# Patient Record
Sex: Female | Born: 1965
Health system: Southern US, Community
[De-identification: ages and names within clinical notes are randomized; demographics above are authoritative.]

## PROBLEM LIST (undated history)

## (undated) ENCOUNTER — Ambulatory Visit: Source: Home / Self Care

## (undated) DIAGNOSIS — O24419 Gestational diabetes mellitus in pregnancy, unspecified control: Secondary | ICD-10-CM

## (undated) DIAGNOSIS — Z8759 Personal history of other complications of pregnancy, childbirth and the puerperium: Secondary | ICD-10-CM

## (undated) DIAGNOSIS — E119 Type 2 diabetes mellitus without complications: Secondary | ICD-10-CM

## (undated) DIAGNOSIS — E785 Hyperlipidemia, unspecified: Secondary | ICD-10-CM

## (undated) DIAGNOSIS — I10 Essential (primary) hypertension: Secondary | ICD-10-CM

## (undated) HISTORY — DX: Personal history of other complications of pregnancy, childbirth and the puerperium: Z87.59

## (undated) HISTORY — DX: Essential (primary) hypertension: I10

## (undated) HISTORY — DX: Hyperlipidemia, unspecified: E78.5

## (undated) HISTORY — DX: Type 2 diabetes mellitus without complications: E11.9

## (undated) HISTORY — DX: Gestational diabetes mellitus in pregnancy, unspecified control: O24.419

---

## 1993-12-31 DIAGNOSIS — Z8759 Personal history of other complications of pregnancy, childbirth and the puerperium: Secondary | ICD-10-CM

## 1993-12-31 HISTORY — DX: Personal history of other complications of pregnancy, childbirth and the puerperium: Z87.59

## 1998-12-31 HISTORY — PX: CERVICAL BIOPSY: SHX590

## 1999-08-10 ENCOUNTER — Other Ambulatory Visit: Admission: RE | Admit: 1999-08-10 | Discharge: 1999-08-10 | Payer: Self-pay | Admitting: Obstetrics & Gynecology

## 1999-10-26 ENCOUNTER — Other Ambulatory Visit: Admission: RE | Admit: 1999-10-26 | Discharge: 1999-10-26 | Payer: Self-pay | Admitting: Obstetrics & Gynecology

## 1999-10-26 ENCOUNTER — Encounter (INDEPENDENT_AMBULATORY_CARE_PROVIDER_SITE_OTHER): Payer: Self-pay

## 2000-01-01 DIAGNOSIS — O24419 Gestational diabetes mellitus in pregnancy, unspecified control: Secondary | ICD-10-CM

## 2000-01-01 HISTORY — DX: Gestational diabetes mellitus in pregnancy, unspecified control: O24.419

## 2000-01-01 HISTORY — PX: TUBAL LIGATION: SHX77

## 2000-04-16 ENCOUNTER — Encounter: Admission: RE | Admit: 2000-04-16 | Discharge: 2000-07-15 | Payer: Self-pay | Admitting: Obstetrics & Gynecology

## 2000-04-17 ENCOUNTER — Encounter: Payer: Self-pay | Admitting: Obstetrics & Gynecology

## 2000-04-17 ENCOUNTER — Inpatient Hospital Stay (HOSPITAL_COMMUNITY): Admission: AD | Admit: 2000-04-17 | Discharge: 2000-04-17 | Payer: Self-pay | Admitting: Anesthesiology

## 2000-05-13 ENCOUNTER — Encounter: Payer: Self-pay | Admitting: Obstetrics & Gynecology

## 2000-05-13 ENCOUNTER — Observation Stay (HOSPITAL_COMMUNITY): Admission: AD | Admit: 2000-05-13 | Discharge: 2000-05-14 | Payer: Self-pay | Admitting: Obstetrics & Gynecology

## 2000-05-17 ENCOUNTER — Inpatient Hospital Stay (HOSPITAL_COMMUNITY): Admission: AD | Admit: 2000-05-17 | Discharge: 2000-05-17 | Payer: Self-pay | Admitting: Obstetrics & Gynecology

## 2000-05-20 ENCOUNTER — Inpatient Hospital Stay (HOSPITAL_COMMUNITY): Admission: AD | Admit: 2000-05-20 | Discharge: 2000-05-20 | Payer: Self-pay | Admitting: Obstetrics & Gynecology

## 2000-05-23 ENCOUNTER — Inpatient Hospital Stay (HOSPITAL_COMMUNITY): Admission: AD | Admit: 2000-05-23 | Discharge: 2000-05-23 | Payer: Self-pay | Admitting: Obstetrics & Gynecology

## 2000-05-23 ENCOUNTER — Encounter: Payer: Self-pay | Admitting: Obstetrics & Gynecology

## 2000-05-27 ENCOUNTER — Encounter (HOSPITAL_COMMUNITY): Admission: RE | Admit: 2000-05-27 | Discharge: 2000-06-15 | Payer: Self-pay | Admitting: Obstetrics & Gynecology

## 2000-05-31 ENCOUNTER — Inpatient Hospital Stay (HOSPITAL_COMMUNITY): Admission: AD | Admit: 2000-05-31 | Discharge: 2000-05-31 | Payer: Self-pay | Admitting: Obstetrics & Gynecology

## 2000-06-14 ENCOUNTER — Inpatient Hospital Stay (HOSPITAL_COMMUNITY): Admission: AD | Admit: 2000-06-14 | Discharge: 2000-06-16 | Payer: Self-pay | Admitting: Obstetrics and Gynecology

## 2000-06-14 ENCOUNTER — Encounter (INDEPENDENT_AMBULATORY_CARE_PROVIDER_SITE_OTHER): Payer: Self-pay

## 2000-08-26 ENCOUNTER — Other Ambulatory Visit: Admission: RE | Admit: 2000-08-26 | Discharge: 2000-08-26 | Payer: Self-pay | Admitting: Obstetrics & Gynecology

## 2003-04-06 ENCOUNTER — Other Ambulatory Visit: Admission: RE | Admit: 2003-04-06 | Discharge: 2003-04-06 | Payer: Self-pay | Admitting: *Deleted

## 2006-03-25 ENCOUNTER — Ambulatory Visit: Payer: Self-pay | Admitting: Family Medicine

## 2006-04-03 ENCOUNTER — Ambulatory Visit: Payer: Self-pay | Admitting: Family Medicine

## 2006-04-03 ENCOUNTER — Other Ambulatory Visit: Admission: RE | Admit: 2006-04-03 | Discharge: 2006-04-03 | Payer: Self-pay | Admitting: Family Medicine

## 2006-04-03 ENCOUNTER — Encounter: Payer: Self-pay | Admitting: Family Medicine

## 2006-04-09 ENCOUNTER — Encounter: Admission: RE | Admit: 2006-04-09 | Discharge: 2006-04-09 | Payer: Self-pay | Admitting: Family Medicine

## 2006-05-06 ENCOUNTER — Encounter: Admission: RE | Admit: 2006-05-06 | Discharge: 2006-05-06 | Payer: Self-pay | Admitting: Family Medicine

## 2007-02-07 ENCOUNTER — Ambulatory Visit: Payer: Self-pay | Admitting: Family Medicine

## 2007-02-07 LAB — CONVERTED CEMR LAB
BUN: 8 mg/dL (ref 6–23)
Basophils Absolute: 0 10*3/uL (ref 0.0–0.1)
Basophils Relative: 0.7 % (ref 0.0–1.0)
Calcium: 9.5 mg/dL (ref 8.4–10.5)
Chloride: 105 meq/L (ref 96–112)
Creatinine, Ser: 0.8 mg/dL (ref 0.4–1.2)
GFR calc Af Amer: 102 mL/min
GFR calc non Af Amer: 84 mL/min
Glucose, Bld: 82 mg/dL (ref 70–99)
HCT: 38.9 % (ref 36.0–46.0)
Hemoglobin: 13 g/dL (ref 12.0–15.0)
MCV: 75.5 fL — ABNORMAL LOW (ref 78.0–100.0)
Monocytes Absolute: 0.5 10*3/uL (ref 0.2–0.7)
Neutrophils Relative %: 62.2 % (ref 43.0–77.0)
Potassium: 3.9 meq/L (ref 3.5–5.1)
RBC: 5.15 M/uL — ABNORMAL HIGH (ref 3.87–5.11)
RDW: 14.4 % (ref 11.5–14.6)
Sodium: 142 meq/L (ref 135–145)

## 2007-04-24 ENCOUNTER — Other Ambulatory Visit: Admission: RE | Admit: 2007-04-24 | Discharge: 2007-04-24 | Payer: Self-pay | Admitting: Family Medicine

## 2007-04-24 ENCOUNTER — Encounter: Payer: Self-pay | Admitting: Family Medicine

## 2007-04-24 ENCOUNTER — Ambulatory Visit: Payer: Self-pay | Admitting: Family Medicine

## 2007-04-29 ENCOUNTER — Ambulatory Visit: Payer: Self-pay | Admitting: Family Medicine

## 2007-04-29 LAB — CONVERTED CEMR LAB
ALT: 15 units/L (ref 0–40)
Alkaline Phosphatase: 69 units/L (ref 39–117)
BUN: 8 mg/dL (ref 6–23)
CO2: 28 meq/L (ref 19–32)
Calcium: 8.9 mg/dL (ref 8.4–10.5)
Creatinine, Ser: 0.9 mg/dL (ref 0.4–1.2)
GFR calc non Af Amer: 73 mL/min
Hemoglobin: 11.9 g/dL — ABNORMAL LOW (ref 12.0–15.0)
Lymphocytes Relative: 32.9 % (ref 12.0–46.0)
MCHC: 32.9 g/dL (ref 30.0–36.0)
Monocytes Absolute: 0.3 10*3/uL (ref 0.2–0.7)
Monocytes Relative: 7.8 % (ref 3.0–11.0)
Platelets: 244 10*3/uL (ref 150–400)
Sodium: 139 meq/L (ref 135–145)
Total Protein: 6.7 g/dL (ref 6.0–8.3)

## 2007-05-01 LAB — CONVERTED CEMR LAB: Pap Smear: NORMAL

## 2007-05-13 ENCOUNTER — Encounter: Admission: RE | Admit: 2007-05-13 | Discharge: 2007-05-13 | Payer: Self-pay | Admitting: Family Medicine

## 2008-05-13 ENCOUNTER — Encounter: Admission: RE | Admit: 2008-05-13 | Discharge: 2008-05-13 | Payer: Self-pay | Admitting: Family Medicine

## 2008-05-17 ENCOUNTER — Encounter (INDEPENDENT_AMBULATORY_CARE_PROVIDER_SITE_OTHER): Payer: Self-pay | Admitting: *Deleted

## 2008-07-20 ENCOUNTER — Ambulatory Visit: Payer: Self-pay | Admitting: *Deleted

## 2008-07-20 DIAGNOSIS — S300XXA Contusion of lower back and pelvis, initial encounter: Secondary | ICD-10-CM | POA: Insufficient documentation

## 2008-08-27 ENCOUNTER — Encounter: Payer: Self-pay | Admitting: Family Medicine

## 2008-08-27 ENCOUNTER — Ambulatory Visit: Payer: Self-pay | Admitting: Family Medicine

## 2008-08-27 ENCOUNTER — Other Ambulatory Visit: Admission: RE | Admit: 2008-08-27 | Discharge: 2008-08-27 | Payer: Self-pay | Admitting: Family Medicine

## 2008-08-27 DIAGNOSIS — J309 Allergic rhinitis, unspecified: Secondary | ICD-10-CM | POA: Insufficient documentation

## 2008-08-30 ENCOUNTER — Ambulatory Visit: Payer: Self-pay | Admitting: Family Medicine

## 2008-08-30 DIAGNOSIS — J019 Acute sinusitis, unspecified: Secondary | ICD-10-CM | POA: Insufficient documentation

## 2008-08-31 ENCOUNTER — Encounter (INDEPENDENT_AMBULATORY_CARE_PROVIDER_SITE_OTHER): Payer: Self-pay | Admitting: *Deleted

## 2008-09-09 ENCOUNTER — Encounter (INDEPENDENT_AMBULATORY_CARE_PROVIDER_SITE_OTHER): Payer: Self-pay | Admitting: *Deleted

## 2008-09-14 ENCOUNTER — Telehealth (INDEPENDENT_AMBULATORY_CARE_PROVIDER_SITE_OTHER): Payer: Self-pay | Admitting: *Deleted

## 2009-01-25 ENCOUNTER — Ambulatory Visit: Payer: Self-pay | Admitting: Family Medicine

## 2009-01-25 DIAGNOSIS — R059 Cough, unspecified: Secondary | ICD-10-CM | POA: Insufficient documentation

## 2009-01-25 DIAGNOSIS — R05 Cough: Secondary | ICD-10-CM

## 2009-01-26 ENCOUNTER — Ambulatory Visit: Payer: Self-pay | Admitting: Family Medicine

## 2009-01-27 ENCOUNTER — Telehealth (INDEPENDENT_AMBULATORY_CARE_PROVIDER_SITE_OTHER): Payer: Self-pay | Admitting: *Deleted

## 2009-02-25 ENCOUNTER — Ambulatory Visit: Payer: Self-pay | Admitting: Family Medicine

## 2009-02-25 ENCOUNTER — Encounter (INDEPENDENT_AMBULATORY_CARE_PROVIDER_SITE_OTHER): Payer: Self-pay | Admitting: *Deleted

## 2009-03-16 ENCOUNTER — Ambulatory Visit: Payer: Self-pay | Admitting: Pulmonary Disease

## 2009-04-29 ENCOUNTER — Ambulatory Visit: Payer: Self-pay | Admitting: Family Medicine

## 2009-04-29 ENCOUNTER — Encounter: Payer: Self-pay | Admitting: Family Medicine

## 2009-04-29 ENCOUNTER — Other Ambulatory Visit: Admission: RE | Admit: 2009-04-29 | Discharge: 2009-04-29 | Payer: Self-pay | Admitting: Family Medicine

## 2009-05-02 ENCOUNTER — Encounter (INDEPENDENT_AMBULATORY_CARE_PROVIDER_SITE_OTHER): Payer: Self-pay | Admitting: *Deleted

## 2009-05-05 ENCOUNTER — Encounter (INDEPENDENT_AMBULATORY_CARE_PROVIDER_SITE_OTHER): Payer: Self-pay | Admitting: *Deleted

## 2009-06-16 ENCOUNTER — Telehealth (INDEPENDENT_AMBULATORY_CARE_PROVIDER_SITE_OTHER): Payer: Self-pay | Admitting: *Deleted

## 2009-06-17 ENCOUNTER — Encounter: Admission: RE | Admit: 2009-06-17 | Discharge: 2009-06-17 | Payer: Self-pay | Admitting: Family Medicine

## 2009-11-04 ENCOUNTER — Ambulatory Visit: Payer: Self-pay | Admitting: Family Medicine

## 2010-05-16 ENCOUNTER — Ambulatory Visit: Payer: Self-pay | Admitting: Family Medicine

## 2010-05-16 DIAGNOSIS — F4310 Post-traumatic stress disorder, unspecified: Secondary | ICD-10-CM | POA: Insufficient documentation

## 2010-06-01 ENCOUNTER — Encounter (INDEPENDENT_AMBULATORY_CARE_PROVIDER_SITE_OTHER): Payer: Self-pay | Admitting: *Deleted

## 2010-06-01 ENCOUNTER — Ambulatory Visit: Payer: Self-pay | Admitting: Family Medicine

## 2010-06-19 ENCOUNTER — Ambulatory Visit: Payer: Self-pay | Admitting: Family Medicine

## 2010-06-19 ENCOUNTER — Encounter: Admission: RE | Admit: 2010-06-19 | Discharge: 2010-06-19 | Payer: Self-pay | Admitting: Family Medicine

## 2010-06-19 ENCOUNTER — Other Ambulatory Visit: Admission: RE | Admit: 2010-06-19 | Discharge: 2010-06-19 | Payer: Self-pay | Admitting: Family Medicine

## 2010-06-19 DIAGNOSIS — R7309 Other abnormal glucose: Secondary | ICD-10-CM | POA: Insufficient documentation

## 2010-06-19 DIAGNOSIS — E1165 Type 2 diabetes mellitus with hyperglycemia: Secondary | ICD-10-CM | POA: Insufficient documentation

## 2010-06-19 LAB — CONVERTED CEMR LAB: Hgb A1c MFr Bld: 7.4 % — ABNORMAL HIGH (ref 4.6–6.5)

## 2010-06-20 ENCOUNTER — Ambulatory Visit: Payer: Self-pay | Admitting: Family Medicine

## 2010-06-22 ENCOUNTER — Encounter (INDEPENDENT_AMBULATORY_CARE_PROVIDER_SITE_OTHER): Payer: Self-pay | Admitting: *Deleted

## 2010-06-22 ENCOUNTER — Telehealth (INDEPENDENT_AMBULATORY_CARE_PROVIDER_SITE_OTHER): Payer: Self-pay | Admitting: *Deleted

## 2010-06-22 LAB — CONVERTED CEMR LAB: Pap Smear: NEGATIVE

## 2010-06-28 ENCOUNTER — Telehealth (INDEPENDENT_AMBULATORY_CARE_PROVIDER_SITE_OTHER): Payer: Self-pay | Admitting: *Deleted

## 2010-07-23 ENCOUNTER — Emergency Department (HOSPITAL_BASED_OUTPATIENT_CLINIC_OR_DEPARTMENT_OTHER): Admission: EM | Admit: 2010-07-23 | Discharge: 2010-07-23 | Payer: Self-pay | Admitting: Emergency Medicine

## 2010-07-24 ENCOUNTER — Ambulatory Visit: Payer: Self-pay | Admitting: Family Medicine

## 2010-09-21 ENCOUNTER — Ambulatory Visit: Payer: Self-pay | Admitting: Family Medicine

## 2010-09-21 DIAGNOSIS — I10 Essential (primary) hypertension: Secondary | ICD-10-CM | POA: Insufficient documentation

## 2010-09-22 LAB — CONVERTED CEMR LAB
AST: 21 units/L (ref 0–37)
Alkaline Phosphatase: 85 units/L (ref 39–117)
BUN: 10 mg/dL (ref 6–23)
Bilirubin, Direct: 0.1 mg/dL (ref 0.0–0.3)
CO2: 24 meq/L (ref 19–32)
Calcium: 9.5 mg/dL (ref 8.4–10.5)
Cholesterol: 262 mg/dL — ABNORMAL HIGH (ref 0–200)
Creatinine, Ser: 1 mg/dL (ref 0.4–1.2)
Direct LDL: 207.4 mg/dL
HDL: 51.9 mg/dL (ref >39.00)
Microalb, Ur: 2.2 mg/dL — ABNORMAL HIGH (ref 0.0–1.9)
Potassium: 4.4 meq/L (ref 3.5–5.1)
Sodium: 142 meq/L (ref 135–145)
Total Bilirubin: 0.1 mg/dL — ABNORMAL LOW (ref 0.3–1.2)
Total CHOL/HDL Ratio: 5
Total Protein: 7.3 g/dL (ref 6.0–8.3)
Triglycerides: 91 mg/dL (ref 0.0–149.0)
VLDL: 18.2 mg/dL (ref 0.0–40.0)

## 2010-09-25 ENCOUNTER — Telehealth (INDEPENDENT_AMBULATORY_CARE_PROVIDER_SITE_OTHER): Payer: Self-pay | Admitting: *Deleted

## 2010-09-29 ENCOUNTER — Telehealth: Payer: Self-pay | Admitting: Family Medicine

## 2010-10-16 ENCOUNTER — Telehealth: Payer: Self-pay | Admitting: Family Medicine

## 2010-11-08 ENCOUNTER — Telehealth (INDEPENDENT_AMBULATORY_CARE_PROVIDER_SITE_OTHER): Payer: Self-pay | Admitting: *Deleted

## 2010-11-21 ENCOUNTER — Encounter: Payer: Self-pay | Admitting: Family Medicine

## 2010-12-19 ENCOUNTER — Ambulatory Visit: Payer: Self-pay | Admitting: Family Medicine

## 2010-12-19 DIAGNOSIS — J069 Acute upper respiratory infection, unspecified: Secondary | ICD-10-CM | POA: Insufficient documentation

## 2010-12-19 LAB — HM DIABETES FOOT EXAM

## 2010-12-26 ENCOUNTER — Telehealth (INDEPENDENT_AMBULATORY_CARE_PROVIDER_SITE_OTHER): Payer: Self-pay | Admitting: *Deleted

## 2010-12-26 LAB — CONVERTED CEMR LAB
Albumin: 3.9 g/dL (ref 3.5–5.2)
Cholesterol: 177 mg/dL (ref 0–200)
Creatinine, Ser: 0.9 mg/dL (ref 0.4–1.2)
Creatinine,U: 145.6 mg/dL
GFR calc non Af Amer: 87.14 mL/min (ref >60.00)
Glucose, Bld: 143 mg/dL — ABNORMAL HIGH (ref 70–99)
HDL: 50.6 mg/dL (ref >39.00)
Hgb A1c MFr Bld: 7.5 % — ABNORMAL HIGH (ref 4.6–6.5)
Microalb Creat Ratio: 0.3 mg/g (ref 0.0–30.0)
Potassium: 4.3 meq/L (ref 3.5–5.1)
Total Bilirubin: 0.5 mg/dL (ref 0.3–1.2)

## 2010-12-27 ENCOUNTER — Ambulatory Visit
Admission: RE | Admit: 2010-12-27 | Discharge: 2010-12-27 | Payer: Self-pay | Source: Home / Self Care | Attending: Internal Medicine | Admitting: Internal Medicine

## 2010-12-27 ENCOUNTER — Ambulatory Visit (HOSPITAL_BASED_OUTPATIENT_CLINIC_OR_DEPARTMENT_OTHER)
Admission: RE | Admit: 2010-12-27 | Discharge: 2010-12-27 | Payer: Self-pay | Source: Home / Self Care | Attending: Internal Medicine | Admitting: Internal Medicine

## 2011-01-09 ENCOUNTER — Ambulatory Visit
Admission: RE | Admit: 2011-01-09 | Discharge: 2011-01-09 | Payer: Self-pay | Source: Home / Self Care | Attending: Family Medicine | Admitting: Family Medicine

## 2011-01-09 DIAGNOSIS — L02519 Cutaneous abscess of unspecified hand: Secondary | ICD-10-CM | POA: Insufficient documentation

## 2011-01-09 DIAGNOSIS — L03019 Cellulitis of unspecified finger: Secondary | ICD-10-CM

## 2011-01-21 ENCOUNTER — Encounter: Payer: Self-pay | Admitting: Family Medicine

## 2011-01-22 ENCOUNTER — Encounter: Payer: Self-pay | Admitting: Family Medicine

## 2011-01-28 LAB — CONVERTED CEMR LAB
ALT: 20 units/L (ref 0–35)
ALT: 22 units/L (ref 0–35)
AST: 22 units/L (ref 0–37)
Alkaline Phosphatase: 86 units/L (ref 39–117)
Alkaline Phosphatase: 87 units/L (ref 39–117)
BUN: 10 mg/dL (ref 6–23)
BUN: 11 mg/dL (ref 6–23)
Basophils Relative: 0.5 % (ref 0.0–3.0)
Basophils Relative: 0.9 % (ref 0.0–3.0)
Bilirubin, Direct: 0 mg/dL (ref 0.0–0.3)
CO2: 28 meq/L (ref 19–32)
Calcium: 9.4 mg/dL (ref 8.4–10.5)
Chloride: 105 meq/L (ref 96–112)
Cholesterol: 235 mg/dL — ABNORMAL HIGH (ref 0–200)
Cholesterol: 246 mg/dL (ref 0–200)
Creatinine, Ser: 0.8 mg/dL (ref 0.4–1.2)
Direct LDL: 165.8 mg/dL
Direct LDL: 173.3 mg/dL
Direct LDL: 192 mg/dL
Eosinophils Absolute: 0.2 10*3/uL (ref 0.0–0.7)
Eosinophils Relative: 2.4 % (ref 0.0–5.0)
GFR calc Af Amer: 88 mL/min
GFR calc non Af Amer: 73 mL/min
Glucose, Bld: 109 mg/dL — ABNORMAL HIGH (ref 70–99)
Glucose, Urine, Semiquant: NEGATIVE
HCT: 38.4 % (ref 36.0–46.0)
HDL: 42.9 mg/dL (ref >39.0)
HDL: 55.8 mg/dL (ref >39.00)
Hemoglobin: 12.7 g/dL (ref 12.0–15.0)
Hemoglobin: 12.8 g/dL (ref 12.0–15.0)
Hemoglobin: 13.3 g/dL (ref 12.0–15.0)
Lymphocytes Relative: 31.4 % (ref 12.0–46.0)
MCHC: 33.4 g/dL (ref 30.0–36.0)
MCV: 77 fL — ABNORMAL LOW (ref 78.0–100.0)
MCV: 77.6 fL — ABNORMAL LOW (ref 78.0–100.0)
Monocytes Absolute: 0.5 10*3/uL (ref 0.1–1.0)
Monocytes Relative: 14.6 % — ABNORMAL HIGH (ref 3.0–12.0)
Monocytes Relative: 8.9 % (ref 3.0–12.0)
Neutro Abs: 2 10*3/uL (ref 1.4–7.7)
Neutro Abs: 3.6 10*3/uL (ref 1.4–7.7)
Nitrite: NEGATIVE
Platelets: 209 10*3/uL (ref 150.0–400.0)
Platelets: 242 10*3/uL (ref 150.0–400.0)
Potassium: 4.1 meq/L (ref 3.5–5.1)
Potassium: 4.1 meq/L (ref 3.5–5.1)
Potassium: 4.2 meq/L (ref 3.5–5.1)
Protein, U semiquant: NEGATIVE
RBC: 4.97 M/uL (ref 3.87–5.11)
RBC: 4.99 M/uL (ref 3.87–5.11)
Sodium: 138 meq/L (ref 135–145)
Specific Gravity, Urine: 1.015
TSH: 2.02 microintl units/mL (ref 0.35–5.50)
TSH: 2.36 microintl units/mL (ref 0.35–5.50)
Total Bilirubin: 0.7 mg/dL (ref 0.3–1.2)
Total CHOL/HDL Ratio: 4
Total Protein: 7.4 g/dL (ref 6.0–8.3)
Total Protein: 7.5 g/dL (ref 6.0–8.3)
Triglycerides: 109 mg/dL (ref 0.0–149.0)
Triglycerides: 110 mg/dL (ref 0.0–149.0)
VLDL: 21.8 mg/dL (ref 0.0–40.0)
VLDL: 22 mg/dL (ref 0–40)
WBC: 6.4 10*3/uL (ref 4.5–10.5)
pH: 6.5

## 2011-01-30 NOTE — Letter (Signed)
Summary: [facility]: Immunoassay Fecal Occult Blood (iFOB) Order Form  [facility] [place]  [address] [place], [place] [address]   Phone: [phone]  Fax: [phone]      [facility]: Immunoassay Fecal Occult Blood (iFOB) Order Form   June 01, 2010 MRN: [id number]   Paige Fleming January 11, 1966   Physicican Name:______yvonne lowne,do___________________  Diagnosis Code:_______v76.51___________________      Army Fossa CMA

## 2011-01-30 NOTE — Letter (Signed)
Summary: [facility]  [facility]   Imported By: Lanelle Bal 12/01/2010 09:08:51  _____________________________________________________________________  External Attachment:    Type:   Image     Comment:   External Document  Appended Document: [facility]     Allergies: No Known Drug Allergies  Diabetes Management Exam:    Eye Exam:       Eye Exam done elsewhere          Date: 11/21/2010          Results: normal          Done by: [facility]   Complete Medication List: 1)  Zyrtec Allergy 10 Mg Tabs (Cetirizine hcl) .... As needed. 2)  Optivar 0.05 % Soln (Azelastine hcl) .Marland Kitchen.. 1 gtt two times a day as needed 3)  Astepro 0.15 % Soln (Azelastine hcl) 4)  Onetouch Delica Lancets Misc (Lancets) .... Accu check once daily 5)  One Touch Ultra Mini Strips  .... Accu check once daily 6)  Singulair 10 Mg Tabs (Montelukast sodium) .... Take one tablet daily as needed. 7)  Lisinopril 10 Mg Tabs (Lisinopril) .Marland Kitchen.. 1 by mouth once daily 8)  Glucophage Xr 500 Mg Xr24h-tab (Metformin hcl) .Marland Kitchen.. 1 by mouth at bedtime 9)  Crestor 10 Mg Tabs (Rosuvastatin calcium) .Marland Kitchen.. 1 by mouth daily at bedtime 10)  Flonase 50 Mcg/act Susp (Fluticasone propionate) .... 2 sprays in each nostril once daily

## 2011-01-30 NOTE — Assessment & Plan Note (Signed)
Summary: rto 3 months- lab/cbs   Vital Signs:  Patient profile:   45 year old female Height:      64 inches Weight:      172.6 pounds Temp:     98.2 degrees F oral Pulse rate:   84 / minute Pulse rhythm:   regular BP sitting:   130 / 88  (right arm)  Vitals Entered By: Almeta Monas CMA Duncan Dull) (September 21, 2010 8:27 AM) CC: 3 mo f/u with fasting labs   History of Present Illness:  Hypertension follow-up      This is a 45 year old woman who presents for Hypertension follow-up.  The patient denies lightheadedness, urinary frequency, headaches, edema, impotence, rash, and fatigue.  The patient denies the following associated symptoms: chest pain, chest pressure, exercise intolerance, dyspnea, palpitations, syncope, leg edema, and pedal edema.  Compliance with medications (by patient report) has been near 100%.  The patient reports that dietary compliance has been good.  The patient reports exercising 3-4X per week.  Adjunctive measures currently used by the patient include salt restriction.    Type 1 diabetes mellitus follow-up      The patient is also here for Type 2 diabetes mellitus follow-up.  The patient denies polyuria, polydipsia, blurred vision, self managed hypoglycemia, hypoglycemia requiring help, weight loss, weight gain, and numbness of extremities.  The patient denies the following symptoms: neuropathic pain, chest pain, vomiting, orthostatic symptoms, poor wound healing, intermittent claudication, vision loss, and foot ulcer.  Since the last visit the patient reports good dietary compliance, noncompliance with medications, exercising regularly, and monitoring blood glucose.  The patient has been measuring capillary blood glucose before breakfast and after dinner.  Since the last visit, the patient reports having had eye care by an ophthalmologist.    Current Medications (verified): 1)  Zyrtec Allergy 10 Mg Tabs (Cetirizine Hcl) .... As Needed. 2)  Optivar 0.05 % Soln  (Azelastine Hcl) .Marland Kitchen.. 1 Gtt Two Times A Day As Needed 3)  Astepro 0.15 % Soln (Azelastine Hcl) 4)  Onetouch Delica Lancets  Misc (Lancets) .... Accu Check Once Daily 5)  One Touch Ultra Mini Strips .... Accu Check Once Daily 6)  Singulair 10 Mg Tabs (Montelukast Sodium) .... Take One Tablet Daily As Needed. 7)  Lisinopril 10 Mg Tabs (Lisinopril) .Marland Kitchen.. 1 By Mouth Once Daily  Allergies (verified): No Known Drug Allergies  Past History:  Past medical, surgical, family and social histories (including risk factors) reviewed for relevance to current acute and chronic problems.  Past Medical History: Reviewed history from 04/29/2009 and no changes required. 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 Tubal ligation 2001 cervical biopsy 2000  Family History: Reviewed history from 06/01/2010 and no changes required. Family History Hypertension - mother, maternal grandmother, maternal aunts Crohns diseas - twin sister Arthritis - mother, maternal grandmother Family History Breast cancer - maternal cousin Diabetes - Father, paternal aunts allergies: children asthma: children Family History Breast cancer 1st degree relative 66yo--mom  Social History: Reviewed history from 03/16/2009 and no changes required. 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 Lungs:  Normal respiratory effort, chest expands symmetrically. Lungs are clear to auscultation, no crackles or wheezes. Heart:  normal rate and no murmur.   Extremities:  No clubbing, cyanosis, edema, or deformity noted with normal full range of motion of all joints.   Psych:  Cognition and judgment appear intact. Alert and cooperative with normal attention span and concentration. No apparent delusions, illusions, hallucinations  Diabetes Management Exam:    Foot Exam (with socks and/or shoes not present):       Sensory-Pinprick/Light touch:          Left medial foot (L-4): normal          Left dorsal foot (L-5): normal          Left lateral foot (S-1): normal          Right medial foot (L-4): normal          Right dorsal foot (L-5): normal          Right lateral foot (S-1): normal       Sensory-Monofilament:          Left foot: normal          Right foot: normal       Inspection:          Left foot: normal          Right foot: normal       Nails:          Left foot: normal          Right foot: normal    Eye Exam:       Eye Exam done elsewhere          Results: normal          Done by: optho   Impression & Recommendations:  Problem # 1:  DIABETES MELLITUS, TYPE II (ICD-250.00)  The following medications were removed from the medication list:    Glucophage Xr 500 Mg Xr24h-tab (Metformin hcl) .Marland Kitchen... 1 by mouth at bedtime Her updated medication list for this problem includes:    Lisinopril 10 Mg Tabs (Lisinopril) .Marland Kitchen... 1 by mouth once daily  Orders: Venipuncture (38756) TLB-Lipid Panel (80061-LIPID) TLB-BMP (Basic Metabolic Panel-BMET) (80048-METABOL) TLB-Hepatic/Liver Function Pnl (80076-HEPATIC) TLB-A1C / Hgb A1C (Glycohemoglobin) (83036-A1C) TLB-Microalbumin/Creat Ratio, Urine (82043-MALB)  Labs Reviewed: Creat: 0.8 (06/01/2010)    Reviewed HgBA1c results: 7.4 (06/19/2010)  Problem # 2:  ESSENTIAL HYPERTENSION (ICD-401.9) Assessment: New  Her  updated medication list for this problem includes:    Lisinopril 10 Mg Tabs (Lisinopril) .Marland Kitchen... 1 by mouth once daily  Orders: Venipuncture (43329) TLB-Lipid Panel (80061-LIPID) TLB-BMP (Basic Metabolic Panel-BMET) (80048-METABOL) TLB-Hepatic/Liver Function Pnl (80076-HEPATIC) TLB-A1C / Hgb A1C (Glycohemoglobin) (83036-A1C) TLB-Microalbumin/Creat Ratio, Urine (82043-MALB)  BP today: 130/88 Prior BP: 120/80 (06/20/2010)  Labs Reviewed: K+: 4.2 (06/01/2010) Creat: : 0.8 (06/01/2010)   Chol: 235 (06/01/2010)   HDL: 55.80 (06/01/2010)   LDL: DEL (08/27/2008)   TG: 110.0 (06/01/2010)  Complete Medication List: 1)  Zyrtec Allergy 10 Mg Tabs (Cetirizine hcl) .... As needed. 2)  Optivar 0.05 % Soln (Azelastine hcl) .Marland Kitchen.. 1 gtt two times a day as needed 3)  Astepro 0.15 % Soln (Azelastine hcl) 4)  Onetouch Delica Lancets Misc (Lancets) .... Accu check once daily 5)  One Touch Ultra Mini Strips  .... Accu check once daily 6)  Singulair 10 Mg Tabs (Montelukast sodium) .... Take one tablet  daily as needed. 7)  Lisinopril 10 Mg Tabs (Lisinopril) .Marland Kitchen.. 1 by mouth once daily Prescriptions: LISINOPRIL 10 MG TABS (LISINOPRIL) 1 by mouth once daily  #30 x 2   Entered and Authorized by:   Loreen Freud DO   Signed by:   Loreen Freud DO on 09/21/2010   Method used:   [facility]  [address] [id number]       [address]       [place], [place]  [address]       Ph: [phone] or [phone]       Fax: [phone]   RxID:   [id number]

## 2011-01-30 NOTE — Progress Notes (Signed)
Summary: refill  Phone Note Refill Request   Refills Requested: Medication #1:  ONETOUCH DELICA LANCETS  MISC accu check once daily [facility] - patient [facility] be [facility] lancet [facility]   Initial call taken by: Okey Regal [place],  June 28, 2010 11:30 AM    Prescriptions: Letta Pate DELICA LANCETS  MISC (LANCETS) accu check once daily  #1 box x 11   Entered by:   Shonna Chock   Authorized by:   Loreen Freud DO   Signed by:   Shonna Chock on 06/28/2010   Method used:   [facility] ...       [facility]  [address] [id number]       [address]       [place], [place]  [address]       Ph: [phone] or [phone]       Fax: [phone]   RxID:   [id number]

## 2011-01-30 NOTE — Assessment & Plan Note (Signed)
Summary: discuss dm/drb   Vital Signs:  Patient profile:   45 year old female Weight:      175 pounds Pulse rate:   86 / minute Pulse rhythm:   regular BP sitting:   120 / 80  (left arm) Cuff size:   regular  Vitals Entered By: Army Fossa CMA (June 20, 2010 3:29 PM) CC: discuss dm   History of Present Illness: Pt here to review labs and get glucometer.   no complaints.  Current Medications (verified): 1)  Zyrtec Allergy 10 Mg Tabs (Cetirizine Hcl) .... As Needed. 2)  Alprazolam 0.25 Mg Tabs (Alprazolam) .Marland Kitchen.. 1 By Mouth Three Times A Day As Needed 3)  Optivar 0.05 % Soln (Azelastine Hcl) .Marland Kitchen.. 1 Gtt Two Times A Day As Needed 4)  Astepro 0.15 % Soln (Azelastine Hcl) 5)  Onetouch Delica Lancets  Misc (Lancets) .... Accu Check Once Daily 6)  One Touch Ultra Mini Strips .... Accu Check Once Daily 7)  Glucophage Xr 500 Mg Xr24h-Tab (Metformin Hcl) .Marland Kitchen.. 1 By Mouth At Bedtime  Allergies (verified): No Known Drug Allergies  Past History:  Past medical, surgical, family and social histories (including risk factors) reviewed for relevance to current acute and chronic problems.  Past Medical History: Reviewed history from 04/29/2009 and no changes required. 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 Tubal ligation 2001 cervical biopsy 2000  Family History: Reviewed history from 06/01/2010 and no changes required. Family History Hypertension - mother, maternal grandmother, maternal aunts Crohns diseas - twin sister Arthritis - mother, maternal grandmother Family History Breast cancer - maternal cousin Diabetes - Father, paternal  aunts allergies: children asthma: children Family History Breast cancer 1st degree relative 66yo--mom  Social History: Reviewed history from 03/16/2009 and no changes required. 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     Impression & Recommendations:  Problem # 1:  DIABETES MELLITUS, TYPE II (ICD-250.00)  Her updated medication list for this problem includes:    Glucophage Xr 500 Mg Xr24h-tab (Metformin hcl) .Marland Kitchen... 1 by mouth at bedtime  Orders: Nutrition Referral (Nutrition)  Labs Reviewed: Creat: 0.8 (06/01/2010)    Reviewed HgBA1c results: 7.4 (06/19/2010)  Complete Medication List: 1)  Zyrtec Allergy 10 Mg Tabs (Cetirizine hcl) .... As needed. 2)  Alprazolam 0.25 Mg Tabs (Alprazolam) .Marland Kitchen.. 1 by mouth three times a day as needed 3)  Optivar 0.05 % Soln (Azelastine hcl) .Marland Kitchen.. 1 gtt two times a day as needed 4)  Astepro 0.15 % Soln (Azelastine hcl) 5)  Onetouch Delica Lancets Misc (Lancets) .... Accu check once daily 6)  One Touch Ultra Mini Strips  .... Accu check once daily 7)  Glucophage Xr 500 Mg Xr24h-tab (Metformin hcl) .Marland Kitchen.. 1 by mouth at bedtime  Patient Instructions: 1)  rto labs 3 months  250.00,   272.4   lipids , hep, hgba1c, bmp  microalbumin Prescriptions: GLUCOPHAGE XR 500 MG XR24H-TAB (METFORMIN HCL) 1 by mouth at bedtime  #30 x 2   Entered and Authorized by:  Loreen Freud DO   Signed by:   Loreen Freud DO on 06/20/2010   Method used:   [facility]        [id number]       [address]       [place], [place]  [address]       Ph: [phone] or [phone]       Fax: [phone]   RxID:    [id number] ONE TOUCH ULTRA MINI STRIPS accu check once daily  #1 box x 11   Entered and Authorized by:   Loreen Freud DO   Signed by:   Loreen Freud DO on 06/20/2010   Method used:   [facility]ed to ...       [id number]       [address]       [place], [place]  [address]       Ph: [phone] or [phone]       Fax: [phone]   RxID:   [id number] Dola Argyle LANCETS  MISC (LANCETS) accu check once daily  #1 box x 11   Entered and Authorized by:   Loreen Freud DO   Signed by:   Loreen Freud DO on 06/20/2010   Method used:   [facility]        [facility]  [id number]       [address]       [place], [place]  [address]       Ph: [phone] or [phone]       Fax: [phone]   RxID:   [id number]

## 2011-01-30 NOTE — Progress Notes (Signed)
Summary: New Meds       New/Updated Medications: GLUCOPHAGE XR 500 MG XR24H-TAB (METFORMIN HCL) 1 by mouth at bedtime CRESTOR 10 MG TABS (ROSUVASTATIN CALCIUM) 1 by mouth daily at bedtime Prescriptions: CRESTOR 10 MG TABS (ROSUVASTATIN CALCIUM) 1 by mouth daily at bedtime  #30 x 2   Entered by:   Almeta Monas CMA (AAMA)   Authorized by:   Loreen Freud DO   Signed by:   Almeta Monas CMA (AAMA) on 09/25/2010   Method used:   [facility]ed to ...       [id number]       [address]       [place], [place]  [address]       Ph: [phone] or [phone]       Fax: [phone]   RxID:   [id number] GLUCOPHAGE XR 500 MG XR24H-TAB (METFORMIN HCL) 1 by mouth at bedtime  #30 x 2   Entered by:   Almeta Monas CMA (AAMA)   Authorized by:   Loreen Freud DO   Signed by:   Almeta Monas CMA (AAMA) on 09/25/2010   Method used:   [facility]ed to ...       [id number]       [address]       [place], [place]  [address]       Ph: [phone] or [phone]       Fax: [phone]   RxID:   [id number]

## 2011-01-30 NOTE — Progress Notes (Signed)
Summary: Rx ?'s  Phone Note Call from Patient Call back at Home Phone [phone]   Caller: Patient Summary of Call: Patient called in to find out how many rx's and for [facility]   Call back #: [phone] Initial call taken by: Harold Barban,  June 22, 2010 10:36 AM  Follow-up for Phone Call        lmtcb for more information. Follow-up by: Harold Barban,  June 22, 2010 10:36 AM  Additional Follow-up for Phone Call Additional follow up Details #1::        Spoke with patient and [facility]. She left the pharmacy and got home after they closed and realized she didnt have the test stripes or the metformin. So she called them back and they said thet never recieved the other rx's. Will inform nurse to re-send the other 2 rx's. Patient was ok with that.  Additional Follow-up by: Harold Barban,  June 22, 2010 2:16 PM    Prescriptions: GLUCOPHAGE XR 500 MG XR24H-TAB (METFORMIN HCL) 1 by mouth at bedtime  #30 x 2   Entered by:   Army Fossa CMA   Authorized by:   Loreen Freud DO   Signed by:   Army Fossa CMA on 06/22/2010   Method used:   [facility]        [facility] [id number]       [address]       [place], [place]  [address]       Ph: [phone] or [phone]       Fax: [phone]   RxID:   [id number] ONE TOUCH ULTRA MINI STRIPS accu check once daily  #1 box x 11   Entered by:   Army Fossa CMA   Authorized by:   Loreen Freud DO   Signed by:   Army Fossa CMA on 06/22/2010   Method used:   [facility]ed to ...       [id number]       [address]       [place], [place]  [address]       Ph: [phone] or [phone]       Fax: [phone]   RxID:   [id number]
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
## 2011-01-30 NOTE — Progress Notes (Signed)
Summary: FYI: Meds causing Diarrhea 9/30  Phone Note Call from Patient   Caller: Patient Call For: Loreen Freud DO Details for Reason: FYI: Meds causing Diarhhea Summary of Call: Spk with pt and she stated she started Crestor, Glucophage and Lisinopril on Wednesday, pt takes 2 at bedtime and in the morning when she wakes up in the morning she has diarrhea and dry mouth, per pt did not have the diarrhea all day only when she gets up, except for today (she has been to the bathroom 3 times already). States she will wait to see if it gets better over the weekend and if no she will call on Monday and let us know. Advised pt I will get this information to Dr.Lowne and see if she recommends anything. Advised in the meantime to try to stay hydrated, adv pt to try ginger ale, gatorade and bland light meals to see if any improvement. Please advise if there is anything different you would like for the pt to do. Initial call taken by: Almeta Monas CMA Duncan Dull),  September 29, 2010 12:12 PM  Follow-up for Phone Call        probably metformin causing diarrhea---it may improve Follow-up by: Loreen Freud DO,  September 29, 2010 1:55 PM  Additional Follow-up for Phone Call Additional follow up Details #1::        Left message to call back Katelynd Blauvelt CMA Duncan Dull)  September 29, 2010 4:16 PM     Additional Follow-up for Phone Call Additional follow up Details #2::    Called to check on patient and she made me aware that the diarrhea did cease but she continues to have dry mouth. Patient states that she indeed does feel much better.  Follow-up by: Lucious Groves CMA,  October 03, 2010 3:59 PM

## 2011-01-30 NOTE — Assessment & Plan Note (Signed)
Summary: FOR PAP ONLY--PH   Vital Signs:  Patient profile:   45 year old female Weight:      175.38 pounds Pulse rate:   84 / minute Pulse rhythm:   regular BP sitting:   118 / 80  (left arm) Cuff size:   regular  Vitals Entered By: Army Fossa CMA (June 19, 2010 10:03 AM) CC: Pt here for pap only   History of Present Illness: Pt here for pap only.  No complaints.    Current Medications (verified): 1)  Zyrtec Allergy 10 Mg Tabs (Cetirizine Hcl) .... As Needed. 2)  Alprazolam 0.25 Mg Tabs (Alprazolam) .Marland Kitchen.. 1 By Mouth Three Times A Day As Needed 3)  Optivar 0.05 % Soln (Azelastine Hcl) .Marland Kitchen.. 1 Gtt Two Times A Day As Needed 4)  Astepro 0.15 % Soln (Azelastine Hcl)  Allergies (verified): No Known Drug Allergies  Past History:  Past medical, surgical, family and social histories (including risk factors) reviewed for relevance to current acute and chronic problems.  Past Medical History: Reviewed history from 04/29/2009 and no changes required. 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 Tubal ligation 2001 cervical biopsy 2000  Family History: Reviewed history from 06/01/2010 and no changes required. Family History Hypertension - mother, maternal grandmother, maternal aunts Crohns diseas - twin sister Arthritis - mother, maternal grandmother Family History Breast cancer - maternal cousin Diabetes - Father, paternal aunts allergies: children asthma: children Family History Breast cancer 1st degree relative 66yo--mom  Social History: Reviewed history from 03/16/2009 and no changes required. 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     Impression & Recommendations:  Problem # 1:  ROUTINE GYNECOLOGICAL EXAMINATION (ICD-V72.31) pap done today   Complete Medication List: 1)  Zyrtec Allergy 10 Mg Tabs (Cetirizine hcl) .... As needed. 2)  Alprazolam 0.25 Mg Tabs (Alprazolam) .Marland Kitchen.. 1 by mouth three times a day as needed 3)  Optivar 0.05 % Soln (Azelastine hcl) .Marland Kitchen.. 1 gtt two times a day as needed 4)  Astepro 0.15 % Soln (Azelastine hcl)  Other Orders: Venipuncture (16109) TLB-A1C / Hgb A1C (Glycohemoglobin) (83036-A1C)

## 2011-01-30 NOTE — Progress Notes (Signed)
Summary: Congestion Issues  Phone Note Call from Patient   Caller: Patient Details for Reason: Congested Summary of Call: Call from pt stating that she is  having nasal congestion, using astepro, saline drops and it is not working. Pt wasn't sure about taking Mucinex because she is worried about interactions with other meds. Pt stated that she got worst over the weekend wants to know if we can call something in or can she get a RX for the mucinex-d so she can use her FSA? c/b # [id number]. Initial call taken by: Almeta Monas CMA Duncan Dull),  October 16, 2010 9:28 AM  Follow-up for Phone Call        she can not use mucinex D because of HTN----  she can use astepro--- with Flonase ( we can call in) 2 sprays each nostril once daily ----use claritin , allegra or zyrtec-----  ov if no better in 1 week or worsens Follow-up by: Loreen Freud DO,  October 16, 2010 10:39 AM  Additional Follow-up for Phone Call Additional follow up Details #1::        left a mssg giving pt the above direction per pt request due to being in a meeting and advised to call with any questions... Amayrani Bennick CMA Duncan Dull)  October 16, 2010 11:55 AM     New/Updated Medications: FLONASE 50 MCG/ACT SUSP (FLUTICASONE PROPIONATE) 2 sprays in each nostril once daily Prescriptions: FLONASE 50 MCG/ACT SUSP (FLUTICASONE PROPIONATE) 2 sprays in each nostril once daily  #1 x 1   Entered by:   Almeta Monas CMA (AAMA)   Authorized by:   Loreen Freud DO   Signed by:   Almeta Monas CMA (AAMA) on 10/16/2010   Method used:   [facility] ...       [id number]       [address]       [place], [place]  [address]       Ph: [phone] or [phone]       Fax: [phone]   RxID:   [id number]

## 2011-01-30 NOTE — Progress Notes (Signed)
Summary: Discomfort at injection site  Phone Note Call from Patient   Caller: Patient Call For: Loreen Freud DO Details for Reason: Sore at Inj shot Summary of Call: Spoke with patient and she stated she was having some soreness at her flu injection site, Adv that was normal, advised she may experience some discomfort for the next few days, try ibuprofen and if no relief by Monday call for an Eval.... Pt voiced understanding. Initial call taken by: Almeta Monas CMA Duncan Dull),  November 08, 2010 1:59 PM

## 2011-01-30 NOTE — Letter (Signed)
Summary: Results Follow up Letter  [facility]  [address] [place], [place] [address]   Phone: [phone]  Fax: [phone]    06/22/2010 MRN: [id number]  Paige Fleming [address] [place], [place]  [address]  Dear Paige Fleming,  The following are the results of your recent test(s):  Test         Result    Pap Smear:        Normal __X___  Not Normal _____ Comments: ______________________________________________________ Cholesterol: LDL(Bad cholesterol):         Your goal is less than:         HDL (Good cholesterol):       Your goal is more than: Comments:  ______________________________________________________ Mammogram:        Normal _____  Not Normal _____ Comments:  ___________________________________________________________________ Hemoccult:        Normal _____  Not normal _______ Comments:    _____________________________________________________________________ Other Tests:    We routinely do not discuss normal results over the telephone.  If you desire a copy of the results, or you have any questions about this information we can discuss them at your next office visit.   Sincerely,    Army Fossa CMA  June 22, 2010 8:28 AM
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
## 2011-02-01 NOTE — Assessment & Plan Note (Signed)
Summary: COUGH AND CONGESTION/KP   Vital Signs:  Patient profile:   45 year old female Weight:      171.38 pounds O2 Sat:      98 % on Room air Temp:     99.9 degrees F oral Pulse rate:   112 / minute Pulse rhythm:   regular BP sitting:   128 / 86  (left arm) Cuff size:   large  Vitals Entered By: Army Fossa CMA (December 27, 2010 3:59 PM)  O2 Flow:  Room air CC: Pt states when coughing she feels pain in back.  Comments x 1 day [facility] [address]    History of Present Illness: developed nose congestion, postnasal dripping and cough last week. The cough is mild Patient concerned today because she is experiencing chest pain associated with cough for the last 24 hours. The pain is mostly in the upper right chest anterior and posterior, some discomfort at the right side of the neck as well.  Review of systems Denies fevers Coughing up clear sputum with no blood No difficulty breathing No wheezing No lower extremity edema  Current Medications (verified): 1)  Zyrtec Allergy 10 Mg Tabs (Cetirizine Hcl) .... As Needed. 2)  Optivar 0.05 % Soln (Azelastine Hcl) .Marland Kitchen.. 1 Gtt Two Times A Day As Needed 3)  Astepro 0.15 % Soln (Azelastine Hcl) 4)  Onetouch Delica Lancets  Misc (Lancets) .... Accu Check Once Daily 5)  One Touch Ultra Mini Strips .... Accu Check Once Daily 6)  Singulair 10 Mg Tabs (Montelukast Sodium) .... Take One Tablet Daily As Needed. 7)  Lisinopril 20 Mg Tabs (Lisinopril) .Marland Kitchen.. 1 By Mouth Once Daily 8)  Glucophage Xr 500 Mg Xr24h-Tab (Metformin Hcl) .Marland Kitchen.. 1 By Mouth At Bedtime 9)  Crestor 10 Mg Tabs (Rosuvastatin Calcium) .Marland Kitchen.. 1 By Mouth Daily At Bedtime 10)  Flonase 50 Mcg/act Susp (Fluticasone Propionate) .... 2 Sprays in Each Nostril Once Daily 11)  Flonase 50 Mcg/act Susp (Fluticasone Propionate) .... 2 Sprays Each Nostril Once Daily  Allergies (verified): No Known Drug Allergies  Past History:  Past Medical History: Reviewed history from 12/19/2010 and  no changes required. 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 Tubal ligation 2001 cervical biopsy 2000  Physical Exam  General:  alert and well-developed.  no apparent distress Ears:  R ear normal and L ear normal.   Nose:  slightly congested Neck:  full range of motion, no crepitus Chest Wall:  chest wall is symmetric and nontender to palpation anywhere in the right side Lungs:  normal respiratory effort, no intercostal retractions, no accessory muscle use, and normal breath sounds.  good breath sounds in both fields Heart:  normal rate and no murmur.   initially, heart rate was around  112, pulse rechecked by me and is around 90 Extremities:  no edema   Impression & Recommendations:  Problem # 1:  COUGH (ICD-786.2) Assessment New  cough associated with URI symptoms, now with pain in the chest. physical exam without evidence of pneumonia or pneumothorax. to be sure will do a chest x-ray see instructions     Orders: T-2 View CXR (71020TC)  Complete Medication List: 1)  Zyrtec Allergy 10 Mg Tabs (Cetirizine hcl) .Marland KitchenMarland KitchenMarland Kitchen  As needed. 2)  Optivar 0.05 % Soln (Azelastine hcl) .Marland Kitchen.. 1 gtt two times a day as needed 3)  Astepro 0.15 % Soln (Azelastine hcl) 4)  Onetouch Delica Lancets Misc (Lancets) .... Accu check once daily 5)  One Touch Ultra Mini Strips  .... Accu check once daily 6)  Singulair 10 Mg Tabs (Montelukast sodium) .... Take one tablet daily as needed. 7)  Lisinopril 20 Mg Tabs (Lisinopril) .Marland Kitchen.. 1 by mouth once daily 8)  Glucophage Xr 500 Mg Xr24h-tab (Metformin hcl) .... 2 by  mouth at bedtime 9)  Crestor 20 Mg Tabs (Rosuvastatin calcium) .Marland Kitchen.. 1 tablet by mouth at bedtime 10)  Flonase 50 Mcg/act Susp (Fluticasone propionate) .... 2 sprays in each nostril once daily 11)  Flonase 50 Mcg/act Susp (Fluticasone propionate) .... 2 sprays each nostril once daily 12)  Zithromax Z-pak 250 Mg Tabs (Azithromycin) .... As directed  Patient Instructions: 1)  rest, fluids, Tylenol 2)  Mucinex DM twice a day as needed for cough 3)  chest x-ray 4)  If you're not better in a few days, start a zpack 5)  call any time if the symptoms get worse or severe Prescriptions: ZITHROMAX Z-PAK 250 MG TABS (AZITHROMYCIN) as directed  #1 x 0   Entered and Authorized by:   Nolon Rod. Paz MD   Signed by:   Nolon Rod. Paz MD on 12/27/2010   Method used:   Print then Give to Patient   RxID:   [id number]    Orders Added: 1)  T-2 View CXR [71020TC] 2)  Est. Patient Level III [14782]

## 2011-02-01 NOTE — Assessment & Plan Note (Signed)
Summary: swollen index finger//kp   Vital Signs:  Patient profile:   45 year old female Weight:      173.2 pounds Pulse rate:   96 / minute Pulse rhythm:   regular BP sitting:   144 / 76  (right arm) Cuff size:   large  Vitals Entered By: Almeta Monas CMA Duncan Dull) (January 09, 2011 11:39 AM) CC: c/o swelling and pain to the left index finger x2days   History of Present Illness: Pt here c/o swelling L index finger since Sunday am.  No injury.  No insect bite, scratch etc.      Current Medications (verified): 1)  Zyrtec Allergy 10 Mg Tabs (Cetirizine Hcl) .... As Needed. 2)  Optivar 0.05 % Soln (Azelastine Hcl) .Marland Kitchen.. 1 Gtt Two Times A Day As Needed 3)  Astepro 0.15 % Soln (Azelastine Hcl) 4)  Onetouch Delica Lancets  Misc (Lancets) .... Accu Check Once Daily 5)  One Touch Ultra Mini Strips .... Accu Check Once Daily 6)  Singulair 10 Mg Tabs (Montelukast Sodium) .... Take One Tablet Daily As Needed. 7)  Lisinopril 20 Mg Tabs (Lisinopril) .Marland Kitchen.. 1 By Mouth Once Daily 8)  Glucophage Xr 500 Mg Xr24h-Tab (Metformin Hcl) .... 2 By Mouth At Bedtime 9)  Crestor 20 Mg Tabs (Rosuvastatin Calcium) .Marland Kitchen.. 1 Tablet By Mouth At Bedtime 10)  Flonase 50 Mcg/act Susp (Fluticasone Propionate) .... 2 Sprays in Each Nostril Once Daily 11)  Flonase 50 Mcg/act Susp (Fluticasone Propionate) .... 2 Sprays Each Nostril Once Daily 12)  Prednisone 10 Mg Tabs (Prednisone) .... 3 By Mouth Once Daily For 3 Days Then 2 By Mouth Once Daily For 3 Days Then 1 By Mouth Once Daily For 3 Days 13)  Keflex 500 Mg Caps (Cephalexin) .Marland Kitchen.. 1 By Mouth Two Times A Day  Allergies (verified): No Known Drug Allergies  Past History:  Past medical, surgical, family and social histories (including risk factors) reviewed for relevance to current acute and chronic problems.  Past Medical History: Reviewed history from 12/19/2010 and no changes required. 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 Tubal ligation 2001 cervical biopsy 2000  Family History: Reviewed history from 06/01/2010 and no changes required. Family History Hypertension - mother, maternal grandmother, maternal aunts Crohns diseas - twin sister Arthritis - mother, maternal grandmother Family History Breast cancer - maternal cousin Diabetes - Father, paternal aunts allergies: children asthma: children Family History Breast cancer 1st degree relative 66yo--mom  Social History: Reviewed history from 03/16/2009 and no changes required. Occupation: Optician, dispensing- for [employer] Married two children Never Smoked Alcohol use-no Drug use-no Regular exercise-yes  Review of Systems      See HPI  Physical Exam  General:  Well-developed,well-nourished,in no acute distress; alert,appropriate and cooperative throughout examination Skin:  L index finger swollen, hot to touch  tender to touch over PIP and DIP  Psych:  Oriented X3 and normally interactive.     Impression & Recommendations:  Problem # 1:  CELLULITIS, FINGER (ICD-681.00)  The following medications were removed from the medication list:    Zithromax Z-pak 250 Mg Tabs (Azithromycin) .Marland Kitchen... As directed Her updated medication list for this problem includes:    Keflex 500 Mg Caps (Cephalexin) .Marland KitchenMarland KitchenMarland KitchenMarland Kitchen  1 by mouth two times a day  Elevate affected area. Warm moist compresses for 20 minutes every 2 hours while awake. Take antibiotics as directed and take acetaminophen as needed. To be seen in 48-72 hours if no improvement, sooner if worse.  Complete Medication List: 1)  Zyrtec Allergy  10 Mg Tabs (Cetirizine hcl) .... As needed. 2)  Optivar 0.05 % Soln (Azelastine hcl) .Marland Kitchen.. 1 gtt two times a day as needed 3)  Astepro 0.15 % Soln (Azelastine hcl) 4)  Onetouch Delica Lancets Misc (Lancets) .... Accu check once daily 5)  One Touch Ultra Mini Strips  .... Accu check once daily 6)  Singulair 10 Mg Tabs (Montelukast sodium) .... Take one tablet daily as needed. 7)  Lisinopril 20 Mg Tabs (Lisinopril) .Marland Kitchen.. 1 by mouth once daily 8)  Glucophage Xr 500 Mg Xr24h-tab (Metformin hcl) .... 2 by mouth at bedtime 9)  Crestor 20 Mg Tabs (Rosuvastatin calcium) .Marland Kitchen.. 1 tablet by mouth at bedtime 10)  Flonase 50 Mcg/act Susp (Fluticasone propionate) .... 2 sprays in each nostril once daily 11)  Flonase 50 Mcg/act Susp (Fluticasone propionate) .... 2 sprays each nostril once daily 12)  Prednisone 10 Mg Tabs (Prednisone) .... 3 by mouth once daily for 3 days then 2 by mouth once daily for 3 days then 1 by mouth once daily for 3 days 13)  Keflex 500 Mg Caps (Cephalexin) .Marland Kitchen.. 1 by mouth two times a day  Other Orders: Tdap => 37yrs IM (57846) Admin 1st Vaccine (96295)  Patient Instructions: 1)  f/u 3-4 days if no better Prescriptions: KEFLEX 500 MG CAPS (CEPHALEXIN) 1 by mouth two times a day  #20 x 0   Entered and Authorized by:   Loreen Freud DO   Signed by:   Loreen Freud DO on 01/09/2011   Method used:   [facility] to        [facility] [phone][facility] (retail)       [address]       [place], [place]  [address]       Ph: [phone]       Fax: [phone]   RxID:   [id number] PREDNISONE 10 MG TABS (PREDNISONE) 3 by mouth once daily for 3 days then 2 by mouth once daily for 3 days then 1 by mouth once daily for 3 days  #18 x 0   Entered and Authorized by:   Loreen Freud DO   Signed by:   Loreen Freud DO on 01/09/2011   Method used:   [facility] to        [facility] [phone]* (retail)       [address]       [place], [place]  [address]       Ph: [phone]       Fax: [phone]   RxID:   [id number]    Orders Added: 1)  Est. Patient Level III [73710] 2)  Tdap => 62yrs IM [90715] 3)  Admin 1st Vaccine [62694]   Immunizations Administered:  Tetanus Vaccine:    Vaccine Type: Tdap    Site: right deltoid    Mfr: Merck    Dose: 0.5 ml    Route: IM    Given by: Almeta Monas CMA (AAMA)    Exp. Date: 10/19/2012    Lot #: [id number]    VIS given: 11/17/08 version given January 09, 2011.  Immunizations Administered:  Tetanus Vaccine:    Vaccine Type: Tdap    Site: right deltoid    Mfr: Merck    Dose: 0.5 ml    Route: IM    Given by: Almeta Monas CMA (AAMA)    Exp. Date: 10/19/2012    Lot #: [id number]    VIS given: 11/17/08 version given January 09, 2011.

## 2011-02-01 NOTE — Progress Notes (Signed)
Summary: Results 12/27,12/28  Phone Note Outgoing Call   Call placed by: Almeta Monas CMA Duncan Dull),  December 26, 2010 1:39 PM Call placed to: Patient Details for Reason: DM not controlled----increase Glucophage xr 500 mg  2 by mouth qpm  #60 2 refills Ideally your LDL (bad cholesterol) should be <____, your HDL (good cholesterol) should be >___ and your triglycerides should be< 150.  Diet and exercise will increase HDL and decrease the LDL and triglycerides. Read Dr. Danice Goltz book--Eat Drink and Be Healthy. Increase Crestor 20 mg #30 1 by mouth  at bedtime,  2 refills.   We will recheck labs in _3__ months.    lipid, hep, bmp, hgba1c, microalbumin  250.00,  272.4    Summary of Call: Left message to call back.... Chontel Warning CMA Duncan Dull)  December 26, 2010 1:39 PM Left message to call back... Andreyah Natividad CMA Duncan Dull)  December 27, 2010 12:53 PM   spoke with patient, advised of the above, [facility] [address] [facility] pt request.... Gianella Chismar CMA Duncan Dull)  December 27, 2010 4:01 PM     New/Updated Medications: GLUCOPHAGE XR 500 MG XR24H-TAB (METFORMIN HCL) 2 by mouth at bedtime CRESTOR 20 MG TABS (ROSUVASTATIN CALCIUM) 1 tablet by mouth at bedtime Prescriptions: GLUCOPHAGE XR 500 MG XR24H-TAB (METFORMIN HCL) 2 by mouth at bedtime  #60 x 2   Entered by:   Almeta Monas CMA (AAMA)   Authorized by:   Loreen Freud DO   Signed by:   Almeta Monas CMA (AAMA) on 12/27/2010   Method used:   [facility]ed to ...       [facility]  [address] [id number] ([facility])       [address]       [place], [place]  [address]       Ph: [phone] or [phone]       Fax: [phone]   RxID:   [id number] CRESTOR 20 MG TABS (ROSUVASTATIN CALCIUM) 1 tablet by mouth at bedtime  #30 x 2   Entered by:   Almeta Monas CMA (AAMA)   Authorized by:   Loreen Freud DO   Signed by:   Almeta Monas CMA (AAMA) on 12/27/2010   Method used:   [facility]        [facility]  [address] [id number] ([facility]ail[facility]       [address]       [place], [place]  [address]       Ph: [phone] or [phone]       Fax: [phone]   RxID:   [id number]

## 2011-02-01 NOTE — Assessment & Plan Note (Signed)
Summary: rto 3 months/cbs   Vital Signs:  Patient profile:   45 year old female Weight:      172.4 pounds Pulse rate:   76 / minute Pulse rhythm:   regular BP sitting:   140 / 90  (right arm) Cuff size:   large  Vitals Entered By: Almeta Monas CMA Duncan Dull) (December 19, 2010 8:37 AM)  Serial Vital Signs/Assessments:  Time      Position  BP       Pulse  Resp  Temp     By 8:53 AM             130/88                         Loreen Freud DO  CC: 3 mo f/u with fasting labs.Marland Kitchen..250.00, 272.4  hgba1c, bmp, hep, lipid, microalbumin , URI symptoms   History of Present Illness:  Type 1 diabetes mellitus follow-up      This is a 45 year old woman who presents with Type 2 diabetes mellitus follow-up.  The patient denies polyuria, polydipsia, blurred vision, self managed hypoglycemia, hypoglycemia requiring help, weight loss, weight gain, and numbness of extremities.  The patient denies the following symptoms: neuropathic pain, chest pain, vomiting, orthostatic symptoms, poor wound healing, intermittent claudication, vision loss, and foot ulcer.  Since the last visit the patient reports good dietary compliance, compliance with medications, not exercising regularly, and not monitoring blood glucose.  Since the last visit, the patient reports having had eye care by an ophthalmologist and no foot care.    Hyperlipidemia follow-up      The patient also presents for Hyperlipidemia follow-up.  The patient denies muscle aches, GI upset, abdominal pain, flushing, itching, constipation, diarrhea, and fatigue.  The patient denies the following symptoms: chest pain/pressure, exercise intolerance, dypsnea, palpitations, syncope, and pedal edema.  Compliance with medications (by patient report) has been near 100%.  Dietary compliance has been good.  The patient reports exercising occasionally.    Hypertension follow-up      The patient also presents for Hypertension follow-up.  The patient denies lightheadedness,  urinary frequency, headaches, edema, impotence, rash, and fatigue.  The patient denies the following associated symptoms: chest pain, chest pressure, exercise intolerance, dyspnea, palpitations, syncope, leg edema, and pedal edema.  Compliance with medications (by patient report) has been near 100%.  The patient reports that dietary compliance has been good.  The patient reports exercising occasionally.  Adjunctive measures currently used by the patient include salt restriction.         The patient also presents with URI symptoms.  The symptoms began 3 days ago.  The patient complains of nasal congestion and clear nasal discharge, but denies purulent nasal discharge, sore throat, dry cough, productive cough, earache, and sick contacts.  The patient denies fever, low-grade fever (<100.5 degrees), fever of 100.5-103 degrees, fever of 103.1-104 degrees, fever to >104 degrees, stiff neck, dyspnea, wheezing, rash, vomiting, diarrhea, use of an antipyretic, and response to antipyretic.  The patient denies itchy watery eyes, itchy throat, sneezing, seasonal symptoms, response to antihistamine, headache, muscle aches, and severe fatigue.  The patient denies the following risk factors for Strep sinusitis: unilateral facial pain, unilateral nasal discharge, poor response to decongestant, double sickening, tooth pain, Strep exposure, tender adenopathy, and absence of cough.    Current Medications (verified): 1)  Zyrtec Allergy 10 Mg Tabs (Cetirizine Hcl) .... As Needed. 2)  Optivar 0.05 % Soln (  Azelastine Hcl) .Marland Kitchen.. 1 Gtt Two Times A Day As Needed 3)  Astepro 0.15 % Soln (Azelastine Hcl) 4)  Onetouch Delica Lancets  Misc (Lancets) .... Accu Check Once Daily 5)  One Touch Ultra Mini Strips .... Accu Check Once Daily 6)  Singulair 10 Mg Tabs (Montelukast Sodium) .... Take One Tablet Daily As Needed. 7)  Lisinopril 10 Mg Tabs (Lisinopril) .Marland Kitchen.. 1 By Mouth Once Daily 8)  Glucophage Xr 500 Mg Xr24h-Tab (Metformin Hcl)  .Marland Kitchen.. 1 By Mouth At Bedtime 9)  Crestor 10 Mg Tabs (Rosuvastatin Calcium) .Marland Kitchen.. 1 By Mouth Daily At Bedtime 10)  Flonase 50 Mcg/act Susp (Fluticasone Propionate) .... 2 Sprays in Each Nostril Once Daily  Allergies (verified): No Known Drug Allergies  Past History:  Past Surgical History: Last updated: 07/20/2008 Tubal ligation 2001 cervical biopsy 2000  Family History: Last updated: 06/01/2010 Family History Hypertension - mother, maternal grandmother, maternal aunts Crohns diseas - twin sister Arthritis - mother, maternal grandmother Family History Breast cancer - maternal cousin Diabetes - Father, paternal aunts allergies: children asthma: children Family History Breast cancer 1st degree relative 66yo--mom  Social History: Last updated: 03/16/2009 Occupation: Optician, dispensing- for [employer] Married two children Never Smoked Alcohol use-no Drug use-no Regular exercise-yes  Risk Factors: Alcohol Use: <1 (06/01/2010) Caffeine Use: 0 (06/01/2010) Diet: healthy  (06/01/2010) Exercise: yes (06/01/2010)  Risk Factors: Smoking Status: never (06/01/2010) Passive Smoke Exposure: yes (06/01/2010)  Past Medical History: Gestational Diabetes mellitus - 2001 Allergic rhinitis Current Problems:  ALLERGIC RHINITIS (ICD-477.9) 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 Family History Hypertension - mother, maternal grandmother, maternal aunts Crohns diseas - twin sister Arthritis - mother, maternal grandmother Family History Breast cancer - maternal cousin Diabetes - Father, paternal aunts allergies: children asthma: children Family History Breast cancer 1st degree relative  66yo--mom  Social History: Reviewed history from 03/16/2009 and no changes required. 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   Mouth:  Oral mucosa and oropharynx without lesions or exudates.  Teeth in good repair. Lungs:  Normal respiratory effort, chest expands symmetrically. Lungs are clear to auscultation, no crackles or wheezes. Heart:  normal rate and no murmur.   Extremities:  No clubbing, cyanosis, edema, or deformity noted with normal full range of motion of all joints.   Cervical Nodes:  No lymphadenopathy noted Psych:  Oriented X3 and normally interactive.    Diabetes Management Exam:    Foot Exam (with socks and/or shoes not present):       Sensory-Pinprick/Light touch:          Left medial foot (L-4): normal          Left dorsal foot (L-5): normal          Left lateral foot (S-1): normal          Right medial foot (L-4): normal          Right dorsal foot (L-5): normal          Right lateral foot (S-1): normal       Sensory-Monofilament:  Left foot: normal          Right foot: normal       Inspection:          Left foot: normal          Right foot: normal       Nails:          Left foot: normal          Right foot: normal    Eye Exam:       Eye Exam done elsewhere          Date: 11/08/2010          Results: normal          Done by: [facility]   Impression & Recommendations:  Problem # 1:  HYPERTENSION (ICD-401.9)  Her updated medication list for this problem includes:    Lisinopril 20 Mg Tabs (Lisinopril) .Marland Kitchen... 1 by mouth once daily  BP today: 140/90 Prior BP: 130/88 (09/21/2010)  Labs Reviewed: K+: 4.4 (09/21/2010) Creat: :  1.0 (09/21/2010)   Chol: 262 (09/21/2010)   HDL: 51.90 (09/21/2010)   LDL: DEL (08/27/2008)   TG: 91.0 (09/21/2010)  Problem # 2:  HYPERLIPIDEMIA (ICD-272.4)  Her updated medication list for this problem includes:    Crestor 10 Mg Tabs (Rosuvastatin calcium) .Marland Kitchen... 1 by mouth daily at bedtime  Orders: Venipuncture (16109) TLB-BMP (Basic Metabolic Panel-BMET) (80048-METABOL) TLB-Hepatic/Liver Function Pnl (80076-HEPATIC) TLB-A1C / Hgb A1C (Glycohemoglobin) (83036-A1C) TLB-Microalbumin/Creat Ratio, Urine (82043-MALB) TLB-Lipid Panel (80061-LIPID)  Labs Reviewed: SGOT: 21 (09/21/2010)   SGPT: 16 (09/21/2010)   HDL:51.90 (09/21/2010), 55.80 (06/01/2010)  LDL:DEL (08/27/2008)  Chol:262 (09/21/2010), 235 (06/01/2010)  Trig:91.0 (09/21/2010), 110.0 (06/01/2010)  Problem # 3:  DIABETES MELLITUS, TYPE II (ICD-250.00)  Her updated medication list for this problem includes:    Lisinopril 20 Mg Tabs (Lisinopril) .Marland Kitchen... 1 by mouth once daily    Glucophage Xr 500 Mg Xr24h-tab (Metformin hcl) .Marland Kitchen... 1 by mouth at bedtime  Orders: Venipuncture (60454) TLB-BMP (Basic Metabolic Panel-BMET) (80048-METABOL) TLB-Hepatic/Liver Function Pnl (80076-HEPATIC) TLB-A1C / Hgb A1C (Glycohemoglobin) (83036-A1C) TLB-Microalbumin/Creat Ratio, Urine (82043-MALB) TLB-Lipid Panel (80061-LIPID)  Labs Reviewed: Creat: 1.0 (09/21/2010)     Last Eye Exam: normal (11/21/2010) Reviewed HgBA1c results: 7.5 (09/21/2010)  7.4 (06/19/2010)  Problem # 4:  URI (ICD-465.9)  Her updated medication list for this problem includes:    Zyrtec Allergy 10 Mg Tabs (Cetirizine hcl) .Marland Kitchen... As needed.     Flonase  Instructed on symptomatic treatment. Call if symptoms persist or worsen.   Complete Medication List: 1)  Zyrtec Allergy 10 Mg Tabs (Cetirizine hcl) .... As needed. 2)  Optivar 0.05 % Soln (Azelastine hcl) .Marland Kitchen.. 1 gtt two times a day as needed 3)  Astepro 0.15 % Soln (Azelastine hcl) 4)  Onetouch Delica Lancets Misc  (Lancets) .... Accu check once daily 5)  One Touch Ultra Mini Strips  .... Accu check once daily 6)  Singulair 10 Mg Tabs (Montelukast sodium) .... Take one tablet daily as needed. 7)  Lisinopril 20 Mg Tabs (Lisinopril) .Marland Kitchen.. 1 by mouth once daily 8)  Glucophage Xr 500 Mg Xr24h-tab (Metformin hcl) .Marland Kitchen.. 1 by mouth at bedtime 9)  Crestor 10 Mg Tabs (Rosuvastatin calcium) .Marland Kitchen.. 1 by mouth daily at bedtime 10)  Flonase 50 Mcg/act Susp (Fluticasone propionate) .... 2 sprays in each nostril once daily 11)  Flonase 50 Mcg/act Susp (Fluticasone propionate) .... 2 sprays each nostril once daily  Patient Instructions: 1)  Please schedule a follow-up appointment  in 3 months .  Prescriptions: LISINOPRIL 20 MG TABS (LISINOPRIL) 1 by mouth once daily  #30 x 2   Entered and Authorized by:   Loreen Freud DO   Signed by:   Loreen Freud DO on 12/19/2010   Method used:   Electronically to        [facility]  [address] [id number] ([facility])       [address]       [place], [place]  [address]       Ph: [phone] or [phone]       Fax: [phone]   RxID:   [id number]    Orders Added: 1)  Venipuncture [01601] 2)  TLB-BMP (Basic Metabolic Panel-BMET) [80048-METABOL] 3)  TLB-Hepatic/Liver Function Pnl [80076-HEPATIC] 4)  TLB-A1C / Hgb A1C (Glycohemoglobin) [83036-A1C] 5)  TLB-Microalbumin/Creat Ratio, Urine [82043-MALB] 6)  TLB-Lipid Panel [80061-LIPID] 7)  Est. Patient Level III [09323]

## 2011-03-08 ENCOUNTER — Encounter: Payer: Self-pay | Admitting: Family Medicine

## 2011-03-12 ENCOUNTER — Encounter: Payer: Self-pay | Admitting: Family Medicine

## 2011-03-20 ENCOUNTER — Ambulatory Visit (INDEPENDENT_AMBULATORY_CARE_PROVIDER_SITE_OTHER): Payer: Federal, State, Local not specified - PPO | Admitting: *Deleted

## 2011-03-20 ENCOUNTER — Encounter: Payer: Self-pay | Admitting: Family Medicine

## 2011-03-20 ENCOUNTER — Telehealth: Payer: Self-pay | Admitting: Family Medicine

## 2011-03-20 VITALS — BP 130/80 | HR 96 | Temp 98.7°F | Wt 170.0 lb

## 2011-03-20 DIAGNOSIS — E785 Hyperlipidemia, unspecified: Secondary | ICD-10-CM

## 2011-03-20 DIAGNOSIS — J019 Acute sinusitis, unspecified: Secondary | ICD-10-CM

## 2011-03-20 DIAGNOSIS — E119 Type 2 diabetes mellitus without complications: Secondary | ICD-10-CM

## 2011-03-20 DIAGNOSIS — I10 Essential (primary) hypertension: Secondary | ICD-10-CM

## 2011-03-20 MED ORDER — AMOXICILLIN-POT CLAVULANATE 875-125 MG PO TABS: 1.0000 | ORAL_TABLET | Freq: Two times a day (BID) | ORAL | Status: AC

## 2011-03-20 NOTE — Medication Information (Signed)
Summary: Medication Nonadherence-Lisinopril  Medication Nonadherence-Lisinopril   Imported By: Maryln Gottron 03/15/2011 09:24:58  _____________________________________________________________________  External Attachment:    Type:   Image     Comment:   External Document

## 2011-03-20 NOTE — Assessment & Plan Note (Signed)
Check labs Pt was only taking 1 metformin--she will take 2 as previously told after last lab Check blood sugar bid

## 2011-03-20 NOTE — Assessment & Plan Note (Signed)
Augmentin sent  Take nasal sprays and all other allergy meds  rto prn

## 2011-03-20 NOTE — Progress Notes (Signed)
Subjective:    Patient ID: Paige Fleming, female    DOB: March 19, 1966, 45 y.o.   MRN: [id number]  Sinusitis This is a new problem. The current episode started in the past 7 days. The problem has been gradually worsening since onset. There has been no fever. Her pain is at a severity of 0/10. She is experiencing no pain. Associated symptoms include congestion, coughing, sinus pressure, sneezing and a sore throat. Pertinent negatives include no chills, diaphoresis, ear pain, hoarse voice, neck pain, shortness of breath or swollen glands. Past treatments include spray decongestants. The treatment provided no relief.  Diabetes She presents for her follow-up diabetic visit. She has type 2 diabetes mellitus. No MedicAlert identification noted. The initial diagnosis of diabetes was made 8 months ago. There are no hypoglycemic associated symptoms. Pertinent negatives for hypoglycemia include no sweats. There are no diabetic associated symptoms. Pertinent negatives for diabetes include no blurred vision, no chest pain, no fatigue, no foot paresthesias, no foot ulcerations, no polydipsia, no polyphagia, no polyuria, no visual change, no weakness and no weight loss. There are no hypoglycemic complications. Symptoms are stable. There are no diabetic complications. Pertinent negatives for diabetic complications include no CVA, PVD or retinopathy. Risk factors for coronary artery disease include diabetes mellitus, dyslipidemia, family history and hypertension. Current diabetic treatment includes oral agent (monotherapy). She is compliant with treatment all of the time. She is following a diabetic diet. Meal planning includes avoidance of concentrated sweets and carbohydrate counting. She participates in exercise daily. She monitors blood glucose at home 1-2 x per week. Blood glucose monitoring compliance is inadequate. An ACE inhibitor/angiotensin II receptor blocker is being taken. She does not see a  podiatrist.Eye exam is current.  Hypertension This is a chronic problem. The current episode started more than 1 month ago. The problem has been gradually improving since onset. The problem is controlled. Pertinent negatives include no anxiety, blurred vision, chest pain, malaise/fatigue, neck pain, orthopnea, palpitations, peripheral edema, PND, shortness of breath or sweats. There are no associated agents to hypertension. Risk factors for coronary artery disease include diabetes mellitus and dyslipidemia. Past treatments include ACE inhibitors. The current treatment provides significant improvement. There are no compliance problems.  There is no history of angina, kidney disease, CAD/MI, CVA, heart failure, left ventricular hypertrophy, PVD or retinopathy.  Hyperlipidemia This is a chronic problem. The current episode started more than 1 month ago. The problem is controlled. Recent lipid tests were reviewed and are high. Factors aggravating her hyperlipidemia include no known factors. Pertinent negatives include no chest pain or shortness of breath. Current antihyperlipidemic treatment includes statins. The current treatment provides moderate improvement of lipids. There are no compliance problems.  Risk factors for coronary artery disease include diabetes mellitus, dyslipidemia, family history and hypertension.  Pt has tried Mucinex DM with no relief.    Review of Systems  Constitutional: Negative for chills, weight loss, malaise/fatigue, diaphoresis and fatigue.  HENT: Positive for congestion, sore throat, sneezing and sinus pressure. Negative for ear pain, hoarse voice and neck pain.   Eyes: Negative for blurred vision.  Respiratory: Positive for cough. Negative for shortness of breath.   Cardiovascular: Negative for chest pain, palpitations, orthopnea and PND.  Genitourinary: Negative for polyuria.  Neurological: Negative for weakness.  Hematological: Negative for polydipsia and polyphagia.        Objective:   Physical Exam  Constitutional: She is oriented to person, place, and time. She appears well-developed and well-nourished. No distress.  HENT:  Head:  Normocephalic and atraumatic.  Right Ear: Hearing, tympanic membrane, external ear and ear canal normal.  Left Ear: Hearing, tympanic membrane, external ear and ear canal normal.  Nose: Right sinus exhibits maxillary sinus tenderness and frontal sinus tenderness. Left sinus exhibits maxillary sinus tenderness.  Mouth/Throat: Oropharynx is clear and moist. No oropharyngeal exudate.  Eyes: Conjunctivae are normal. Right eye exhibits no discharge. Left eye exhibits no discharge.  Neck: Normal range of motion. Neck supple.  Cardiovascular: Normal rate, regular rhythm, S1 normal, S2 normal and normal heart sounds.   No murmur heard. Pulmonary/Chest: Breath sounds normal. No respiratory distress. She has no wheezes. She has no rales. She exhibits no tenderness.  Neurological: She is alert and oriented to person, place, and time.  Skin: Skin is warm and dry. No rash noted.  Psychiatric: She has a normal mood and affect.          Assessment & Plan:

## 2011-03-20 NOTE — Assessment & Plan Note (Signed)
Check labs con't meds 

## 2011-03-20 NOTE — Assessment & Plan Note (Signed)
Controlled con't meds  

## 2011-03-20 NOTE — Patient Instructions (Signed)
Please make a fasting lab appointment Finish your antibiotics  Call if you need anything else Make a follow up visit in 3 months

## 2011-03-27 ENCOUNTER — Other Ambulatory Visit (INDEPENDENT_AMBULATORY_CARE_PROVIDER_SITE_OTHER): Payer: Federal, State, Local not specified - PPO

## 2011-03-27 DIAGNOSIS — E119 Type 2 diabetes mellitus without complications: Secondary | ICD-10-CM

## 2011-03-27 DIAGNOSIS — E785 Hyperlipidemia, unspecified: Secondary | ICD-10-CM

## 2011-03-27 LAB — LIPID PANEL
Cholesterol: 165 mg/dL (ref 0–200)
LDL Cholesterol: 95 mg/dL (ref 0–99)
Total CHOL/HDL Ratio: 3
VLDL: 15.8 mg/dL (ref 0.0–40.0)

## 2011-03-27 LAB — BASIC METABOLIC PANEL
BUN: 11 mg/dL (ref 6–23)
CO2: 29 mEq/L (ref 19–32)
Chloride: 103 mEq/L (ref 96–112)
Creatinine, Ser: 0.9 mg/dL (ref 0.4–1.2)
Glucose, Bld: 133 mg/dL — ABNORMAL HIGH (ref 70–99)

## 2011-03-27 LAB — HEMOGLOBIN A1C: Hgb A1c MFr Bld: 8.3 % — ABNORMAL HIGH (ref 4.6–6.5)

## 2011-03-28 ENCOUNTER — Other Ambulatory Visit: Payer: Federal, State, Local not specified - PPO

## 2011-03-28 MED ORDER — METFORMIN HCL ER 500 MG PO TB24: 1000.0000 mg | ORAL_TABLET | Freq: Every evening | ORAL | Status: DC

## 2011-03-28 NOTE — Progress Notes (Signed)
Addended by: Almeta Monas on: 03/28/2011 03:08 PM   Modules accepted: Orders

## 2011-03-28 NOTE — Progress Notes (Signed)
DM not controlled----increase glucophage xr 500mg  2 po qpm #60 , 2 refills Cholesterol is good Recheck 3 months---hgba1c, bmp, hep, lipid, microalbumin 250.00 272.4     Reviewed the above results with the patient and faxed Rx to [facility] [address]      KP

## 2011-03-28 NOTE — Progress Notes (Signed)
patient aware of results    KP

## 2011-03-29 ENCOUNTER — Other Ambulatory Visit: Payer: Federal, State, Local not specified - PPO

## 2011-03-29 ENCOUNTER — Encounter: Payer: Self-pay | Admitting: Family Medicine

## 2011-03-29 DIAGNOSIS — E119 Type 2 diabetes mellitus without complications: Secondary | ICD-10-CM

## 2011-03-30 LAB — MICROALBUMIN / CREATININE URINE RATIO: Creatinine,U: 150.6 mg/dL

## 2011-04-02 ENCOUNTER — Encounter: Payer: Self-pay | Admitting: *Deleted

## 2011-04-02 NOTE — Progress Notes (Signed)
Letter mailed

## 2011-05-14 ENCOUNTER — Other Ambulatory Visit: Payer: Self-pay | Admitting: Family Medicine

## 2011-05-14 DIAGNOSIS — Z1231 Encounter for screening mammogram for malignant neoplasm of breast: Secondary | ICD-10-CM

## 2011-05-18 NOTE — Discharge Summary (Signed)
[facility]  Patient:    Paige Fleming, Paige Fleming           MRN: [id number] Adm. Date:  [id number] Disc. Date: [id number] Attending:  Shaune Spittle Dictator:   Mack Guise, C.N.M.                           Discharge Summary  ADMISSION DIAGNOSES:          1. Intrauterine pregnancy at term, delivered.                               2. Gestational diabetes with insulin                                  requirement.                               3. Desires sterilization.  PROCEDURES:                   1. Normal spontaneous vaginal delivery.                               2. Postpartum bilateral tubal sterilization.  DISCHARGE DIAGNOSES:          1. Intrauterine pregnancy at term, delivered.                               2. Normal spontaneous vaginal delivery.                               3. Postpartum tubal sterilization.  HOSPITAL COURSE:              The patient is a 45 year old African-American gravida 2, para 1 at term who presented in active labor, completely dilated. She went on to deliver a 6 pound 7 ounce female infant named Montel Clock with APGAR scores of 9 at one minute and 9 at five minutes for Dr. Dierdre Forth.  Her postpartum hemoglobin on the first day was 12.5.  On her first postpartum day, she underwent bilateral tubal sterilization and her postpartum and postoperative course has remained unremarkable.  She has been checking her blood sugars, which have all been within normal range, and she has discontinued her insulin.  On the second postpartum day, first postoperative day, she is judged to be in satisfactory condition for discharge, and is discharged to home.  DISCHARGE INSTRUCTIONS:       Per [facility] handout.  DISCHARGE MEDICATIONS:        Motrin 600 mg p.o. q.6h. p.r.n. pain.  Tylox, 1-2 p.o. q.3-4h. p.r.n. pain.  DISCHARGE FOLLOW UP:          At [facility] in six weeks.  The patient will continue to check  blood sugars and will call for any abnormals or for any other problems or concerns. DD:  06/17/00 TD:  06/18/00 Job: [id number]

## 2011-05-18 NOTE — Op Note (Signed)
[facility]  Patient:    Paige Fleming, Paige Fleming           MRN: [id number] Proc. Date: 06/15/00 Adm. Date:  [id number] Disc. Date: [id number] Attending:  Dierdre Forth [place]                           Operative Report  PREOPERATIVE DIAGNOSIS:       Postpartum, desires elective sterilization.  POSTOPERATIVE DIAGNOSIS:      Postpartum, desires elective sterilization.  OPERATION PERFORMED:          Postpartum tubal ligation, Pomeroy method.  SURGEON:                      Georgina Peer, M.D.  ANESTHESIA:                   General plus 8 cc of 0.25% Marcaine in the skin on the tubes.  ANESTHESIOLOGIST:             Raul Del, M.D.  ESTIMATED BLOOD LOSS:         Less than 25 cc.  COMPLICATIONS:                None.  FINDINGS:                     Normal tubes.  INDICATIONS:                  This is a 45 year old gravida 2, para 2 delivered on June 15 - normal delivery.  She desires no more children.  She was aware of the risks and complications of tubal ligation including bowel, bladder, vascular injury and the failure rate of 1-2% lifetime risk.  She was willing to proceed.  DESCRIPTION OF PROCEDURE:     The patient was taken to the operating room. After informed consent, she was placed in the dorsal supine position and given general anesthetic.  The lower abdomen was sterilely prepped and draped. Then, 5 cc of 0.25% Marcaine was injected subumbilically.  A 5 cm subumbilical incision was then made.  This was taken through the fascia and into the peritoneal cavity by sharp dissection, with bleeders being cauterized.  The right tube was identified and traced to its fimbriated end.  A mid portion knuckle was elevated.  Two plain ties were placed across the base of this knuckle and the mid portion excised.  Bleeders were cauterized.  Then, 1 cc of 0.25% Marcaine was drizzled on the end of the tubal segments.  A similar procedure was  done on the left, the tube being identified, traced to its fimbriated end, a mid portion knuckle elevated, doubly ligated and the mid portion excised.  Both lumens were identified bilaterally.  There was no bleeding.  Again ______ was placed on the tubal stubs.  The peritoneum was cauterized for any bleeding.  The fascia was closed with Vicryl and the skin closed with subcuticular Dexon.  Sponge, needle and instrument counts were correct.  The patient tolerated the procedure well, was awakened and transferred to the recovery area in good condition. DD:  06/15/00 TD:  06/18/00 Job: [id number]

## 2011-05-18 NOTE — H&P (Signed)
[facility]  Patient:    Paige Fleming, Paige Fleming              MRN: [id number] Adm. Date:  [id number] Attending:  Cleatrice Burke Dictator:   Mack Guise, C.N.M.                         History and Physical  HISTORY OF PRESENT ILLNESS:       Paige Fleming is a 45 year old African-American female, gravida 2, para 1-0-0-1, at term, EDD July 01, 2000. She has been contracting irregularly throughout the night and just called at approximately 5:45 a.m. when her contractions became regular and 5 minutes apart, and she was advised to present to [facility] at that time.  She has reported positive fetal movement, no bleeding, no rupture of membranes. Her pregnancy has been followed by the M.D. service at [facility] and is remarkable for a history of abnormal Pap smears, questionable LMP, and history of mitral valve prolapse with no prophylactic antibiotics.  The patient was also found to be gestational diabetic with an insulin requirement and group B strep positive.  This patient was initially evaluated at the office of [facility] on December 12, 1999, at approximately [redacted] weeks gestation.  EDD was established by pregnancy ultrasonography and confirmed with followup ultrasound.  Her pregnancy has been complicated by the fact that she was found to have gestational diabetes and did require insulin throughout the remainder of her pregnancy to maintain normal blood sugars.  PRENATAL LABORATORY DATA:         On December 12, 1999, hemoglobin and hematocrit 12.6 and 38.5.  Platelets 297,000.  Blood type and Rh O positive, antibody screen negative, sickle cell trait negative, VDRL nonreactive, rubella immune, hepatitis B surface antigen negative, HIV declined.  Urine culture negative.  Pap smear ASCUS on January 29, 2000.  AFP/free beta HCG within normal range.  At 28 weeks, one-hour glucose challenge 180.  Three-hour GTT found gestational diabetes.  At 36 weeks,  culture of vaginal tract positive for group B strep.  PAST MEDICAL HISTORY:             History of mitral valve prolapse with no functional difficulties and no prophylactic antibiotics.  ALLERGIES:                        No known drug allergies.  HABITS:                           The patient denies the use of tobacco, alcohol, or illicit drugs.  MEDICATIONS:                      Prenatal vitamins and NPH insulin 10 units q.h.s., last dose June 14 at 2200.  FAMILY HISTORY:                   Maternal grandfather, MI.  The patients mother and maternal grandmother have a history of chronic hypertension on medication, patients son with a history of asthma, paternal grandmother with diabetes, maternal aunt with lymphoma, maternal first cousin with pancreatic cancer, maternal first cousin with breast cancer.  PAST SURGICAL HISTORY:            D&C in 1995.  OBSTETRICAL HISTORY:              In 1990, normal spontaneous vaginal delivery at 37 weeks  with the birth of a 5 pound 12 ounce female infant.  The patient was induced related to bleeding.  In 1995, induced AB and present pregnancy.  GENETIC HISTORY:                  The patient is a twin, and paternal first cousins are twins.  Otherwise there is no family history of familial or genetic disorders, children who died in infancy or born with birth defects.  SOCIAL HISTORY:                   Paige Fleming is a 45 year old African-American, married female.  Her husband, Jari Favre, is involved and supportive.  They are of the [facility] faith.  REVIEW OF SYSTEMS:                No signs or symptoms suggestive of focal or systemic disease, and the patient is typical of one with uterine pregnancy at term in advanced active labor.  PHYSICAL EXAMINATION:  VITAL SIGNS:                      Stable, afebrile.  HEENT:                            Unremarkable.  NECK:                             Thyroid not enlarged.  HEART:                             Regular rate and rhythm.  LUNGS:                            Clear.  ABDOMEN:                          Gravid in its contour.  Uterine fundus is noted to extend 38 cm above the level of the pubic symphysis.  Leopold maneuver finds the infant to be in longitudinal lie, cephalic presentation, and the estimated fetal weight is 7 pounds.  PELVIC:                           Digital exam of the cervix finds it to be completely dilated with vertex at 0 station and a bulging bag of water.  The baseline of the fetal heart rate monitor is 140s with average long-term variability.  Reactivity is present with no periodic changes.   ASSESSMENT:                      1. Intrauterine pregnancy at term.                                   2. Advanced active labor.  PLAN:                             Admit to birthing suite per Dr. Edison Simon.  Dr. Dierdre Forth notified of patients admission.  Routine M.D. orders.  Start IV and begin ampicillin prophylaxis for positive group B strep. DD:  06/16/00 TD:  06/16/00 Job: [id number]

## 2011-05-18 NOTE — H&P (Signed)
[facility]  Patient:    Paige Fleming, Paige Fleming              MRN: [id number] Adm. Date:  [id number] Disc. Date: [id number] Attending:  Cleatrice Fleming Dictator:   Paige Fleming, C.N.M.                         History and Physical  HISTORY OF PRESENT ILLNESS:  Ms. Paige Fleming is a 45 year old married black female, Gravida 3, Para 1-0-1-1, at approximately [redacted] weeks gestation who presents to the hospital for a CST following a nonreactive NST in the office. Her pregnancy has been complicated by insulin-dependent gestational diabetes. She denies any regular uterine contractions.  She denies any leaking or vaginal bleeding.  She reports positive fetal movement.  Her pregnancy has been followed at [facility] by the MD service and has been complicated by a history of insulin-dependent gestational diabetes, history of abnormal Paps in the past, questionable LMP, and history of mitral valve prolapse not requiring antibiotics.  OB/GYN HISTORY:  She is a Gravida 3, Para 1-0-1-1 who delivered a viable female infant in 90 who weighed five pounds, 12 ounces at [redacted] weeks gestation following a five-hour labor.  She had a miscarriage in 1995 and has recurrent pregnancy.  Her other GYN history:  She reports a history of abnormal Pap in October 2000 and occasional yeast infections and trychomonas back in 1987.  GENERAL MEDICAL HISTORY:  She has no known drug allergies.  She reports having had the usual childhood diseases.  She reports a history of mitral valve prolapse that does not require prophylaxis, and she reports no other medical problems.  The only surgery she had was her D&C.  Her only hospitalization was for childbirth.  FAMILY HISTORY:  Significant for paternal grandfather with MI.  Patients mother and maternal grandmother with hypertension, on medications.  Patients son with chronic asthma.  Patients grandmother with diabetes.   Maternal aunt with lymphoma.  Maternal cousin with pancreatic cancer.  Maternal first cousin with breast cancer.  Genetic history is negative.  SOCIAL HISTORY:  She is married to Paige Fleming who is involved and supportive.  They are both employed fulltime.  They are of the [facility] faith. They deny any elicit drug use, alcohol, or smoking with this pregnancy.  PRENATAL LABORATORY:  Not available.  She had an elevated one-hour GTT and failed her three-hour GTT and has subsequently been on a gestational diabetic diet and has required insulin.  PHYSICAL EXAMINATION:  VITAL SIGNS:  Stable.  She is afebrile.  HEENT:  Grossly within normal limits.  HEART:  Regular rhythm and rate.  CHEST:  Clear.  BREASTS:  Soft, nontender.  ABDOMEN:  Gravid with a fundal height of approximately 33 cm.  Her fetal heart rate has revealed a late deceleration on two contractions within 30 minutes on a CST in maternity admissions, and a nonreactive nonstress test in the office. Other than this, she had a reactive tracing.  PELVIC:  Her cervical exam was deferred.  EXTREMITIES:  Within normal limits.  ASSESSMENT: 1. Intrauterine pregnancy at approximately 33 weeks. 2. Insulin-dependent gestational diabetes. 3. Equivocal CST.  PLAN:  Admit to [facility] for long-term monitoring, to repeat her CST on the subsequent day, and to get a complete OB ultrasound.  Dr. Kathryne Fleming is aware and has admitted patient and will follow patient. DD:  05/11/00 TD:  05/13/00 Job: [id number]

## 2011-05-18 NOTE — Consult Note (Signed)
[facility]  Patient:    Paige Fleming, Paige Fleming              MRN: [id number] Adm. Date:  [id number] Attending:  Cleatrice Burke                          Consultation Report  HISTORY OF PRESENT ILLNESS:   The patient is a 45 year old who has diabetes of pregnancy and is followed at [facility] and Gynecology.  The patient has had nonstress testing performed on a regular basis.  Her nonstress tests in the past have been reactive but she has also had decelerations associated with contractions.  She presents to maternity admissions for stress testing and evaluation.  OBJECTIVE:  VITAL SIGNS:                  The patient is afebrile and her vital signs are stable.  ABDOMEN:                      Nontender.  LABORATORIES                  Nonstress test shows accelerations of 15 beats per minute for greater than 15 seconds.  She has had two in 20 minutes. However, the patient does have variable decelerations and what appears to be an occasional late deceleration associated with a contraction.  An oxytocin challenge test was performed and again the patient was noted to have accelerations that is greater than 15 beats per minutes for 15 seconds.  She did have three contractions in 10 minutes and even though she had a variable deceleration with a slow return to baseline, the decelerations did not occur with 50% or more of the contractions.  For the most part, the tracing appears reassuring.  ASSESSMENT:                   1. Diabetes of pregnancy at 34 weeks.                               2. Decelerations noted on contraction stress                                  test and therefore I would classify this as                                  suspicious.  PLAN:                         The patient will be contacted and she will return on May 28, 2000 for repeat nonstress testing.  She will continue to monitor her blood sugars and her  fetal movements closely. DD:  05/29/00 TD:  05/29/00 Job: [id number]

## 2011-05-18 NOTE — Assessment & Plan Note (Signed)
[facility]                        [facility] [facility] OFFICE NOTE   Paige Fleming, Paige Fleming              MRN:          [id number]  DATE:04/24/2007                            DOB:          [birth year]    COMPLETE PHYSICAL EXAM.   The patient is a 45 year old African-American female here for complete  physical exam with Pap smear.  However, the patient is not fasting today  so she is unable to get labs and she has no complaints today.   She denies any past medical history.   PAST SURGICAL HISTORY:  Significant for tubal ligation in 2001.   She is a G3, P2-0-1-2, who had an abortion in 1995.   FAMILY HISTORY:  Maternal grandmother and mother with hypertension.  Her  mother has heart disease unknown type.  She denies any family history of  diabetes.  She has an aunt with lymphoma, two cousins with breast cancer  and on mother's side of the family somebody had pancreatic cancer.  She  has a sister with Crohn's disease.   A tetanus shot was done in 2004.   SOCIAL HISTORY:  1. She denies any smoking.  2. She occasionally drinks alcohol.  3. She is married with 2 children.  4. She works at the [employer].   She denies any current medications and she has no known drug allergies.   PHYSICAL EXAM:  Height 5 feet 3-1/2, weight 164.2, temperature 99, pulse  76, respirations 28, blood pressure 112/78.  She is awake, alert and  oriented, in no acute distress.  HEENT:  Head is normocephalic, atraumatic.  Eyes - Pupils are equal and  react to light.  Tympanic membranes are intact bilaterally.  Oropharynx  was clear.  NECK:  Supple, no JVD, no bruits.  HEART:  Positive S1, S2, no murmurs are appreciated.  LUNGS:  Clear bilaterally.  No rales, rhonchi or wheezing.  ABDOMEN:  Soft and nontender, no masses palpated.  BREAST EXAM:  No nipple discharge, no dimpling, no masses palpated, no  axillary nodes.  GU EXAM:  No external lesions, no discharge.  BIMANUAL EXAM:  No cervical motion tenderness, no adnexal tenderness, no  masses palpated.  RECTAL EXAM:  Heme negative, brown stool.  EXTREMITIES:  Good strength in all 4 extremities.  No sensory motor  deficit.  SKIN:  She does have a mole on her left arm which is unchanged.   EKG:  Sinus rhythm about 79 b.p.m.   ASSESSMENT AND PLAN:  1. Complete Physical with Pap smear.  Patient denies any problems.      Will check fasting laboratories with an NMR because of a history of      high cholesterol.  I did discuss self breast exam and calcium      supplementation.  Patient will schedule her mammogram and general      health maintenance is up to date.  2. High cholesterol.  Will check an NMR and hepatitis panel.     Lelon Perla, DO  Electronically Signed    Paige Fleming  DD: 04/24/2007  DT: 04/24/2007  Job #: [id number]

## 2011-05-18 NOTE — Discharge Summary (Signed)
[facility]  Patient:    Paige Fleming, Paige Fleming              MRN: [id number] Adm. Date:  [id number] Disc. Date: [id number] Attending:  Cleatrice Burke Dictator:   Miguel Dibble, C.N.M.                           Discharge Summary  ADMISSION DIAGNOSES:          1. Intrauterine pregnancy at [redacted] weeks gestation.                               2. Insulin-dependent diabetes mellitus.                               3. Nonreassuring fetal heart tones during an office                                  examination.                               4. Equivocal contraction stress test and unreactive                                  nonstress test.  DISCHARGE DIAGNOSES:          1. Intrauterine pregnancy at [redacted] weeks gestation.                               2. Insulin-dependent diabetes mellitus.                               3. Nonreassuring fetal heart tones during an office                                  examination.                               4. Equivocal contraction stress test and unreactive                                  nonstress test.                               5. Negative Oxytocin Challenge Test and reassuring                                  fetal heart tones at 33-1/7 weeks.  PROCEDURE:                    Nipple stimulation stress test.  Continuous electronic fetal monitoring.  Capillary blood glucose levels.  HOSPITAL COURSE:              On May 13, 2000, 45 year old Paige Fleming, gravida  3, para 1-0-1-1, presented for a routine nonstress test for fetal surveillance while being treated for insulin-dependent diabetes mellitus. During her nonstress test, spontaneous decellerations occurred.  It was followed by a ST in maternity admissions unit where three contractions in 10 minutes were followed by one late decelleration 15 minutes after another late decelleration occurred.  Otherwise, the rest of the tracing was reactive.   She was admitted for a 23-hour observation for an equivocal CST during which time her tracing was overall reactive and reassuring from 1530 to 0130 hours.  She had less than two variable decellerations that were mild per hour, occasional contractions in the night, less than four per hour.  Generally, the fetal heart rate was reactive.  She had an ultrasound indicating that for 33 weeks growth was between the 46th and 48th percentile with normal amniotic fluid volume and a normal grade II placenta. She had a nipple stimulation stress test which proved to be a negative CST.  There appeared to be no decellerations during her nipple stimulation stress test and accellerations were noted.  However, prior to her contraction stress test, she ad one late appearing decelleration and one brief variable.  She was discharged home in stable condition. Her fasting blood sugar on May 15, was 61.  DISCHARGE INSTRUCTIONS:       Fetal movement counts, antepartum instruction sheet. Follow up in the office.  Continue regular NSTs every three days.  DISCHARGE MEDICATIONS:        1. Prenatal vitamins. DD:  05/15/00 TD:  05/15/00 Job: [id number]

## 2011-06-15 ENCOUNTER — Encounter: Payer: Self-pay | Admitting: Family Medicine

## 2011-06-20 ENCOUNTER — Ambulatory Visit (INDEPENDENT_AMBULATORY_CARE_PROVIDER_SITE_OTHER): Payer: Federal, State, Local not specified - PPO | Admitting: Family Medicine

## 2011-06-20 ENCOUNTER — Encounter: Payer: Self-pay | Admitting: Family Medicine

## 2011-06-20 DIAGNOSIS — E119 Type 2 diabetes mellitus without complications: Secondary | ICD-10-CM

## 2011-06-20 DIAGNOSIS — E785 Hyperlipidemia, unspecified: Secondary | ICD-10-CM

## 2011-06-20 LAB — BASIC METABOLIC PANEL
BUN: 12 mg/dL (ref 6–23)
CO2: 28 mEq/L (ref 19–32)
Calcium: 9.6 mg/dL (ref 8.4–10.5)
Chloride: 103 mEq/L (ref 96–112)
Creatinine, Ser: 0.8 mg/dL (ref 0.4–1.2)
GFR: 95.46 mL/min (ref >60.00)
Glucose, Bld: 116 mg/dL — ABNORMAL HIGH (ref 70–99)
Potassium: 4.4 mEq/L (ref 3.5–5.1)
Sodium: 138 mEq/L (ref 135–145)

## 2011-06-20 LAB — CBC WITH DIFFERENTIAL/PLATELET
Basophils Absolute: 0 10*3/uL (ref 0.0–0.1)
Basophils Relative: 0.4 % (ref 0.0–3.0)
Eosinophils Absolute: 0.1 10*3/uL (ref 0.0–0.7)
Eosinophils Relative: 1.9 % (ref 0.0–5.0)
HCT: 38.8 % (ref 36.0–46.0)
Hemoglobin: 12.6 g/dL (ref 12.0–15.0)
Lymphocytes Relative: 30.9 % (ref 12.0–46.0)
Lymphs Abs: 1.9 10*3/uL (ref 0.7–4.0)
MCHC: 32.6 g/dL (ref 30.0–36.0)
MCV: 76.2 fl — ABNORMAL LOW (ref 78.0–100.0)
Monocytes Absolute: 0.5 10*3/uL (ref 0.1–1.0)
Monocytes Relative: 8.4 % (ref 3.0–12.0)
Neutro Abs: 3.6 10*3/uL (ref 1.4–7.7)
Neutrophils Relative %: 58.4 % (ref 43.0–77.0)
Platelets: 256 10*3/uL (ref 150.0–400.0)
RBC: 5.1 Mil/uL (ref 3.87–5.11)
RDW: 15.6 % — ABNORMAL HIGH (ref 11.5–14.6)
WBC: 6.2 10*3/uL (ref 4.5–10.5)

## 2011-06-20 LAB — HEPATIC FUNCTION PANEL
ALT: 18 U/L (ref 0–35)
AST: 20 U/L (ref 0–37)
Albumin: 4.5 g/dL (ref 3.5–5.2)
Alkaline Phosphatase: 84 U/L (ref 39–117)
Bilirubin, Direct: 0.1 mg/dL (ref 0.0–0.3)
Total Bilirubin: 0.4 mg/dL (ref 0.3–1.2)
Total Protein: 7.4 g/dL (ref 6.0–8.3)

## 2011-06-20 LAB — LIPID PANEL
Cholesterol: 262 mg/dL — ABNORMAL HIGH (ref 0–200)
Total CHOL/HDL Ratio: 4

## 2011-06-20 LAB — POCT URINALYSIS DIPSTICK
Bilirubin, UA: NEGATIVE
Blood, UA: NEGATIVE
Leukocytes, UA: NEGATIVE
Nitrite, UA: NEGATIVE
Urobilinogen, UA: 0.2
pH, UA: 6.5

## 2011-06-20 LAB — MICROALBUMIN / CREATININE URINE RATIO
Creatinine,U: 46.4 mg/dL
Microalb Creat Ratio: 0.2 mg/g (ref 0.0–30.0)
Microalb, Ur: 0.1 mg/dL (ref 0.0–1.9)

## 2011-06-20 LAB — LDL CHOLESTEROL, DIRECT: Direct LDL: 185.3 mg/dL

## 2011-06-20 LAB — HEMOGLOBIN A1C: Hgb A1c MFr Bld: 7.9 % — ABNORMAL HIGH (ref 4.6–6.5)

## 2011-06-20 MED ORDER — MONTELUKAST SODIUM 10 MG PO TABS: 10.0000 mg | ORAL_TABLET | Freq: Every day | ORAL | Status: DC | PRN

## 2011-06-20 NOTE — Patient Instructions (Signed)
Diabetes and Exercise Regular exercise is important and can help:   Control blood glucose (sugar).   Decrease blood pressure.   Control blood lipids (cholesterol and triglycerides).   Improve overall health.  BENEFITS FROM EXERCISE:  Improved fitness.   Improved flexibility.   Improved endurance.   Increased bone density.   Weight control.   Increased muscle strength.   Decreased body fat.   Improvement of the body's use of a hormone called insulin.   Increased insulin sensitivity.   Reduction of insulin needs.   Helps you feel better.   Reduces stress and tension.  People with diabetes who add exercise to their lifestyle gain additional benefits.   Weight loss.   Reduces appetite.   Improves body's use of blood glucose (sugar).   Decreases risk factors for heart disease:   Lowering of cholesterol and triglycerides.   Raising the level of good cholesterol (high-density lipoproteins [HDL]).   Lowering blood sugar.   Decreases blood pressure.  TYPE 1 DIABETES AND EXERCISE  Exercise will usually lower your blood glucose.   If blood glucose is greater than 240 mg/dl, check urine ketones. If ketones are present, do not exercise.   Location of the insulin injection sites may need to be adjusted with exercise. Avoid injecting insulin into areas of the body that will be exercised. For example, avoid injecting insulin into:   The arms when playing tennis.   The legs when jogging. For more information, discuss this with your caregiver.   Keep a record of:   Food intake.   Type and amount of exercise.   Expected peak times of insulin action.   Blood glucose (sugar) levels.  Do this before, during and after exercise. Review your records with your caregiver(s). This will help you to develop guidelines for adjusting food intake and/or insulin amounts.  TYPE 2 DIABETES AND EXERCISE  Regular physical activity can help control blood glucose.   Exercise is  important because it may:   Increase the body's sensitivity to insulin.   Improve blood glucose control.   Exercise reduces the risk of heart disease. It decreases serum cholesterol and triglycerides. It also lowers blood pressure.   Those who take insulin or oral hypoglycemic agents should watch for signs of hypoglycemia. These signs include dizziness, shaking, sweating, chills and confusion.   Body water is lost during exercise. It must be replaced. This will help to avoid loss of body fluids (dehydration) and/or heat stroke.  Be sure to talk to your caregiver before starting an exercise program to make sure it is safe for you. Remember, any activity is better than none.  Document Released: 03/08/2004 Document Re-Released: 10/14/2009 [employer] Patient Information 2011 [employer]. 

## 2011-06-20 NOTE — Progress Notes (Signed)
  Subjective:     Paige Fleming is a 45 y.o. female who presents for follow up of diabetes.. Current symptoms include: none. Patient denies foot ulcerations, hyperglycemia, hypoglycemia , increased appetite, nausea, paresthesia of the feet, polydipsia, polyuria, visual disturbances, vomiting and weight loss. Evaluation to date has been: fasting blood sugar, fasting lipid panel, hemoglobin A1C and microalbuminuria. Home sugars: patient does not check sugars. Current treatments: more intensive attention to diet which has been effective, increased aerobic exercise which has been effective, weight loss of 6 lbs which has been effective, Continued metformin which has been effective, Continued statin which has been effective and Continued ACE inhibitor/ARB which has been effective. Last dilated eye exam nov 2011.  The following portions of the patient's history were reviewed and updated as appropriate: allergies, current medications, past family history, past medical history, past social history, past surgical history and problem list.  Review of Systems Pertinent items are noted in HPI.    Objective:    BP 120/90  Pulse 111  Temp(Src) 98 F (36.7 C) (Oral)  Wt 167 lb 12.8 oz (76.114 kg)  SpO2 97% General appearance: alert, cooperative, appears stated age and no distress Head: Normocephalic, without obvious abnormality, atraumatic Eyes: conjunctivae/corneas clear. PERRL, EOM's intact. Fundi benign. Ears: normal TM's and external ear canals both ears Lungs: clear to auscultation bilaterally Heart: regular rate and rhythm, S1, S2 normal, no murmur, click, rub or gallop Extremities: extremities normal, atraumatic, no cyanosis or edema   Sensory exam of the foot is normal, tested with the monofilament. Good pulses, no lesions or ulcers, good peripheral pulses.  Patient was not evaluated for proper footwear and sizing.  Laboratory: No components found with this basename: A1C        Assessment:    Diabetes mellitus Type II, under good control.    Plan:    Addressed ADA diet. Encouraged aerobic exercise. Discussed foot care. Reminded to get yearly retinal exam. Continued metformin; see  medication orders. Continued statin drug see medication orders. Continued ACE inhibitor; see medication orders. Labs: fasting blood sugar, fasting lipid panel, hemoglobin A1C and microalbuminuria. Follow up in 3 months or as needed.

## 2011-06-21 ENCOUNTER — Ambulatory Visit
Admission: RE | Admit: 2011-06-21 | Discharge: 2011-06-21 | Disposition: A | Discharge status: Home / Self Care | Payer: Federal, State, Local not specified - PPO | Source: Ambulatory Visit | Attending: Family Medicine | Admitting: Family Medicine

## 2011-06-21 DIAGNOSIS — Z1231 Encounter for screening mammogram for malignant neoplasm of breast: Secondary | ICD-10-CM

## 2011-06-27 ENCOUNTER — Telehealth: Payer: Self-pay

## 2011-06-27 MED ORDER — SAXAGLIPTIN-METFORMIN ER 5-1000 MG PO TB24: 1.0000 | ORAL_TABLET | Freq: Every evening | ORAL | Status: DC

## 2011-06-27 NOTE — Telephone Encounter (Signed)
Discussed results with patient and she stated she was not taking the Crestor every day like she is supposed to, she stated she will start. Zetia not called in. Rx faxed to pharmacy.      KP

## 2011-06-27 NOTE — Telephone Encounter (Signed)
Message copied by Arnette Norris on Wed Jun 27, 2011  3:17 PM ------      Message from: Lelon Perla      Created: Mon Jun 25, 2011 10:05 PM       Is pt taking Crestor?  Cholesterol--- LDL goal < 70,  HDL >40,  TG < 150.  Diet and exercise will increase HDL and decrease LDL and TG.  Fish,  Fish Oil, Flaxseed oil will also help increase the HDL and decrease Triglycerides.   Recheck labs in 3 months.  If she is taking Crestor we need to add Zetia 10 mg #30 1 po qd ,  2 refills.        DM not controlleds -- change metformin to kombiglyza 04/999 1 po qd with evening #30   2 refills.  There is a coupon in the bin.    250.00  272.4  Lipid, hep, bmp, hgba1c, microalbumin.

## 2011-08-02 NOTE — Telephone Encounter (Signed)
Made in error

## 2011-09-18 ENCOUNTER — Ambulatory Visit (INDEPENDENT_AMBULATORY_CARE_PROVIDER_SITE_OTHER): Payer: Federal, State, Local not specified - PPO | Admitting: Family Medicine

## 2011-09-18 ENCOUNTER — Other Ambulatory Visit (HOSPITAL_COMMUNITY)
Admission: RE | Admit: 2011-09-18 | Discharge: 2011-09-18 | Disposition: A | Discharge status: Home / Self Care | Payer: Federal, State, Local not specified - PPO | Source: Ambulatory Visit | Attending: Family Medicine | Admitting: Family Medicine

## 2011-09-18 ENCOUNTER — Encounter: Payer: Self-pay | Admitting: Family Medicine

## 2011-09-18 VITALS — BP 150/90 | HR 99 | Temp 98.7°F | Ht 64.0 in | Wt 167.6 lb

## 2011-09-18 DIAGNOSIS — J309 Allergic rhinitis, unspecified: Secondary | ICD-10-CM

## 2011-09-18 DIAGNOSIS — E119 Type 2 diabetes mellitus without complications: Secondary | ICD-10-CM

## 2011-09-18 DIAGNOSIS — Z0000 Encounter for general adult medical examination without abnormal findings: Secondary | ICD-10-CM

## 2011-09-18 DIAGNOSIS — J302 Other seasonal allergic rhinitis: Secondary | ICD-10-CM

## 2011-09-18 DIAGNOSIS — Z01419 Encounter for gynecological examination (general) (routine) without abnormal findings: Secondary | ICD-10-CM | POA: Insufficient documentation

## 2011-09-18 DIAGNOSIS — I10 Essential (primary) hypertension: Secondary | ICD-10-CM

## 2011-09-18 LAB — CBC WITH DIFFERENTIAL/PLATELET
Basophils Relative: 0.3 % (ref 0.0–3.0)
Eosinophils Absolute: 0.1 10*3/uL (ref 0.0–0.7)
HCT: 37.9 % (ref 36.0–46.0)
Hemoglobin: 12.1 g/dL (ref 12.0–15.0)
Lymphocytes Relative: 25.8 % (ref 12.0–46.0)
Lymphs Abs: 1.3 10*3/uL (ref 0.7–4.0)
MCHC: 32 g/dL (ref 30.0–36.0)
MCV: 75.3 fl — ABNORMAL LOW (ref 78.0–100.0)
Neutro Abs: 3.2 10*3/uL (ref 1.4–7.7)
RBC: 5.04 Mil/uL (ref 3.87–5.11)

## 2011-09-18 LAB — LIPID PANEL
Cholesterol: 202 mg/dL — ABNORMAL HIGH (ref 0–200)
Triglycerides: 120 mg/dL (ref 0.0–149.0)

## 2011-09-18 LAB — BASIC METABOLIC PANEL
BUN: 12 mg/dL (ref 6–23)
Calcium: 9.2 mg/dL (ref 8.4–10.5)
Chloride: 102 mEq/L (ref 96–112)
Creatinine, Ser: 0.8 mg/dL (ref 0.4–1.2)

## 2011-09-18 LAB — MICROALBUMIN / CREATININE URINE RATIO
Creatinine,U: 240.5 mg/dL
Microalb Creat Ratio: 0.2 mg/g (ref 0.0–30.0)

## 2011-09-18 LAB — HEPATIC FUNCTION PANEL
ALT: 17 U/L (ref 0–35)
Total Bilirubin: 0.1 mg/dL — ABNORMAL LOW (ref 0.3–1.2)
Total Protein: 7.4 g/dL (ref 6.0–8.3)

## 2011-09-18 LAB — POCT URINALYSIS DIPSTICK
Glucose, UA: NEGATIVE
Leukocytes, UA: NEGATIVE
Nitrite, UA: NEGATIVE
Urobilinogen, UA: 0.2

## 2011-09-18 LAB — LDL CHOLESTEROL, DIRECT: Direct LDL: 134.9 mg/dL

## 2011-09-18 MED ORDER — AZELASTINE HCL 0.05 % OP SOLN: 1.0000 [drp] | Freq: Two times a day (BID) | OPHTHALMIC | Status: DC | PRN

## 2011-09-18 NOTE — Progress Notes (Signed)
Subjective:     Paige Fleming is a 45 y.o. female and is here for a comprehensive physical exam. The patient reports no problems.  History   Social History  . Marital Status: Married    Spouse Name: N/A    Number of Children: 2  . Years of Education: N/A   Occupational History  . [employer]   . personal processing specialist     [employer]   Social History Main Topics  . Smoking status: Never Smoker   . Smokeless tobacco: Not on file  . Alcohol Use: No     rare  . Drug Use: No  . Sexually Active: Yes -- Female partner(s)   Other Topics Concern  . Not on file   Social History Narrative  . No narrative on file   Health Maintenance  Topic Date Due  . Pap Smear  06/19/2013  . Tetanus/tdap  01/09/2021    The following portions of the patient's history were reviewed and updated as appropriate: allergies, current medications, past family history, past medical history, past social history, past surgical history and problem list.  Review of Systems Review of Systems  Constitutional: Negative for activity change, appetite change and fatigue.  HENT: Negative for hearing loss, congestion, tinnitus and ear discharge.  dentist q64m Eyes: Negative for visual disturbance (see optho q1y -- vision corrected to 20/20 with glasses).  Respiratory: Negative for cough, chest tightness and shortness of breath.   Cardiovascular: Negative for chest pain, palpitations and leg swelling.  Gastrointestinal: Negative for abdominal pain, diarrhea, constipation and abdominal distention.  Genitourinary: Negative for urgency, frequency, decreased urine volume and difficulty urinating.  Musculoskeletal: Negative for back pain, arthralgias and gait problem.  Skin: Negative for color change, pallor and rash.  Neurological: Negative for dizziness, light-headedness, numbness and headaches.  Hematological: Negative for adenopathy. Does not bruise/bleed easily.  Psychiatric/Behavioral: Negative for  suicidal ideas, confusion, sleep disturbance, self-injury, dysphoric mood, decreased concentration and agitation.       Objective:    BP 150/90  Pulse 99  Temp(Src) 98.7 F (37.1 C) (Oral)  Ht 5\' 4"  (1.626 m)  Wt 167 lb 9.6 oz (76.023 kg)  BMI 28.77 kg/m2  SpO2 97%  LMP 09/05/2011  General Appearance:    Alert, cooperative, no distress, appears stated age  Head:    Normocephalic, without obvious abnormality, atraumatic  Eyes:    PERRL, conjunctiva/corneas clear, EOM's intact, fundi    benign, both eyes  Ears:    Normal TM's and external ear canals, both ears  Nose:   Nares normal, septum midline, mucosa normal, no drainage    or sinus tenderness  Throat:   Lips, mucosa, and tongue normal; teeth and gums normal  Neck:   Supple, symmetrical, trachea midline, no adenopathy;    thyroid:  no enlargement/tenderness/nodules; no carotid   bruit or JVD  Back:     Symmetric, no curvature, ROM normal, no CVA tenderness  Lungs:     Clear to auscultation bilaterally, respirations unlabored  Chest Wall:    No tenderness or deformity   Heart:    Regular rate and rhythm, S1 and S2 normal, no murmur, rub   or gallop  Breast Exam:    No tenderness, masses, or nipple abnormality  Abdomen:     Soft, non-tender, bowel sounds active all four quadrants,    no masses, no organomegaly  Genitalia:    Normal female without lesion, discharge or tenderness  Rectal:    Normal tone,  normal prostate, no masses or tenderness;   guaiac negative stool  Extremities:   Extremities normal, atraumatic, no cyanosis or edema  Pulses:   2+ and symmetric all extremities  Skin:   Skin color, texture, turgor normal, no rashes or lesions  Lymph nodes:   Cervical, supraclavicular, and axillary nodes normal  Neurologic:   CNII-XII intact, normal strength, sensation and reflexes    throughout      Assessment:    Healthy female exam.  HTN-- not controlled,  Increase lisinopril DMII-- check labs Hyperlipidemia--  check labs, con't meds   Plan:  ghm utd  Check labs    See After Visit Summary for Counseling Recommendations

## 2011-09-20 ENCOUNTER — Ambulatory Visit: Payer: Federal, State, Local not specified - PPO | Admitting: Family Medicine

## 2011-09-26 ENCOUNTER — Encounter: Payer: Self-pay | Admitting: Family Medicine

## 2011-09-26 ENCOUNTER — Telehealth: Payer: Self-pay | Admitting: Family Medicine

## 2011-09-26 MED ORDER — EZETIMIBE 10 MG PO TABS: 10.0000 mg | ORAL_TABLET | Freq: Every day | ORAL | Status: DC

## 2011-09-26 MED ORDER — GLUCOSE BLOOD VI STRP: ORAL_STRIP | Status: DC

## 2011-09-26 NOTE — Telephone Encounter (Signed)
Discussed with patient and she voiced understanding, Rx has been faxed and patient is scheduled for tomorrow for Victoza instruction     KP

## 2011-09-26 NOTE — Telephone Encounter (Signed)
Message copied by Arnette Norris on Wed Sep 26, 2011 11:16 AM ------      Message from: Lelon Perla      Created: Thu Sep 20, 2011  5:14 PM       Dm not controlled---  Needs ov to discuss changing med to victoza      Lipids-- too high.  Cholesterol--- LDL goal < 70  HDL >50,  TG < 150.  Diet and exercise will increase HDL and decrease LDL and TG.  Fish,  Fish Oil, Flaxseed oil will also help increase the HDL and decrease Triglycerides.   Add zetia 10 mg 1 po qd #30  2 refills.  Recheck labs in  3 months.    272.4  250.00  hdl diagnostics---bmp, hep, hgba1c,

## 2011-09-27 ENCOUNTER — Encounter: Payer: Self-pay | Admitting: Family Medicine

## 2011-09-27 ENCOUNTER — Ambulatory Visit (INDEPENDENT_AMBULATORY_CARE_PROVIDER_SITE_OTHER): Payer: Federal, State, Local not specified - PPO | Admitting: Family Medicine

## 2011-09-27 DIAGNOSIS — E119 Type 2 diabetes mellitus without complications: Secondary | ICD-10-CM

## 2011-09-27 MED ORDER — LIRAGLUTIDE 18 MG/3ML ~~LOC~~ SOLN: SUBCUTANEOUS | Status: DC

## 2011-09-27 NOTE — Patient Instructions (Signed)
Diabetes Meal Planning Guide The diabetes meal planning guide is a tool to help you plan your meals and snacks. It is important for people with diabetes to manage their blood sugar levels. Choosing the right foods and the right amounts throughout your day will help control your blood sugar. Eating right can even help you improve your blood pressure and reach or maintain a healthy weight. CARBOHYDRATE COUNTING MADE EASY When you eat carbohydrates, they turn to sugar (glucose). This raises your blood sugar level. Counting carbohydrates can help you control this level so you feel better. When you plan your meals by counting carbohydrates, you can have more flexibility in what you eat and balance your medicine with your food intake. Carbohydrate counting simply means adding up the total amount of carbohydrate grams (g) in your meals or snacks. Try to eat about the same amount at each meal. Foods with carbohydrates are listed below. Each portion below is 1 carbohydrate serving or 15 grams of carbohydrates. Ask your dietician how many grams of carbohydrates you should eat at each meal or snack. Grains and Starches 1 slice bread 1/2 English muffin or hotdog/hamburger bun 3/4 cup cold cereal (unsweetened) 1/3 cup cooked pasta or rice 1/2 cup starchy vegetables (corn, potatoes, peas, beans, winter squash) 1 tortilla (6 inches) 1/4 bagel 1 waffle or pancake (size of a CD) 1/2 cup cooked cereal 4 to 6 small crackers *Whole grain is recommended Fruit 1 cup fresh unsweetened berries, melon, papaya, pineapple 1 small fresh fruit 1/2 banana or mango 1/2 cup fruit juice (4 ounces unsweetened) 1/2 cup canned fruit in natural juice or water 2 tablespoons dried fruit 12 to 15 grapes or cherries Milk and Yogurt 1 cup fat-free or 1% milk 1 cup soy milk 6 ounces light yogurt with sugar-free sweetener 6 ounces low-fat soy yogurt 6 ounces plain yogurt Vegetables 1 cup raw or 1/2 cup cooked is counted as 0  carbohydrates or a "free" food. If you eat 3 or more servings at one meal, count them as 1 carbohydrate serving. Other Carbohydrates 3/4 ounces chips or pretzels 1/2 cup ice cream or frozen yogurt 1/4 cup sherbet or sorbet 2 inch square cake, no frosting 1 tablespoon honey, sugar, jam, jelly, or syrup 2 small cookies 3 squares of graham crackers 3 cups popcorn 6 crackers 1 cup broth-based soup Count 1 cup casserole or other mixed foods as 2 carbohydrate servings. Foods with less than 20 calories in a serving may be counted as 0 carbohydrates or a "free" food. You may want to purchase a book or computer software that lists the carbohydrate gram counts of different foods. In addition, the nutrition facts panel on the labels of the foods you eat are a good source of this information. The label will tell you how big the serving size is and the total number of carbohydrate grams you will be eating per serving. Divide this number by 15 to obtain the number of carbohydrate servings in a portion. Remember: 1 carbohydrate serving equals 15 grams of carbohydrate. SERVING SIZES Measuring foods and serving sizes helps you make sure you are getting the right amount of food. The list below tells how big or small some common serving sizes are.  1 ounce (oz) of cheese.................................4 stacked dice.   2 to 3 oz cooked meat..................................Deck of cards.   1 teaspoon (tsp)............................................Tip of little finger.   1 tablespoon (tbs).........................................Thumb.   2 tbs.............................................................Golf ball.    cup...........................................................Half of a fist.   1 cup............................................................A fist.    SAMPLE DIABETES MEAL PLAN Below is a sample meal plan that includes foods from the grain and starches, dairy, vegetable, fruit, and  meat groups. A dietician can individualize a meal plan to fit your calorie needs and tell you the number of servings needed from each food group. However, controlling the total amount of carbohydrates in your meal or snack is more important than making sure you include all of the food groups at every meal. You may interchange carbohydrate containing foods (dairy, starches, and fruits). The meal plan below is an example of a 2000 calorie diet using carbohydrate counting. This meal plan has 17 carbohydrate servings (carb choices). Breakfast 1 cup oatmeal (2 carb choices) 3/4 cup light yogurt (1 carb choice) 1 cup blueberries (1 carb choice) 1/4 cup almonds  Snack 1 large apple (2 carb choices) 1 low-fat string cheese stick  Lunch Chicken breast salad:  1 cup spinach   1/4 cup chopped tomatoes   2 oz chicken breast, sliced   2 tbs low-fat Italian dressing  12 whole-wheat crackers (2 carb choices) 12 to 15 grapes (1 carb choice) 1 cup low-fat milk (1 carb choice)  Snack 1 cup carrots 1/2 cup hummus (1 carb choice)  Dinner 3 oz broiled salmon 1 cup brown rice (3 carb choices)  Snack 1 1/2 cups steamed broccoli (1 carb choice) drizzled with 1 tsp olive oil and lemon juice 1 cup light pudding (2 carb choices)  DIABETES MEAL PLANNING WORKSHEET Your dietician can use this worksheet to help you decide how many servings of foods and what types of foods are right for you.  Breakfast Food Group and Servings Carb Choices Grain/Starches _______________________________________ Dairy ______________________________________________ Vegetable _______________________________________ Fruit _______________________________________________ Meat _______________________________________________ Fat _____________________________________________ Lunch Food Group and Servings Carb Choices Grain/Starches ________________________________________ Dairy _______________________________________________ Fruit  ________________________________________________ Meat ________________________________________________ Fat _____________________________________________ Dinner Food Group and Servings Carb Choices Grain/Starches ________________________________________ Dairy _______________________________________________ Fruit ________________________________________________ Meat ________________________________________________ Fat _____________________________________________ Snacks Food Group and Servings Carb Choices Grain/Starches ________________________________________ Dairy _______________________________________________ Vegetable ________________________________________ Fruit ________________________________________________ Meat ________________________________________________ Fat _____________________________________________ Daily Totals Starches _________________________ Vegetable __________________________ Fruit ______________________________ Dairy ______________________________ Meat ______________________________ Fat ________________________________  Document Released: 09/13/2005 Document Re-Released: 06/06/2010 [employer] Patient Information 2011 [employer]. 

## 2011-09-28 ENCOUNTER — Encounter: Payer: Self-pay | Admitting: Family Medicine

## 2011-09-28 NOTE — Progress Notes (Signed)
  Subjective:    Patient ID: Paige Fleming, female    DOB: 09-Dec-1966, 45 y.o.   MRN: [id number]  HPI  Pt here to go over labs and learn how to inject Victoza.  No complaints.  Review of Systems    as above Objective:   Physical Exam  Constitutional: She appears well-developed and well-nourished.  Cardiovascular: Normal rate, regular rhythm and normal heart sounds.   Pulmonary/Chest: Effort normal and breath sounds normal. No respiratory distress. She has no wheezes. She has no rales.  Psychiatric: She has a normal mood and affect. Her behavior is normal. Judgment and thought content normal.          Assessment & Plan:  DMII---start Victoza 0.6  Mg SQ daily then inc to 1.2 mg ater 1 week           Nutrition and ho given           con't to check glucose bid

## 2011-10-22 ENCOUNTER — Ambulatory Visit (INDEPENDENT_AMBULATORY_CARE_PROVIDER_SITE_OTHER): Payer: Federal, State, Local not specified - PPO | Admitting: Family Medicine

## 2011-10-22 ENCOUNTER — Encounter: Payer: Self-pay | Admitting: Family Medicine

## 2011-10-22 VITALS — BP 142/90 | HR 87 | Temp 98.4°F | Wt 164.8 lb

## 2011-10-22 DIAGNOSIS — H6690 Otitis media, unspecified, unspecified ear: Secondary | ICD-10-CM

## 2011-10-22 MED ORDER — CEFUROXIME AXETIL 500 MG PO TABS: 500.0000 mg | ORAL_TABLET | Freq: Two times a day (BID) | ORAL | Status: AC

## 2011-10-22 MED ORDER — LISINOPRIL 20 MG PO TABS: 20.0000 mg | ORAL_TABLET | Freq: Every day | ORAL | Status: DC

## 2011-10-22 MED ORDER — OFLOXACIN 0.3 % OT SOLN: 10.0000 [drp] | Freq: Every day | OTIC | Status: AC

## 2011-10-22 NOTE — Patient Instructions (Signed)

## 2011-10-22 NOTE — Progress Notes (Signed)
  Subjective:    Patient ID: Paige Fleming, female    DOB: 18-Oct-1966, 45 y.o.   MRN: [id number]  HPI Pt here c/o pressure pain R ear.     Review of Systems    as above Objective:   Physical Exam  Constitutional: She appears well-developed and well-nourished.  HENT:       R ear---+ cerumen--- irrigated with no complications TM and canal red  Psychiatric: She has a normal mood and affect. Her behavior is normal. Judgment and thought content normal.          Assessment & Plan:  R ear-- + cerumen impaction ---irrigated            + R OM--- use floxin gtts and abx---rto prn

## 2011-10-28 ENCOUNTER — Other Ambulatory Visit: Payer: Self-pay | Admitting: Family Medicine

## 2012-02-24 ENCOUNTER — Other Ambulatory Visit: Payer: Self-pay | Admitting: Family Medicine

## 2012-03-06 ENCOUNTER — Encounter: Payer: Self-pay | Admitting: Family Medicine

## 2012-03-06 ENCOUNTER — Ambulatory Visit (INDEPENDENT_AMBULATORY_CARE_PROVIDER_SITE_OTHER): Payer: Federal, State, Local not specified - PPO | Admitting: Family Medicine

## 2012-03-06 VITALS — BP 156/92 | HR 102 | Temp 98.3°F | Wt 171.2 lb

## 2012-03-06 DIAGNOSIS — E119 Type 2 diabetes mellitus without complications: Secondary | ICD-10-CM

## 2012-03-06 DIAGNOSIS — I10 Essential (primary) hypertension: Secondary | ICD-10-CM

## 2012-03-06 DIAGNOSIS — E785 Hyperlipidemia, unspecified: Secondary | ICD-10-CM

## 2012-03-06 LAB — POCT URINALYSIS DIPSTICK
Blood, UA: NEGATIVE
Ketones, UA: NEGATIVE
Protein, UA: NEGATIVE
Spec Grav, UA: 1.01
pH, UA: 7.5

## 2012-03-06 MED ORDER — AMLODIPINE BESYLATE 5 MG PO TABS: 5.0000 mg | ORAL_TABLET | Freq: Every day | ORAL | Status: DC

## 2012-03-06 MED ORDER — LISINOPRIL-HYDROCHLOROTHIAZIDE 20-12.5 MG PO TABS: 1.0000 | ORAL_TABLET | Freq: Every day | ORAL | Status: DC

## 2012-03-06 MED ORDER — METFORMIN HCL ER 500 MG PO TB24: 1000.0000 mg | ORAL_TABLET | Freq: Every day | ORAL | Status: DC

## 2012-03-06 NOTE — Progress Notes (Signed)
  Subjective:    Patient here for follow-up of elevated blood pressure.  She is not exercising and is adherent to a low-salt diet.  Blood pressure is not checked at home. Cardiac symptoms: none. Patient denies: chest pain, chest pressure/discomfort, claudication, dyspnea, exertional chest pressure/discomfort, fatigue, irregular heart beat, lower extremity edema, near-syncope, orthopnea, palpitations, paroxysmal nocturnal dyspnea, syncope and tachypnea. Cardiovascular risk factors: diabetes mellitus, dyslipidemia, obesity (BMI >= 30 kg/m2) and sedentary lifestyle. Use of agents associated with hypertension: none. History of target organ damage: none.  The following portions of the patient's history were reviewed and updated as appropriate: allergies, current medications, past family history, past medical history, past social history, past surgical history and problem list.  Review of Systems Pertinent items are noted in HPI.     Objective:    BP 156/92  Pulse 102  Temp(Src) 98.3 F (36.8 C) (Oral)  Wt 171 lb 3.2 oz (77.656 kg)  SpO2 98% General appearance: alert, cooperative, appears stated age and no distress Lungs: clear to auscultation bilaterally Heart: S1, S2 normal Extremities: extremities normal, atraumatic, no cyanosis or edema    Assessment:    Hypertension, stage 1 . Evidence of target organ damage: none.   dm II Hyperlipidemia---check labs---  con't meds Plan:    Medication: begin norvasc 5 mg and increase to lisinopril hct. Dietary sodium restriction. Regular aerobic exercise. Check blood pressures 2-3 times weekly and record. Follow up: 2 weeks and as needed.

## 2012-03-06 NOTE — Patient Instructions (Signed)
Hypertension As your heart beats, it forces blood through your arteries. This force is your blood pressure. If the pressure is too high, it is called hypertension (HTN) or high blood pressure. HTN is dangerous because you may have it and not know it. High blood pressure may mean that your heart has to work harder to pump blood. Your arteries may be narrow or stiff. The extra work puts you at risk for heart disease, stroke, and other problems.  Blood pressure consists of two numbers, a higher number over a lower, 110/72, for example. It is stated as "110 over 72." The ideal is below 120 for the top number (systolic) and under 80 for the bottom (diastolic). Write down your blood pressure today. You should pay close attention to your blood pressure if you have certain conditions such as:  Heart failure.   Prior heart attack.   Diabetes   Chronic kidney disease.   Prior stroke.   Multiple risk factors for heart disease.  To see if you have HTN, your blood pressure should be measured while you are seated with your arm held at the level of the heart. It should be measured at least twice. A one-time elevated blood pressure reading (especially in the Emergency Department) does not mean that you need treatment. There may be conditions in which the blood pressure is different between your right and left arms. It is important to see your caregiver soon for a recheck. Most people have essential hypertension which means that there is not a specific cause. This type of high blood pressure may be lowered by changing lifestyle factors such as:  Stress.   Smoking.   Lack of exercise.   Excessive weight.   Drug/tobacco/alcohol use.   Eating less salt.  Most people do not have symptoms from high blood pressure until it has caused damage to the body. Effective treatment can often prevent, delay or reduce that damage. TREATMENT  When a cause has been identified, treatment for high blood pressure is  directed at the cause. There are a large number of medications to treat HTN. These fall into several categories, and your caregiver will help you select the medicines that are best for you. Medications may have side effects. You should review side effects with your caregiver. If your blood pressure stays high after you have made lifestyle changes or started on medicines,   Your medication(s) may need to be changed.   Other problems may need to be addressed.   Be certain you understand your prescriptions, and know how and when to take your medicine.   Be sure to follow up with your caregiver within the time frame advised (usually within two weeks) to have your blood pressure rechecked and to review your medications.   If you are taking more than one medicine to lower your blood pressure, make sure you know how and at what times they should be taken. Taking two medicines at the same time can result in blood pressure that is too low.  SEEK IMMEDIATE MEDICAL CARE IF:  You develop a severe headache, blurred or changing vision, or confusion.   You have unusual weakness or numbness, or a faint feeling.   You have severe chest or abdominal pain, vomiting, or breathing problems.  MAKE SURE YOU:   Understand these instructions.   Will watch your condition.   Will get help right away if you are not doing well or get worse.  Document Released: 12/17/2005 Document Revised: 12/06/2011 Document Reviewed:   08/06/2008 [employer] Patient Information 2012 [employer].Cholesterol Control Diet Cholesterol levels in your body are determined significantly by your diet. Cholesterol levels may also be related to heart disease. The following material helps to explain this relationship and discusses what you can do to help keep your heart healthy. Not all cholesterol is bad. Low-density lipoprotein (LDL) cholesterol is the "bad" cholesterol. It may cause fatty deposits to build up inside your arteries.  High-density lipoprotein (HDL) cholesterol is "good." It helps to remove the "bad" LDL cholesterol from your blood. Cholesterol is a very important risk factor for heart disease. Other risk factors are high blood pressure, smoking, stress, heredity, and weight. The heart muscle gets its supply of blood through the coronary arteries. If your LDL cholesterol is high and your HDL cholesterol is low, you are at risk for having fatty deposits build up in your coronary arteries. This leaves less room through which blood can flow. Without sufficient blood and oxygen, the heart muscle cannot function properly and you may feel chest pains (angina pectoris). When a coronary artery closes up entirely, a part of the heart muscle may die, causing a heart attack (myocardial infarction). CHECKING CHOLESTEROL When your caregiver sends your blood to a lab to be analyzed for cholesterol, a complete lipid (fat) profile may be done. With this test, the total amount of cholesterol and levels of LDL and HDL are determined. Triglycerides are a type of fat that circulates in the blood and can also be used to determine heart disease risk. The list below describes what the numbers should be: Test: Total Cholesterol.  Less than 200 mg/dl.  Test: LDL "bad cholesterol."  Less than 100 mg/dl.   Less than 70 mg/dl if you are at very high risk of a heart attack or sudden cardiac death.  Test: HDL "good cholesterol."  Greater than 50 mg/dl for women.   Greater than 40 mg/dl for men.  Test: Triglycerides.  Less than 150 mg/dl.  CONTROLLING CHOLESTEROL WITH DIET Although exercise and lifestyle factors are important, your diet is key. That is because certain foods are known to raise cholesterol and others to lower it. The goal is to balance foods for their effect on cholesterol and more importantly, to replace saturated and trans fat with other types of fat, such as monounsaturated fat, polyunsaturated fat, and omega-3 fatty  acids. On average, a person should consume no more than 15 to 17 g of saturated fat daily. Saturated and trans fats are considered "bad" fats, and they will raise LDL cholesterol. Saturated fats are primarily found in animal products such as meats, butter, and cream. However, that does not mean you need to sacrifice all your favorite foods. Today, there are good tasting, low-fat, low-cholesterol substitutes for most of the things you like to eat. Choose low-fat or nonfat alternatives. Choose round or loin cuts of red meat, since these types of cuts are lowest in fat and cholesterol. Chicken (without the skin), fish, veal, and ground turkey breast are excellent choices. Eliminate fatty meats, such as hot dogs and salami. Even shellfish have little or no saturated fat. Have a 3 oz (85 g) portion when you eat lean meat, poultry, or fish. Trans fats are also called "partially hydrogenated oils." They are oils that have been scientifically manipulated so that they are solid at room temperature resulting in a longer shelf life and improved taste and texture of foods in which they are added. Trans fats are found in stick margarine, some tub margarines,   cookies, crackers, and baked goods.  When baking and cooking, oils are an excellent substitute for butter. The monounsaturated oils are especially beneficial since it is believed they lower LDL and raise HDL. The oils you should avoid entirely are saturated tropical oils, such as coconut and palm.  Remember to eat liberally from food groups that are naturally free of saturated and trans fat, including fish, fruit, vegetables, beans, grains (barley, rice, couscous, bulgur wheat), and pasta (without cream sauces).  IDENTIFYING FOODS THAT LOWER CHOLESTEROL  Soluble fiber may lower your cholesterol. This type of fiber is found in fruits such as apples, vegetables such as broccoli, potatoes, and carrots, legumes such as beans, peas, and lentils, and grains such as barley.  Foods fortified with plant sterols (phytosterol) may also lower cholesterol. You should eat at least 2 g per day of these foods for a cholesterol lowering effect.  Read package labels to identify low-saturated fats, trans fats free, and low-fat foods at the supermarket. Select cheeses that have only 2 to 3 g saturated fat per ounce. Use a heart-healthy tub margarine that is free of trans fats or partially hydrogenated oil. When buying baked goods (cookies, crackers), avoid partially hydrogenated oils. Breads and muffins should be made from whole grains (whole-wheat or whole oat flour, instead of "flour" or "enriched flour"). Buy non-creamy canned soups with reduced salt and no added fats.  FOOD PREPARATION TECHNIQUES  Never deep-fry. If you must fry, either stir-fry, which uses very little fat, or use non-stick cooking sprays. When possible, broil, bake, or roast meats, and steam vegetables. Instead of dressing vegetables with butter or margarine, use lemon and herbs, applesauce and cinnamon (for squash and sweet potatoes), nonfat yogurt, salsa, and low-fat dressings for salads.  LOW-SATURATED FAT / LOW-FAT FOOD SUBSTITUTES Meats / Saturated Fat (g)  Avoid: Steak, marbled (3 oz/85 g) / 11 g   Choose: Steak, lean (3 oz/85 g) / 4 g   Avoid: Hamburger (3 oz/85 g) / 7 g   Choose: Hamburger, lean (3 oz/85 g) / 5 g   Avoid: Ham (3 oz/85 g) / 6 g   Choose: Ham, lean cut (3 oz/85 g) / 2.4 g   Avoid: Chicken, with skin, dark meat (3 oz/85 g) / 4 g   Choose: Chicken, skin removed, dark meat (3 oz/85 g) / 2 g   Avoid: Chicken, with skin, light meat (3 oz/85 g) / 2.5 g   Choose: Chicken, skin removed, light meat (3 oz/85 g) / 1 g  Dairy / Saturated Fat (g)  Avoid: Whole milk (1 cup) / 5 g   Choose: Low-fat milk, 2% (1 cup) / 3 g   Choose: Low-fat milk, 1% (1 cup) / 1.5 g   Choose: Skim milk (1 cup) / 0.3 g   Avoid: Hard cheese (1 oz/28 g) / 6 g   Choose: Skim milk cheese (1 oz/28 g) / 2 to  3 g   Avoid: Cottage cheese, 4% fat (1 cup) / 6.5 g   Choose: Low-fat cottage cheese, 1% fat (1 cup) / 1.5 g   Avoid: Ice cream (1 cup) / 9 g   Choose: Sherbet (1 cup) / 2.5 g   Choose: Nonfat frozen yogurt (1 cup) / 0.3 g   Choose: Frozen fruit bar / trace   Avoid: Whipped cream (1 tbs) / 3.5 g   Choose: Nondairy whipped topping (1 tbs) / 1 g  Condiments / Saturated Fat (g)  Avoid: Mayonnaise (1 tbs) /   2 g   Choose: Low-fat mayonnaise (1 tbs) / 1 g   Avoid: Butter (1 tbs) / 7 g   Choose: Extra light margarine (1 tbs) / 1 g   Avoid: Coconut oil (1 tbs) / 11.8 g   Choose: Olive oil (1 tbs) / 1.8 g   Choose: Corn oil (1 tbs) / 1.7 g   Choose: Safflower oil (1 tbs) / 1.2 g   Choose: Sunflower oil (1 tbs) / 1.4 g   Choose: Soybean oil (1 tbs) / 2.4 g   Choose: Canola oil (1 tbs) / 1 g  Document Released: 12/17/2005 Document Revised: 12/06/2011 Document Reviewed: 06/07/2011 [employer] Patient Information 2012 [employer]. 

## 2012-03-07 LAB — LIPID PANEL
HDL: 55.5 mg/dL (ref >39.00)
Total CHOL/HDL Ratio: 3

## 2012-03-07 LAB — CBC WITH DIFFERENTIAL/PLATELET
Basophils Absolute: 0 10*3/uL (ref 0.0–0.1)
Basophils Relative: 0.9 % (ref 0.0–3.0)
Hemoglobin: 12.6 g/dL (ref 12.0–15.0)
Lymphocytes Relative: 75.8 % — ABNORMAL HIGH (ref 12.0–46.0)
Monocytes Relative: 15.4 % — ABNORMAL HIGH (ref 3.0–12.0)
Neutro Abs: 0.1 10*3/uL — ABNORMAL LOW (ref 1.4–7.7)
RBC: 5.1 Mil/uL (ref 3.87–5.11)
RDW: 15.2 % — ABNORMAL HIGH (ref 11.5–14.6)

## 2012-03-07 LAB — BASIC METABOLIC PANEL
Calcium: 9.8 mg/dL (ref 8.4–10.5)
GFR: 79.49 mL/min (ref >60.00)
Sodium: 138 mEq/L (ref 135–145)

## 2012-03-07 LAB — HEPATIC FUNCTION PANEL
Alkaline Phosphatase: 76 U/L (ref 39–117)
Bilirubin, Direct: 0 mg/dL (ref 0.0–0.3)
Total Protein: 7.1 g/dL (ref 6.0–8.3)

## 2012-03-07 LAB — MICROALBUMIN / CREATININE URINE RATIO: Microalb, Ur: 1.1 mg/dL (ref 0.0–1.9)

## 2012-03-10 ENCOUNTER — Other Ambulatory Visit: Payer: Self-pay | Admitting: Family Medicine

## 2012-03-10 DIAGNOSIS — I10 Essential (primary) hypertension: Secondary | ICD-10-CM

## 2012-03-10 DIAGNOSIS — E1165 Type 2 diabetes mellitus with hyperglycemia: Secondary | ICD-10-CM

## 2012-03-10 DIAGNOSIS — E785 Hyperlipidemia, unspecified: Secondary | ICD-10-CM

## 2012-03-12 ENCOUNTER — Encounter: Payer: Self-pay | Admitting: *Deleted

## 2012-03-13 ENCOUNTER — Other Ambulatory Visit (INDEPENDENT_AMBULATORY_CARE_PROVIDER_SITE_OTHER): Payer: Federal, State, Local not specified - PPO

## 2012-03-13 DIAGNOSIS — E119 Type 2 diabetes mellitus without complications: Secondary | ICD-10-CM

## 2012-03-13 DIAGNOSIS — I10 Essential (primary) hypertension: Secondary | ICD-10-CM

## 2012-03-13 DIAGNOSIS — E785 Hyperlipidemia, unspecified: Secondary | ICD-10-CM

## 2012-03-13 DIAGNOSIS — IMO0001 Reserved for inherently not codable concepts without codable children: Secondary | ICD-10-CM

## 2012-03-13 DIAGNOSIS — E1165 Type 2 diabetes mellitus with hyperglycemia: Secondary | ICD-10-CM

## 2012-03-14 LAB — MICROALBUMIN / CREATININE URINE RATIO: Microalb Creat Ratio: 0.3 mg/g (ref 0.0–30.0)

## 2012-03-14 LAB — CBC WITH DIFFERENTIAL/PLATELET
Basophils Relative: 0.2 % (ref 0.0–3.0)
Eosinophils Relative: 3.1 % (ref 0.0–5.0)
HCT: 39.5 % (ref 36.0–46.0)
Lymphs Abs: 2.9 10*3/uL (ref 0.7–4.0)
MCV: 77.6 fl — ABNORMAL LOW (ref 78.0–100.0)
Monocytes Absolute: 0.8 10*3/uL (ref 0.1–1.0)
Monocytes Relative: 14.6 % — ABNORMAL HIGH (ref 3.0–12.0)
Neutrophils Relative %: 28.8 % — ABNORMAL LOW (ref 43.0–77.0)
RBC: 5.09 Mil/uL (ref 3.87–5.11)
WBC: 5.4 10*3/uL (ref 4.5–10.5)

## 2012-03-14 LAB — HEPATIC FUNCTION PANEL
Albumin: 4.1 g/dL (ref 3.5–5.2)
Total Protein: 7.5 g/dL (ref 6.0–8.3)

## 2012-03-14 LAB — LIPID PANEL
Cholesterol: 167 mg/dL (ref 0–200)
HDL: 54.2 mg/dL (ref >39.00)
Triglycerides: 240 mg/dL — ABNORMAL HIGH (ref 0.0–149.0)
VLDL: 48 mg/dL — ABNORMAL HIGH (ref 0.0–40.0)

## 2012-03-14 LAB — BASIC METABOLIC PANEL
Chloride: 96 mEq/L (ref 96–112)
GFR: 78.55 mL/min (ref >60.00)
Potassium: 4 mEq/L (ref 3.5–5.1)

## 2012-03-18 ENCOUNTER — Encounter: Payer: Self-pay | Admitting: Family Medicine

## 2012-03-18 ENCOUNTER — Other Ambulatory Visit: Payer: Self-pay | Admitting: Family Medicine

## 2012-03-18 DIAGNOSIS — D7289 Other specified disorders of white blood cells: Secondary | ICD-10-CM

## 2012-03-20 ENCOUNTER — Encounter: Payer: Self-pay | Admitting: Family Medicine

## 2012-03-20 ENCOUNTER — Ambulatory Visit (INDEPENDENT_AMBULATORY_CARE_PROVIDER_SITE_OTHER): Payer: Federal, State, Local not specified - PPO | Admitting: Family Medicine

## 2012-03-20 VITALS — BP 138/80 | HR 109 | Resp 16 | Wt 167.4 lb

## 2012-03-20 DIAGNOSIS — I10 Essential (primary) hypertension: Secondary | ICD-10-CM

## 2012-03-20 DIAGNOSIS — E119 Type 2 diabetes mellitus without complications: Secondary | ICD-10-CM

## 2012-03-20 DIAGNOSIS — E785 Hyperlipidemia, unspecified: Secondary | ICD-10-CM

## 2012-03-20 DIAGNOSIS — E1165 Type 2 diabetes mellitus with hyperglycemia: Secondary | ICD-10-CM

## 2012-03-20 MED ORDER — CETIRIZINE HCL 10 MG PO TABS: 10.0000 mg | ORAL_TABLET | Freq: Every day | ORAL | Status: DC

## 2012-03-20 MED ORDER — MONTELUKAST SODIUM 10 MG PO TABS: 10.0000 mg | ORAL_TABLET | Freq: Every day | ORAL | Status: DC | PRN

## 2012-03-20 NOTE — Assessment & Plan Note (Signed)
Pt admits to not being consistent with victoza She will slowly inc to 1.8 nmg

## 2012-03-20 NOTE — Progress Notes (Signed)
  Subjective:    Patient here for follow-up of elevated blood pressure.  She is exercising and is adherent to a low-salt diet.  Blood pressure is well controlled at home. Cardiac symptoms: none. Patient denies: chest pain, chest pressure/discomfort, claudication, dyspnea, exertional chest pressure/discomfort, fatigue, irregular heart beat, lower extremity edema, near-syncope, orthopnea, palpitations, paroxysmal nocturnal dyspnea, syncope and tachypnea. Cardiovascular risk factors: diabetes mellitus, dyslipidemia, hypertension, obesity (BMI >= 30 kg/m2) and sedentary lifestyle. Use of agents associated with hypertension: none. History of target organ damage: none.  The following portions of the patient's history were reviewed and updated as appropriate: allergies, current medications, past family history, past medical history, past social history, past surgical history and problem list.  Review of Systems Pertinent items are noted in HPI.     Objective:    BP 138/80  Pulse 109  Resp 16  Wt 167 lb 6.4 oz (75.932 kg)  SpO2 96% General appearance: alert, cooperative, appears stated age and no distress Lungs: clear to auscultation bilaterally Heart: S1, S2 normal Extremities: extremities normal, atraumatic, no cyanosis or edema    Assessment:    Hypertension, normal blood pressure . Evidence of target organ damage: none.    Plan:    Medication: no change. Dietary sodium restriction. Regular aerobic exercise. Follow up: 3 months and as needed.

## 2012-03-20 NOTE — Patient Instructions (Signed)

## 2012-03-20 NOTE — Assessment & Plan Note (Signed)
Labs discussed with pt con't meds Recheck 3 months

## 2012-04-17 ENCOUNTER — Other Ambulatory Visit: Payer: Self-pay | Admitting: Family Medicine

## 2012-04-17 DIAGNOSIS — I10 Essential (primary) hypertension: Secondary | ICD-10-CM

## 2012-04-17 NOTE — Telephone Encounter (Signed)
REFILL Amlodipine Besylate 5MG  Tablet Qty 30 Take 1-tablet by mouth total  Last filled 4.2.13  Last OV 03.21.13

## 2012-04-18 MED ORDER — AMLODIPINE BESYLATE 5 MG PO TABS: 5.0000 mg | ORAL_TABLET | Freq: Every day | ORAL | Status: DC

## 2012-04-18 NOTE — Telephone Encounter (Signed)
Refill done.  

## 2012-05-20 ENCOUNTER — Other Ambulatory Visit: Payer: Self-pay | Admitting: Family Medicine

## 2012-05-20 DIAGNOSIS — Z1231 Encounter for screening mammogram for malignant neoplasm of breast: Secondary | ICD-10-CM

## 2012-06-21 ENCOUNTER — Other Ambulatory Visit: Payer: Self-pay | Admitting: Family Medicine

## 2012-06-23 ENCOUNTER — Ambulatory Visit
Admission: RE | Admit: 2012-06-23 | Discharge: 2012-06-23 | Disposition: A | Discharge status: Home / Self Care | Payer: Federal, State, Local not specified - PPO | Source: Ambulatory Visit | Attending: Family Medicine | Admitting: Family Medicine

## 2012-06-23 DIAGNOSIS — Z1231 Encounter for screening mammogram for malignant neoplasm of breast: Secondary | ICD-10-CM

## 2012-11-03 ENCOUNTER — Ambulatory Visit (INDEPENDENT_AMBULATORY_CARE_PROVIDER_SITE_OTHER): Payer: Federal, State, Local not specified - PPO | Admitting: Family Medicine

## 2012-11-03 ENCOUNTER — Encounter: Payer: Self-pay | Admitting: Family Medicine

## 2012-11-03 VITALS — BP 156/92 | HR 84 | Temp 98.9°F | Wt 156.2 lb

## 2012-11-03 DIAGNOSIS — E119 Type 2 diabetes mellitus without complications: Secondary | ICD-10-CM

## 2012-11-03 DIAGNOSIS — I10 Essential (primary) hypertension: Secondary | ICD-10-CM

## 2012-11-03 DIAGNOSIS — F4321 Adjustment disorder with depressed mood: Secondary | ICD-10-CM

## 2012-11-03 MED ORDER — LISINOPRIL-HYDROCHLOROTHIAZIDE 20-12.5 MG PO TABS: 2.0000 | ORAL_TABLET | Freq: Every day | ORAL | Status: DC

## 2012-11-03 MED ORDER — LIRAGLUTIDE 18 MG/3ML ~~LOC~~ SOLN: SUBCUTANEOUS | Status: DC

## 2012-11-03 NOTE — Assessment & Plan Note (Signed)
Pt will call hospice for counseling She prefers no meds at this time

## 2012-11-03 NOTE — Assessment & Plan Note (Signed)
Inc lisinopril rto 2 weeks

## 2012-11-03 NOTE — Patient Instructions (Addendum)
Hypertension As your heart beats, it forces blood through your arteries. This force is your blood pressure. If the pressure is too high, it is called hypertension (HTN) or high blood pressure. HTN is dangerous because you may have it and not know it. High blood pressure may mean that your heart has to work harder to pump blood. Your arteries may be narrow or stiff. The extra work puts you at risk for heart disease, stroke, and other problems.  Blood pressure consists of two numbers, a higher number over a lower, 110/72, for example. It is stated as "110 over 72." The ideal is below 120 for the top number (systolic) and under 80 for the bottom (diastolic). Write down your blood pressure today. You should pay close attention to your blood pressure if you have certain conditions such as:  Heart failure.  Prior heart attack.  Diabetes  Chronic kidney disease.  Prior stroke.  Multiple risk factors for heart disease. To see if you have HTN, your blood pressure should be measured while you are seated with your arm held at the level of the heart. It should be measured at least twice. A one-time elevated blood pressure reading (especially in the Emergency Department) does not mean that you need treatment. There may be conditions in which the blood pressure is different between your right and left arms. It is important to see your caregiver soon for a recheck. Most people have essential hypertension which means that there is not a specific cause. This type of high blood pressure may be lowered by changing lifestyle factors such as:  Stress.  Smoking.  Lack of exercise.  Excessive weight.  Drug/tobacco/alcohol use.  Eating less salt. Most people do not have symptoms from high blood pressure until it has caused damage to the body. Effective treatment can often prevent, delay or reduce that damage. TREATMENT  When a cause has been identified, treatment for high blood pressure is directed at the  cause. There are a large number of medications to treat HTN. These fall into several categories, and your caregiver will help you select the medicines that are best for you. Medications may have side effects. You should review side effects with your caregiver. If your blood pressure stays high after you have made lifestyle changes or started on medicines,   Your medication(s) may need to be changed.  Other problems may need to be addressed.  Be certain you understand your prescriptions, and know how and when to take your medicine.  Be sure to follow up with your caregiver within the time frame advised (usually within two weeks) to have your blood pressure rechecked and to review your medications.  If you are taking more than one medicine to lower your blood pressure, make sure you know how and at what times they should be taken. Taking two medicines at the same time can result in blood pressure that is too low. SEEK IMMEDIATE MEDICAL CARE IF:  You develop a severe headache, blurred or changing vision, or confusion.  You have unusual weakness or numbness, or a faint feeling.  You have severe chest or abdominal pain, vomiting, or breathing problems. MAKE SURE YOU:   Understand these instructions.  Will watch your condition.  Will get help right away if you are not doing well or get worse. Document Released: 12/17/2005 Document Revised: 03/10/2012 Document Reviewed: 08/06/2008 [facility] Patient Information 2013 [place], [place].   Grief Reaction Grief is a normal response to the death of someone close to  you. Feelings of fear, anger, and guilt can affect almost everyone who loses someone they love. Symptoms of depression are also common. These include problems with sleep, loss of appetite, and lack of energy. These grief reaction symptoms often last for weeks to months after a loss. They may also return during special times that remind you of the person you lost, such as an anniversary or  birthday. Anxiety, insomnia, irritability, and deep depression may last beyond the period of normal grief. If you experience these feelings for 6 months or longer, you may have clinical depression. Clinical depression requires further medical attention. If you think that you have clinical depression, you should contact your caregiver. If you have a history of depression and or a family history of depression, you are at greater risk of clinical depression. You are also at greater risk of developing clinical depression if the loss was traumatic or the loss was of someone with whom you had unresolved issues.  A grief reaction can become complicated by being blocked. This means being unable to cry or express extreme emotions. This may prolong the grieving period and worsen the emotional effects of the loss. Mourning is a natural event in human life. A healthy grief reaction is one that is not blocked . It requires a time of sadness and readjustment.It is very important to share your sorrow and fear with others, especially close friends and family. Professional counselors and clergy can also help you process your grief. Document Released: 12/17/2005 Document Revised: 03/10/2012 Document Reviewed: 08/27/2006 [facility] Patient Information 2013 [place], [place].

## 2012-11-03 NOTE — Progress Notes (Signed)
  Subjective:    Patient ID: Paige Fleming, female    DOB: 23-Jul-1966, 46 y.o.   MRN: [id number]  HPI Pt here c/o severe depression, decreased appetite since her mom passed away up [place] last month. Pt is crying a lot and sleeping off and on.   She does not want meds , however.      Review of Systems As above    Objective:   Physical Exam  Constitutional: She is oriented to person, place, and time. She appears well-developed and well-nourished.  Neurological: She is alert and oriented to person, place, and time.  Psychiatric: Her behavior is normal. Judgment and thought content normal.       Pt is not suicidal  , homicidal           Assessment & Plan:

## 2012-11-17 ENCOUNTER — Encounter: Payer: Self-pay | Admitting: Family Medicine

## 2012-11-17 MED ORDER — AZELASTINE HCL 0.15 % NA SOLN: 2.0000 | Freq: Every day | NASAL | Status: DC

## 2012-11-24 ENCOUNTER — Ambulatory Visit (INDEPENDENT_AMBULATORY_CARE_PROVIDER_SITE_OTHER): Payer: Federal, State, Local not specified - PPO | Admitting: Family Medicine

## 2012-11-24 ENCOUNTER — Encounter: Payer: Self-pay | Admitting: Family Medicine

## 2012-11-24 VITALS — BP 124/80 | HR 95 | Temp 98.4°F | Wt 157.4 lb

## 2012-11-24 DIAGNOSIS — I10 Essential (primary) hypertension: Secondary | ICD-10-CM

## 2012-11-24 DIAGNOSIS — E119 Type 2 diabetes mellitus without complications: Secondary | ICD-10-CM

## 2012-11-24 DIAGNOSIS — E785 Hyperlipidemia, unspecified: Secondary | ICD-10-CM

## 2012-11-24 DIAGNOSIS — Z0000 Encounter for general adult medical examination without abnormal findings: Secondary | ICD-10-CM

## 2012-11-24 LAB — POCT URINALYSIS DIPSTICK
Bilirubin, UA: NEGATIVE
Glucose, UA: NEGATIVE
Ketones, UA: NEGATIVE
Leukocytes, UA: NEGATIVE

## 2012-11-24 LAB — BASIC METABOLIC PANEL
CO2: 26 mEq/L (ref 19–32)
Calcium: 9.3 mg/dL (ref 8.4–10.5)
Chloride: 104 mEq/L (ref 96–112)
Glucose, Bld: 99 mg/dL (ref 70–99)
Potassium: 3.7 mEq/L (ref 3.5–5.1)
Sodium: 138 mEq/L (ref 135–145)

## 2012-11-24 LAB — CBC WITH DIFFERENTIAL/PLATELET
Eosinophils Relative: 2.7 % (ref 0.0–5.0)
HCT: 39.3 % (ref 36.0–46.0)
Hemoglobin: 12.4 g/dL (ref 12.0–15.0)
Lymphs Abs: 1.6 10*3/uL (ref 0.7–4.0)
Monocytes Relative: 6.5 % (ref 3.0–12.0)
Neutro Abs: 3.3 10*3/uL (ref 1.4–7.7)
Platelets: 261 10*3/uL (ref 150.0–400.0)
WBC: 5.4 10*3/uL (ref 4.5–10.5)

## 2012-11-24 LAB — HEPATIC FUNCTION PANEL
ALT: 15 U/L (ref 0–35)
Total Bilirubin: 0.2 mg/dL — ABNORMAL LOW (ref 0.3–1.2)
Total Protein: 7.8 g/dL (ref 6.0–8.3)

## 2012-11-24 LAB — TSH: TSH: 1.16 u[IU]/mL (ref 0.35–5.50)

## 2012-11-24 LAB — LIPID PANEL
Cholesterol: 240 mg/dL — ABNORMAL HIGH (ref 0–200)
Triglycerides: 162 mg/dL — ABNORMAL HIGH (ref 0.0–149.0)

## 2012-11-24 MED ORDER — INSULIN PEN NEEDLE 32G X 6 MM MISC: Status: DC

## 2012-11-24 MED ORDER — LIRAGLUTIDE 18 MG/3ML ~~LOC~~ SOLN: SUBCUTANEOUS | Status: DC

## 2012-11-24 MED ORDER — B COMPLEX VITAMINS SL LIQD: SUBLINGUAL | Status: DC

## 2012-11-24 NOTE — Assessment & Plan Note (Signed)
Stable con't meds 

## 2012-11-24 NOTE — Progress Notes (Signed)
  Subjective:    Patient ID: Paige Fleming, female    DOB: 08/27/1966, 46 y.o.   MRN: [id number]  HPI Pt here for f/u.   HYPERTENSION Disease Monitoring Blood pressure range- good Chest pain- no      Dyspnea- no Medications Compliance- good Lightheadedness- no   Edema- no   DIABETES Disease Monitoring Blood Sugar ranges-90-130 Polyuria- no New Visual problems- no Medications Compliance- good Hypoglycemic symptoms- no   HYPERLIPIDEMIA Disease Monitoring See symptoms for Hypertension Medications Compliance- good RUQ pain- no  Muscle aches- no  ROS See HPI above   PMH Smoking Status noted     Review of Systems as above   Objective:   Physical Exam  BP 124/80  Pulse 95  Temp 98.4 F (36.9 C) (Oral)  Wt 157 lb 6.4 oz (71.396 kg)  SpO2 98% General appearance: alert, cooperative, appears stated age and no distress Lungs: clear to auscultation bilaterally Heart: S1, S2 normal Extremities: extremities normal, atraumatic, no cyanosis or edema Sensory exam of the foot is normal, tested with the monofilament. Good pulses, no lesions or ulcers, good peripheral pulses.       Assessment & Plan:

## 2012-11-24 NOTE — Patient Instructions (Addendum)
Diabetes and Standards of Medical Care  Diabetes is complicated. You may find that your diabetes team includes a dietitian, nurse, diabetes educator, eye doctor, and more. To help everyone know what is going on and to help you get the care you deserve, the following schedule of care was developed to help keep you on track. Below are the tests, exams, vaccines, medicines, education, and plans you will need. A1c test  Performed at least 2 times a year if you are meeting treatment goals.  Performed 4 times a year if therapy has changed or if you are not meeting therapy/glycemic goals. Aspirin medicine  Take daily as directed by your caregiver. Blood pressure test  Performed at every routine medical visit. The goal is less than 130/80 mm/Hg. Dental exam  Get a dental exam at least 2 times a year. Dilated eye exam (retinal exam)  Type 1 diabetes: Get an exam within 5 years of diagnosis and then yearly.  Type 2 diabetes: Get an exam at diagnosis and then yearly. All exams thereafter can be extended to every 2 to 3 years if one or more exams have been normal. Foot care exam  Visual foot exams are performed at every routine medical visit. The exams check for cuts, injuries, or other problems with the feet.  A comprehensive foot exam should be done yearly. This includes visual inspection as well as assessing foot pulses and testing for loss of sensation. Kidney function test (urine microalbumin)  Performed once a year.  Type 1 diabetes: The first test is performed 5 years after diagnosis.  Type 2 diabetes: The first test is performed at the time of diagnosis.  A serum creatinine and estimated glomerular filtration rate (eGFR) test is done once a year to tell the level of chronic kidney disease (CKD), if present. Lipid profile (Cholesterol, HDL, LDL, Triglycerides)  Performed once a year for most people. If at low risk, may be assessed every 2 years.  The goal for LDL is less than 100  mg/dl. If at high risk, the goal is less than 70 mg/dl.  The goal for HDL is higher than 40 mg/dl for men and higher than 50 mg/dl for women.  The goal for triglycerides is less than 150 mg/dl. Flu vaccine, pneumonia vaccine, and hepatitis B vaccine  The flu vaccine is recommended yearly.  The pneumonia vaccine is generally given once in a lifetime. However, there are some instances where another vaccine is recommended. Check with your caregiver.  The hepatitis B vaccine is also recommended for adults with diabetes. Diabetes self-management education  Recommended at diagnosis and ongoing as needed. Treatment plan  Reviewed at every medical visit. Document Released: 10/14/2009 Document Revised: 03/10/2012 Document Reviewed: 06/19/2011 [employer] Patient Information 2013 [employer].  

## 2012-11-24 NOTE — Assessment & Plan Note (Signed)
con't meds  Check labs 

## 2012-11-24 NOTE — Assessment & Plan Note (Signed)
Check labs con't meds 

## 2012-11-26 ENCOUNTER — Other Ambulatory Visit: Payer: Self-pay

## 2012-11-26 MED ORDER — FENOFIBRATE MICRONIZED 130 MG PO CAPS: 130.0000 mg | ORAL_CAPSULE | Freq: Every day | ORAL | Status: DC

## 2012-11-26 NOTE — Telephone Encounter (Signed)
Antara does not come in 160. Please advise    KP

## 2012-11-26 NOTE — Telephone Encounter (Signed)
Message copied by Arnette Norris on Wed Nov 26, 2012 12:05 PM ------      Message from: Lelon Perla      Created: Mon Nov 24, 2012  8:47 PM       Add antara 160 #30  1 po qd , 2 refills--- give coupon      Increase victoza 1.8 mg       Recheck 3 months-----  250.00  272.4  Lipid hep,  Bmp, [id number]

## 2012-11-26 NOTE — Telephone Encounter (Signed)
Per Dr Laury Axon Verbal to do the Antara 130.   Vm left making the patient aware Rx faxed and results released to mychart.  KP

## 2012-11-26 NOTE — Telephone Encounter (Signed)
145mg 

## 2012-12-08 ENCOUNTER — Other Ambulatory Visit: Payer: Self-pay | Admitting: Obstetrics and Gynecology

## 2012-12-08 DIAGNOSIS — N644 Mastodynia: Secondary | ICD-10-CM

## 2012-12-16 ENCOUNTER — Ambulatory Visit
Admission: RE | Admit: 2012-12-16 | Discharge: 2012-12-16 | Disposition: A | Discharge status: Home / Self Care | Payer: Federal, State, Local not specified - PPO | Source: Ambulatory Visit | Attending: Obstetrics and Gynecology | Admitting: Obstetrics and Gynecology

## 2012-12-16 DIAGNOSIS — N644 Mastodynia: Secondary | ICD-10-CM

## 2013-01-26 ENCOUNTER — Encounter: Payer: Federal, State, Local not specified - PPO | Admitting: Family Medicine

## 2013-02-14 ENCOUNTER — Other Ambulatory Visit: Payer: Self-pay

## 2013-02-21 ENCOUNTER — Other Ambulatory Visit: Payer: Self-pay | Admitting: Family Medicine

## 2013-03-30 ENCOUNTER — Telehealth: Payer: Self-pay | Admitting: Family Medicine

## 2013-03-30 NOTE — Telephone Encounter (Signed)
Refill: One touch ultra test strips. Check blood sugar once daily. Qty 100. Last fill 09-26-11

## 2013-03-31 MED ORDER — GLUCOSE BLOOD VI STRP: ORAL_STRIP | Status: DC

## 2013-04-02 ENCOUNTER — Other Ambulatory Visit: Payer: Self-pay | Admitting: Family Medicine

## 2013-05-28 ENCOUNTER — Other Ambulatory Visit: Payer: Self-pay

## 2013-05-28 DIAGNOSIS — Z1231 Encounter for screening mammogram for malignant neoplasm of breast: Secondary | ICD-10-CM

## 2013-06-24 ENCOUNTER — Ambulatory Visit
Admission: RE | Admit: 2013-06-24 | Discharge: 2013-06-24 | Disposition: A | Discharge status: Home / Self Care | Payer: Federal, State, Local not specified - PPO | Source: Ambulatory Visit

## 2013-06-24 DIAGNOSIS — Z1231 Encounter for screening mammogram for malignant neoplasm of breast: Secondary | ICD-10-CM

## 2013-07-06 ENCOUNTER — Encounter: Payer: Self-pay | Admitting: Family Medicine

## 2013-07-06 ENCOUNTER — Ambulatory Visit (HOSPITAL_BASED_OUTPATIENT_CLINIC_OR_DEPARTMENT_OTHER)
Admission: RE | Admit: 2013-07-06 | Discharge: 2013-07-06 | Disposition: A | Discharge status: Home / Self Care | Payer: Federal, State, Local not specified - PPO | Source: Ambulatory Visit | Attending: Family Medicine | Admitting: Family Medicine

## 2013-07-06 ENCOUNTER — Ambulatory Visit (INDEPENDENT_AMBULATORY_CARE_PROVIDER_SITE_OTHER): Payer: Federal, State, Local not specified - PPO | Admitting: Family Medicine

## 2013-07-06 VITALS — BP 144/82 | HR 90 | Temp 98.5°F | Ht 63.0 in | Wt 169.2 lb

## 2013-07-06 DIAGNOSIS — E785 Hyperlipidemia, unspecified: Secondary | ICD-10-CM

## 2013-07-06 DIAGNOSIS — M7989 Other specified soft tissue disorders: Secondary | ICD-10-CM | POA: Insufficient documentation

## 2013-07-06 DIAGNOSIS — IMO0002 Reserved for concepts with insufficient information to code with codable children: Secondary | ICD-10-CM

## 2013-07-06 DIAGNOSIS — L03019 Cellulitis of unspecified finger: Secondary | ICD-10-CM

## 2013-07-06 DIAGNOSIS — M79609 Pain in unspecified limb: Secondary | ICD-10-CM | POA: Insufficient documentation

## 2013-07-06 DIAGNOSIS — L02519 Cutaneous abscess of unspecified hand: Secondary | ICD-10-CM

## 2013-07-06 DIAGNOSIS — E119 Type 2 diabetes mellitus without complications: Secondary | ICD-10-CM

## 2013-07-06 DIAGNOSIS — I10 Essential (primary) hypertension: Secondary | ICD-10-CM

## 2013-07-06 DIAGNOSIS — Z0000 Encounter for general adult medical examination without abnormal findings: Secondary | ICD-10-CM

## 2013-07-06 DIAGNOSIS — E1159 Type 2 diabetes mellitus with other circulatory complications: Secondary | ICD-10-CM

## 2013-07-06 MED ORDER — CEPHALEXIN 500 MG PO CAPS: 500.0000 mg | ORAL_CAPSULE | Freq: Two times a day (BID) | ORAL | Status: DC

## 2013-07-06 NOTE — Progress Notes (Signed)
Subjective:     Paige Fleming is a 47 y.o. female and is here for a comprehensive physical exam. The patient reports no problems.  History   Social History  . Marital Status: Married    Spouse Name: N/A    Number of Children: 2  . Years of Education: N/A   Occupational History  . [employer]   . personal processing specialist     [employer]   Social History Main Topics  . Smoking status: Never Smoker   . Smokeless tobacco: Not on file  . Alcohol Use: No     Comment: rare  . Drug Use: No  . Sexually Active: Yes -- Female partner(s)   Other Topics Concern  . Not on file   Social History Narrative   Exercise-- walks in am   Health Maintenance  Topic Date Due  . Influenza Vaccine  08/31/2013  . Pap Smear  09/17/2014  . Tetanus/tdap  01/09/2021    The following portions of the patient's history were reviewed and updated as appropriate:  She  has a past medical history of Gestational diabetes mellitus (2001); Allergic rhinitis; Asthma; Hypertension; Hyperlipidemia; Diabetes mellitus type II; and Abortion history (1995). She  does not have any pertinent problems on file. She  has past surgical history that includes Tubal ligation (2001) and Cervical biopsy (2000). Her family history includes Allergies in an unspecified family member; Arthritis in her maternal grandmother and mother; Asthma in an unspecified family member; Breast cancer in an unspecified family member; Breast cancer (age of onset: 74) in her mother; Cancer in her mother; Crohn's disease in her sister; Diabetes in her father, mother, and paternal aunt; Heart disease (age of onset: 39) in her maternal grandfather; Hyperlipidemia in her father and mother; and Hypertension in her maternal aunt, maternal grandmother, and mother. She  reports that she has never smoked. She does not have any smokeless tobacco history on file. She reports that she does not drink alcohol or use illicit drugs. She has a current  medication list which includes the following prescription(s): azelastine hcl, b complex vitamins, crestor, fenofibrate micronized, glucose blood, insulin pen needle, levocetirizine, liraglutide, lisinopril-hydrochlorothiazide, metformin, montelukast, olopatadine hcl, onetouch delica lancets, and cephalexin. Current Outpatient Prescriptions on File Prior to Visit  Medication Sig Dispense Refill  . Azelastine HCl (ASTEPRO) 0.15 % SOLN Place 2 sprays into both nostrils daily.  30 mL  11  . B Complex Vitamins LIQD 1 gtt qid  3 Bottle  3  . CRESTOR 20 MG tablet TAKE 1 TABLET BY MOUTH AT BEDTIME  30 tablet  1  . fenofibrate micronized (ANTARA) 130 MG capsule Take 1 capsule (130 mg total) by mouth daily before breakfast.  30 capsule  2  . glucose blood (ONE TOUCH ULTRA TEST) test strip check blood sugar once daily  100 each  5  . Insulin Pen Needle 32G X 6 MM MISC As directed  100 each  11  . Liraglutide 18 MG/3ML SOLN 1.8 mg  SQ daily      . lisinopril-hydrochlorothiazide (ZESTORETIC) 20-12.5 MG per tablet Take 2 tablets by mouth daily.  180 tablet  3  . metFORMIN (GLUCOPHAGE XR) 500 MG 24 hr tablet Take 2 tablets (1,000 mg total) by mouth daily with breakfast.  180 tablet  3  . montelukast (SINGULAIR) 10 MG tablet Take 1 tablet (10 mg total) by mouth daily as needed.  90 tablet  3  . ONETOUCH DELICA LANCETS MISC Check BS daily  100 each  1  . [DISCONTINUED] lisinopril (PRINIVIL,ZESTRIL) 20 MG tablet Take 1 tablet (20 mg total) by mouth daily.  30 tablet  2  . [DISCONTINUED] Saxagliptin-Metformin 04-999 MG TB24 Take 1 tablet by mouth every evening.  30 tablet  2   No current facility-administered medications on file prior to visit.   She has No Known Allergies..  Review of Systems Review of Systems  Constitutional: Negative for activity change, appetite change and fatigue.  HENT: Negative for hearing loss, congestion, tinnitus and ear discharge.  dentist -due Eyes: Negative for visual disturbance  (see optho q1y -- due)  Respiratory: Negative for cough, chest tightness and shortness of breath.   Cardiovascular: Negative for chest pain, palpitations and leg swelling.  Gastrointestinal: Negative for abdominal pain, diarrhea, constipation and abdominal distention.  Genitourinary: Negative for urgency, frequency, decreased urine volume and difficulty urinating.  Musculoskeletal: Negative for back pain, arthralgias and gait problem.  Skin: Negative for color change, pallor and rash.  Neurological: Negative for dizziness, light-headedness, numbness and headaches.  Hematological: Negative for adenopathy. Does not bruise/bleed easily.  Psychiatric/Behavioral: Negative for suicidal ideas, confusion, sleep disturbance, self-injury, dysphoric mood, decreased concentration and agitation.       Objective:    BP 144/82  Pulse 90  Temp(Src) 98.5 F (36.9 C) (Oral)  Ht 5\' 3"  (1.6 m)  Wt 169 lb 3.2 oz (76.749 kg)  BMI 29.98 kg/m2  SpO2 99%  LMP 06/20/2013 General appearance: alert, cooperative, appears stated age and no distress Head: Normocephalic, without obvious abnormality, atraumatic Eyes: conjunctivae/corneas clear. PERRL, EOM's intact. Fundi benign. Ears: normal TM's and external ear canals both ears Nose: Nares normal. Septum midline. Mucosa normal. No drainage or sinus tenderness. Throat: lips, mucosa, and tongue normal; teeth and gums normal Neck: no adenopathy, no carotid bruit, no JVD, supple, symmetrical, trachea midline and thyroid not enlarged, symmetric, no tenderness/mass/nodules Back: symmetric, no curvature. ROM normal. No CVA tenderness. Lungs: clear to auscultation bilaterally Breasts: gyn Heart: regular rate and rhythm, S1, S2 normal, no murmur, click, rub or gallop Abdomen: soft, non-tender; bowel sounds normal; no masses,  no organomegaly Pelvic: deferred -gyn Extremities: extremities normal, atraumatic, no cyanosis or edema Pulses: 2+ and symmetric Skin: Skin  color, texture, turgor normal. No rashes or lesions Lymph nodes: Cervical, supraclavicular, and axillary nodes normal. Neurologic: Alert and oriented X 3, normal strength and tone. Normal symmetric reflexes. Normal coordination and gait Psych--no anxiety, no depression      Assessment:    Healthy female exam.      Plan:    ghm utd Check labs See After Visit Summary for Counseling Recommendations

## 2013-07-06 NOTE — Assessment & Plan Note (Signed)
Stable---  Slightly elevated toda

## 2013-07-06 NOTE — Assessment & Plan Note (Signed)
See meds and orders for abx Check xray

## 2013-07-06 NOTE — Assessment & Plan Note (Signed)
Check labs 

## 2013-07-06 NOTE — Assessment & Plan Note (Signed)
Cont meds Check labs 

## 2013-07-06 NOTE — Patient Instructions (Addendum)
Preventive Care for Adults, Female A healthy lifestyle and preventive care can promote health and wellness. Preventive health guidelines for women include the following key practices.  A routine yearly physical is a good way to check with your caregiver about your health and preventive screening. It is a chance to share any concerns and updates on your health, and to receive a thorough exam.  Visit your dentist for a routine exam and preventive care every 6 months. Brush your teeth twice a day and floss once a day. Good oral hygiene prevents tooth decay and gum disease.  The frequency of eye exams is based on your age, health, family medical history, use of contact lenses, and other factors. Follow your caregiver's recommendations for frequency of eye exams.  Eat a healthy diet. Foods like vegetables, fruits, whole grains, low-fat dairy products, and lean protein foods contain the nutrients you need without too many calories. Decrease your intake of foods high in solid fats, added sugars, and salt. Eat the right amount of calories for you.Get information about a proper diet from your caregiver, if necessary.  Regular physical exercise is one of the most important things you can do for your health. Most adults should get at least 150 minutes of moderate-intensity exercise (any activity that increases your heart rate and causes you to sweat) each week. In addition, most adults need muscle-strengthening exercises on 2 or more days a week.  Maintain a healthy weight. The body mass index (BMI) is a screening tool to identify possible weight problems. It provides an estimate of body fat based on height and weight. Your caregiver can help determine your BMI, and can help you achieve or maintain a healthy weight.For adults 20 years and older:  A BMI below 18.5 is considered underweight.  A BMI of 18.5 to 24.9 is normal.  A BMI of 25 to 29.9 is considered overweight.  A BMI of 30 and above is  considered obese.  Maintain normal blood lipids and cholesterol levels by exercising and minimizing your intake of saturated fat. Eat a balanced diet with plenty of fruit and vegetables. Blood tests for lipids and cholesterol should begin at age 20 and be repeated every 5 years. If your lipid or cholesterol levels are high, you are over 50, or you are at high risk for heart disease, you may need your cholesterol levels checked more frequently.Ongoing high lipid and cholesterol levels should be treated with medicines if diet and exercise are not effective.  If you smoke, find out from your caregiver how to quit. If you do not use tobacco, do not start.  If you are pregnant, do not drink alcohol. If you are breastfeeding, be very cautious about drinking alcohol. If you are not pregnant and choose to drink alcohol, do not exceed 1 drink per day. One drink is considered to be 12 ounces (355 mL) of beer, 5 ounces (148 mL) of wine, or 1.5 ounces (44 mL) of liquor.  Avoid use of street drugs. Do not share needles with anyone. Ask for help if you need support or instructions about stopping the use of drugs.  High blood pressure causes heart disease and increases the risk of stroke. Your blood pressure should be checked at least every 1 to 2 years. Ongoing high blood pressure should be treated with medicines if weight loss and exercise are not effective.  If you are 55 to 47 years old, ask your caregiver if you should take aspirin to prevent strokes.  Diabetes   screening involves taking a blood sample to check your fasting blood sugar level. This should be done once every 3 years, after age 45, if you are within normal weight and without risk factors for diabetes. Testing should be considered at a younger age or be carried out more frequently if you are overweight and have at least 1 risk factor for diabetes.  Breast cancer screening is essential preventive care for women. You should practice "breast  self-awareness." This means understanding the normal appearance and feel of your breasts and may include breast self-examination. Any changes detected, no matter how small, should be reported to a caregiver. Women in their 20s and 30s should have a clinical breast exam (CBE) by a caregiver as part of a regular health exam every 1 to 3 years. After age 40, women should have a CBE every year. Starting at age 40, women should consider having a mammography (breast X-ray test) every year. Women who have a family history of breast cancer should talk to their caregiver about genetic screening. Women at a high risk of breast cancer should talk to their caregivers about having magnetic resonance imaging (MRI) and a mammography every year.  The Pap test is a screening test for cervical cancer. A Pap test can show cell changes on the cervix that might become cervical cancer if left untreated. A Pap test is a procedure in which cells are obtained and examined from the lower end of the uterus (cervix).  Women should have a Pap test starting at age 21.  Between ages 21 and 29, Pap tests should be repeated every 2 years.  Beginning at age 30, you should have a Pap test every 3 years as long as the past 3 Pap tests have been normal.  Some women have medical problems that increase the chance of getting cervical cancer. Talk to your caregiver about these problems. It is especially important to talk to your caregiver if a new problem develops soon after your last Pap test. In these cases, your caregiver may recommend more frequent screening and Pap tests.  The above recommendations are the same for women who have or have not gotten the vaccine for human papillomavirus (HPV).  If you had a hysterectomy for a problem that was not cancer or a condition that could lead to cancer, then you no longer need Pap tests. Even if you no longer need a Pap test, a regular exam is a good idea to make sure no other problems are  starting.  If you are between ages 65 and 70, and you have had normal Pap tests going back 10 years, you no longer need Pap tests. Even if you no longer need a Pap test, a regular exam is a good idea to make sure no other problems are starting.  If you have had past treatment for cervical cancer or a condition that could lead to cancer, you need Pap tests and screening for cancer for at least 20 years after your treatment.  If Pap tests have been discontinued, risk factors (such as a new sexual partner) need to be reassessed to determine if screening should be resumed.  The HPV test is an additional test that may be used for cervical cancer screening. The HPV test looks for the virus that can cause the cell changes on the cervix. The cells collected during the Pap test can be tested for HPV. The HPV test could be used to screen women aged 30 years and older, and should   be used in women of any age who have unclear Pap test results. After the age of 30, women should have HPV testing at the same frequency as a Pap test.  Colorectal cancer can be detected and often prevented. Most routine colorectal cancer screening begins at the age of 50 and continues through age 75. However, your caregiver may recommend screening at an earlier age if you have risk factors for colon cancer. On a yearly basis, your caregiver may provide home test kits to check for hidden blood in the stool. Use of a small camera at the end of a tube, to directly examine the colon (sigmoidoscopy or colonoscopy), can detect the earliest forms of colorectal cancer. Talk to your caregiver about this at age 50, when routine screening begins. Direct examination of the colon should be repeated every 5 to 10 years through age 75, unless early forms of pre-cancerous polyps or small growths are found.  Hepatitis C blood testing is recommended for all people born from 1945 through 1965 and any individual with known risks for hepatitis C.  Practice  safe sex. Use condoms and avoid high-risk sexual practices to reduce the spread of sexually transmitted infections (STIs). STIs include gonorrhea, chlamydia, syphilis, trichomonas, herpes, HPV, and human immunodeficiency virus (HIV). Herpes, HIV, and HPV are viral illnesses that have no cure. They can result in disability, cancer, and death. Sexually active women aged 25 and younger should be checked for chlamydia. Older women with new or multiple partners should also be tested for chlamydia. Testing for other STIs is recommended if you are sexually active and at increased risk.  Osteoporosis is a disease in which the bones lose minerals and strength with aging. This can result in serious bone fractures. The risk of osteoporosis can be identified using a bone density scan. Women ages 65 and over and women at risk for fractures or osteoporosis should discuss screening with their caregivers. Ask your caregiver whether you should take a calcium supplement or vitamin D to reduce the rate of osteoporosis.  Menopause can be associated with physical symptoms and risks. Hormone replacement therapy is available to decrease symptoms and risks. You should talk to your caregiver about whether hormone replacement therapy is right for you.  Use sunscreen with sun protection factor (SPF) of 30 or more. Apply sunscreen liberally and repeatedly throughout the day. You should seek shade when your shadow is shorter than you. Protect yourself by wearing long sleeves, pants, a wide-brimmed hat, and sunglasses year round, whenever you are outdoors.  Once a month, do a whole body skin exam, using a mirror to look at the skin on your back. Notify your caregiver of new moles, moles that have irregular borders, moles that are larger than a pencil eraser, or moles that have changed in shape or color.  Stay current with required immunizations.  Influenza. You need a dose every fall (or winter). The composition of the flu vaccine  changes each year, so being vaccinated once is not enough.  Pneumococcal polysaccharide. You need 1 to 2 doses if you smoke cigarettes or if you have certain chronic medical conditions. You need 1 dose at age 65 (or older) if you have never been vaccinated.  Tetanus, diphtheria, pertussis (Tdap, Td). Get 1 dose of Tdap vaccine if you are younger than age 65, are over 65 and have contact with an infant, are a healthcare worker, are pregnant, or simply want to be protected from whooping cough. After that, you need a Td   booster dose every 10 years. Consult your caregiver if you have not had at least 3 tetanus and diphtheria-containing shots sometime in your life or have a deep or dirty wound.  HPV. You need this vaccine if you are a woman age 26 or younger. The vaccine is given in 3 doses over 6 months.  Measles, mumps, rubella (MMR). You need at least 1 dose of MMR if you were born in 1957 or later. You may also need a second dose.  Meningococcal. If you are age 19 to 21 and a first-year college student living in a residence hall, or have one of several medical conditions, you need to get vaccinated against meningococcal disease. You may also need additional booster doses.  Zoster (shingles). If you are age 60 or older, you should get this vaccine.  Varicella (chickenpox). If you have never had chickenpox or you were vaccinated but received only 1 dose, talk to your caregiver to find out if you need this vaccine.  Hepatitis A. You need this vaccine if you have a specific risk factor for hepatitis A virus infection or you simply wish to be protected from this disease. The vaccine is usually given as 2 doses, 6 to 18 months apart.  Hepatitis B. You need this vaccine if you have a specific risk factor for hepatitis B virus infection or you simply wish to be protected from this disease. The vaccine is given in 3 doses, usually over 6 months. Preventive Services / Frequency Ages 19 to 39  Blood  pressure check.** / Every 1 to 2 years.  Lipid and cholesterol check.** / Every 5 years beginning at age 20.  Clinical breast exam.** / Every 3 years for women in their 20s and 30s.  Pap test.** / Every 2 years from ages 21 through 29. Every 3 years starting at age 30 through age 65 or 70 with a history of 3 consecutive normal Pap tests.  HPV screening.** / Every 3 years from ages 30 through ages 65 to 70 with a history of 3 consecutive normal Pap tests.  Hepatitis C blood test.** / For any individual with known risks for hepatitis C.  Skin self-exam. / Monthly.  Influenza immunization.** / Every year.  Pneumococcal polysaccharide immunization.** / 1 to 2 doses if you smoke cigarettes or if you have certain chronic medical conditions.  Tetanus, diphtheria, pertussis (Tdap, Td) immunization. / A one-time dose of Tdap vaccine. After that, you need a Td booster dose every 10 years.  HPV immunization. / 3 doses over 6 months, if you are 26 and younger.  Measles, mumps, rubella (MMR) immunization. / You need at least 1 dose of MMR if you were born in 1957 or later. You may also need a second dose.  Meningococcal immunization. / 1 dose if you are age 19 to 21 and a first-year college student living in a residence hall, or have one of several medical conditions, you need to get vaccinated against meningococcal disease. You may also need additional booster doses.  Varicella immunization.** / Consult your caregiver.  Hepatitis A immunization.** / Consult your caregiver. 2 doses, 6 to 18 months apart.  Hepatitis B immunization.** / Consult your caregiver. 3 doses usually over 6 months. Ages 40 to 64  Blood pressure check.** / Every 1 to 2 years.  Lipid and cholesterol check.** / Every 5 years beginning at age 20.  Clinical breast exam.** / Every year after age 40.  Mammogram.** / Every year beginning at age 40   and continuing for as long as you are in good health. Consult with your  caregiver.  Pap test.** / Every 3 years starting at age 30 through age 65 or 70 with a history of 3 consecutive normal Pap tests.  HPV screening.** / Every 3 years from ages 30 through ages 65 to 70 with a history of 3 consecutive normal Pap tests.  Fecal occult blood test (FOBT) of stool. / Every year beginning at age 50 and continuing until age 75. You may not need to do this test if you get a colonoscopy every 10 years.  Flexible sigmoidoscopy or colonoscopy.** / Every 5 years for a flexible sigmoidoscopy or every 10 years for a colonoscopy beginning at age 50 and continuing until age 75.  Hepatitis C blood test.** / For all people born from 1945 through 1965 and any individual with known risks for hepatitis C.  Skin self-exam. / Monthly.  Influenza immunization.** / Every year.  Pneumococcal polysaccharide immunization.** / 1 to 2 doses if you smoke cigarettes or if you have certain chronic medical conditions.  Tetanus, diphtheria, pertussis (Tdap, Td) immunization.** / A one-time dose of Tdap vaccine. After that, you need a Td booster dose every 10 years.  Measles, mumps, rubella (MMR) immunization. / You need at least 1 dose of MMR if you were born in 1957 or later. You may also need a second dose.  Varicella immunization.** / Consult your caregiver.  Meningococcal immunization.** / Consult your caregiver.  Hepatitis A immunization.** / Consult your caregiver. 2 doses, 6 to 18 months apart.  Hepatitis B immunization.** / Consult your caregiver. 3 doses, usually over 6 months. Ages 65 and over  Blood pressure check.** / Every 1 to 2 years.  Lipid and cholesterol check.** / Every 5 years beginning at age 20.  Clinical breast exam.** / Every year after age 40.  Mammogram.** / Every year beginning at age 40 and continuing for as long as you are in good health. Consult with your caregiver.  Pap test.** / Every 3 years starting at age 30 through age 65 or 70 with a 3  consecutive normal Pap tests. Testing can be stopped between 65 and 70 with 3 consecutive normal Pap tests and no abnormal Pap or HPV tests in the past 10 years.  HPV screening.** / Every 3 years from ages 30 through ages 65 or 70 with a history of 3 consecutive normal Pap tests. Testing can be stopped between 65 and 70 with 3 consecutive normal Pap tests and no abnormal Pap or HPV tests in the past 10 years.  Fecal occult blood test (FOBT) of stool. / Every year beginning at age 50 and continuing until age 75. You may not need to do this test if you get a colonoscopy every 10 years.  Flexible sigmoidoscopy or colonoscopy.** / Every 5 years for a flexible sigmoidoscopy or every 10 years for a colonoscopy beginning at age 50 and continuing until age 75.  Hepatitis C blood test.** / For all people born from 1945 through 1965 and any individual with known risks for hepatitis C.  Osteoporosis screening.** / A one-time screening for women ages 65 and over and women at risk for fractures or osteoporosis.  Skin self-exam. / Monthly.  Influenza immunization.** / Every year.  Pneumococcal polysaccharide immunization.** / 1 dose at age 65 (or older) if you have never been vaccinated.  Tetanus, diphtheria, pertussis (Tdap, Td) immunization. / A one-time dose of Tdap vaccine if you are over   65 and have contact with an infant, are a healthcare worker, or simply want to be protected from whooping cough. After that, you need a Td booster dose every 10 years.  Varicella immunization.** / Consult your caregiver.  Meningococcal immunization.** / Consult your caregiver.  Hepatitis A immunization.** / Consult your caregiver. 2 doses, 6 to 18 months apart.  Hepatitis B immunization.** / Check with your caregiver. 3 doses, usually over 6 months. ** Family history and personal history of risk and conditions may change your caregiver's recommendations. Document Released: 02/12/2002 Document Revised: 03/10/2012  Document Reviewed: 05/14/2011 [employer] Patient Information 2014 [employer].  

## 2013-07-07 ENCOUNTER — Telehealth: Payer: Self-pay | Admitting: *Deleted

## 2013-07-07 NOTE — Telephone Encounter (Signed)
Spoke with pt after she returned my call, advised X-ray showed no fracture but to continue with the ABX and to call us back if swelling no better by Thursday.

## 2013-07-08 ENCOUNTER — Telehealth: Payer: Self-pay | Admitting: Family Medicine

## 2013-07-08 NOTE — Telephone Encounter (Signed)
Patient Information:  Caller Name: Jenniffer  Phone: [phone]  Patient: Sena, Hoopingarner  Gender: Female  DOB: 24-Oct-1966  Age: 47 Years  PCP: Lelon Perla.  Pregnant: No  Office Follow Up:  Does the office need to follow up with this patient?: Yes  Instructions For The Office: Please notify Stephine when finger Xrays have been faxed to [facility]   Symptoms  Reason For Call & Symptoms: Waniya states she was seen in office on 07/06/13. States she had onset of swelling to index finger on 07/05/13. States Antibiotic was ordered. States swelling is better but still present. Jaquana wants to be seen at [facility] and Alisa Graff  ASAP- office number [phone].  States  [facility] will not schedule appointment until results of xray of finger is faxed to their office. PER Amarianna- PLEASE FAX RESULTS OF FINGER XRAY TO [facility] AND [facility] OFFICE- FAX # [phone].  Meko REQUESTED TO BE NOTIFIED WHEN REPORTS HAVE BEEN FAXED.  Reviewed Health History In EMR: Yes  Reviewed Medications In EMR: Yes  Reviewed Allergies In EMR: Yes  Reviewed Surgeries / Procedures: Yes  Date of Onset of Symptoms: 07/05/2013  Treatments Tried: ABX  Treatments Tried Worked: No OB / GYN:  LMP: 06/14/2013  Guideline(s) Used:  No Protocol Available - Sick Adult  Disposition Per Guideline:   Discuss with PCP and Callback by Nurse Today  Reason For Disposition Reached:   Nursing judgment  Advice Given:  N/A  Patient Will Follow Care Advice:  YES

## 2013-07-08 NOTE — Telephone Encounter (Signed)
Please advise      KP 

## 2013-07-09 ENCOUNTER — Telehealth: Payer: Self-pay | Admitting: Family Medicine

## 2013-07-09 NOTE — Telephone Encounter (Signed)
xrays are done

## 2013-07-09 NOTE — Telephone Encounter (Signed)
I made the patient aware.     KP

## 2013-07-09 NOTE — Telephone Encounter (Signed)
Report faxed and msg left making the patient aware.     KP

## 2013-07-09 NOTE — Telephone Encounter (Signed)
[facility] called and states that they received the patient's x-ray results and are unable to scan them in because they do not have a chart for the patient. They have never seen the patient and are going to have to disregard the x-ray until the patient makes an appointment. Results can be re-sent after the patient has an appointment.

## 2013-07-09 NOTE — Telephone Encounter (Signed)
She will call them back and make an apt.     KP

## 2013-07-13 ENCOUNTER — Other Ambulatory Visit (INDEPENDENT_AMBULATORY_CARE_PROVIDER_SITE_OTHER): Payer: Federal, State, Local not specified - PPO

## 2013-07-13 DIAGNOSIS — IMO0002 Reserved for concepts with insufficient information to code with codable children: Secondary | ICD-10-CM

## 2013-07-13 DIAGNOSIS — E1159 Type 2 diabetes mellitus with other circulatory complications: Secondary | ICD-10-CM

## 2013-07-13 DIAGNOSIS — Z0000 Encounter for general adult medical examination without abnormal findings: Secondary | ICD-10-CM

## 2013-07-13 LAB — BASIC METABOLIC PANEL
BUN: 11 mg/dL (ref 6–23)
Chloride: 102 mEq/L (ref 96–112)
Creatinine, Ser: 0.9 mg/dL (ref 0.4–1.2)
Glucose, Bld: 147 mg/dL — ABNORMAL HIGH (ref 70–99)
Potassium: 3.7 mEq/L (ref 3.5–5.1)

## 2013-07-13 LAB — CBC WITH DIFFERENTIAL/PLATELET
Basophils Relative: 0.5 % (ref 0.0–3.0)
Eosinophils Absolute: 0.1 10*3/uL (ref 0.0–0.7)
Eosinophils Relative: 2 % (ref 0.0–5.0)
Hemoglobin: 12.4 g/dL (ref 12.0–15.0)
Lymphocytes Relative: 31.8 % (ref 12.0–46.0)
MCHC: 32.6 g/dL (ref 30.0–36.0)
MCV: 78.8 fl (ref 78.0–100.0)
Neutro Abs: 3.6 10*3/uL (ref 1.4–7.7)
RBC: 4.81 Mil/uL (ref 3.87–5.11)
WBC: 6.2 10*3/uL (ref 4.5–10.5)

## 2013-07-13 LAB — HEPATIC FUNCTION PANEL
ALT: 19 U/L (ref 0–35)
Bilirubin, Direct: 0.1 mg/dL (ref 0.0–0.3)
Total Bilirubin: 0.6 mg/dL (ref 0.3–1.2)

## 2013-07-13 LAB — MICROALBUMIN / CREATININE URINE RATIO: Microalb, Ur: 1.2 mg/dL (ref 0.0–1.9)

## 2013-07-13 LAB — LIPID PANEL
HDL: 48.9 mg/dL (ref >39.00)
Total CHOL/HDL Ratio: 4
Triglycerides: 155 mg/dL — ABNORMAL HIGH (ref 0.0–149.0)
VLDL: 31 mg/dL (ref 0.0–40.0)

## 2013-07-13 LAB — HEMOGLOBIN A1C: Hgb A1c MFr Bld: 8.4 % — ABNORMAL HIGH (ref 4.6–6.5)

## 2013-07-15 LAB — POCT URINALYSIS DIPSTICK
Bilirubin, UA: NEGATIVE
Glucose, UA: NEGATIVE
Ketones, UA: NEGATIVE
Leukocytes, UA: NEGATIVE
Protein, UA: NEGATIVE
Spec Grav, UA: >=1.03

## 2013-07-20 ENCOUNTER — Other Ambulatory Visit: Payer: Self-pay

## 2013-07-20 DIAGNOSIS — E119 Type 2 diabetes mellitus without complications: Secondary | ICD-10-CM

## 2013-07-20 MED ORDER — METFORMIN HCL ER 500 MG PO TB24: 1000.0000 mg | ORAL_TABLET | Freq: Every day | ORAL | Status: DC

## 2013-07-20 MED ORDER — ROSUVASTATIN CALCIUM 20 MG PO TABS: ORAL_TABLET | ORAL | Status: DC

## 2013-08-03 ENCOUNTER — Encounter: Payer: Self-pay | Admitting: Gastroenterology

## 2013-09-25 ENCOUNTER — Encounter: Payer: Self-pay | Admitting: General Practice

## 2013-09-25 ENCOUNTER — Other Ambulatory Visit: Payer: Self-pay | Admitting: General Practice

## 2013-09-25 DIAGNOSIS — I10 Essential (primary) hypertension: Secondary | ICD-10-CM

## 2013-09-25 MED ORDER — LISINOPRIL-HYDROCHLOROTHIAZIDE 20-12.5 MG PO TABS: 2.0000 | ORAL_TABLET | Freq: Every day | ORAL | Status: DC

## 2013-09-29 ENCOUNTER — Encounter: Payer: Federal, State, Local not specified - PPO | Admitting: Gastroenterology

## 2013-09-30 ENCOUNTER — Other Ambulatory Visit: Payer: Self-pay

## 2013-09-30 NOTE — Telephone Encounter (Signed)
Rf refill for Lisinopril, Med's were sent yesterday      KP

## 2013-11-05 ENCOUNTER — Other Ambulatory Visit: Payer: Self-pay

## 2014-02-01 ENCOUNTER — Ambulatory Visit: Payer: Federal, State, Local not specified - PPO | Admitting: Family Medicine

## 2014-02-11 ENCOUNTER — Encounter: Payer: Self-pay | Admitting: Family Medicine

## 2014-03-19 ENCOUNTER — Other Ambulatory Visit: Payer: Self-pay

## 2014-03-19 DIAGNOSIS — E119 Type 2 diabetes mellitus without complications: Secondary | ICD-10-CM

## 2014-03-19 MED ORDER — METFORMIN HCL ER 500 MG PO TB24: 1000.0000 mg | ORAL_TABLET | Freq: Every day | ORAL | Status: DC

## 2014-06-15 ENCOUNTER — Other Ambulatory Visit: Payer: Self-pay | Admitting: Family Medicine

## 2014-06-18 ENCOUNTER — Telehealth: Payer: Self-pay

## 2014-06-18 NOTE — Telephone Encounter (Signed)
Pt called back. Please return call.  

## 2014-06-18 NOTE — Telephone Encounter (Signed)
Pt states that she has not made an appointment yet, but plans to do so.  Also states that she is feeling much better and does not have any other needs at this time.

## 2014-06-18 NOTE — Telephone Encounter (Signed)
MSG from patient stating she had pelvic pain yesterday and she wanted a return call. I called the patient and se stated she had pelvic pressure that extended to the rectum that was so bad she could not sit. Per patient the pain felt like contractions and it lasted for two hours. She said at the time she did nit have an appetite because she felt so bad. Patient said she tried Aleve and rested and the pain eventually wen away, she said she feels much better today but wanted to let Dr.Lowne know what was going on. I advised the patient to call er GYN and make them aware as well and she has agreed.      KP

## 2014-06-18 NOTE — Telephone Encounter (Signed)
Left a message for call back.  

## 2014-06-18 NOTE — Telephone Encounter (Signed)
Please check to make sure gyn was able to see pt

## 2014-07-15 ENCOUNTER — Telehealth: Payer: Self-pay

## 2014-07-15 NOTE — Telephone Encounter (Signed)
Diabetic bundle:  Mychart message sent

## 2014-07-29 ENCOUNTER — Other Ambulatory Visit: Payer: Self-pay

## 2014-07-29 DIAGNOSIS — Z1231 Encounter for screening mammogram for malignant neoplasm of breast: Secondary | ICD-10-CM

## 2014-08-02 ENCOUNTER — Ambulatory Visit
Admission: RE | Admit: 2014-08-02 | Discharge: 2014-08-02 | Disposition: A | Discharge status: Home / Self Care | Payer: Federal, State, Local not specified - PPO | Source: Ambulatory Visit

## 2014-08-02 DIAGNOSIS — Z1231 Encounter for screening mammogram for malignant neoplasm of breast: Secondary | ICD-10-CM

## 2014-10-15 ENCOUNTER — Other Ambulatory Visit: Payer: Self-pay

## 2015-01-05 LAB — HM DIABETES EYE EXAM

## 2015-01-13 ENCOUNTER — Encounter: Payer: Self-pay | Admitting: *Deleted

## 2015-01-18 LAB — HM PAP SMEAR

## 2015-06-27 ENCOUNTER — Other Ambulatory Visit: Payer: Self-pay

## 2015-07-05 ENCOUNTER — Other Ambulatory Visit: Payer: Self-pay

## 2015-07-05 DIAGNOSIS — Z1231 Encounter for screening mammogram for malignant neoplasm of breast: Secondary | ICD-10-CM

## 2015-08-04 ENCOUNTER — Ambulatory Visit
Admission: RE | Admit: 2015-08-04 | Discharge: 2015-08-04 | Disposition: A | Discharge status: Home / Self Care | Payer: Federal, State, Local not specified - PPO | Source: Ambulatory Visit

## 2015-08-04 DIAGNOSIS — Z1231 Encounter for screening mammogram for malignant neoplasm of breast: Secondary | ICD-10-CM

## 2015-08-08 ENCOUNTER — Other Ambulatory Visit: Payer: Self-pay | Admitting: Obstetrics and Gynecology

## 2015-08-08 DIAGNOSIS — R928 Other abnormal and inconclusive findings on diagnostic imaging of breast: Secondary | ICD-10-CM

## 2015-08-09 ENCOUNTER — Ambulatory Visit
Admission: RE | Admit: 2015-08-09 | Discharge: 2015-08-09 | Disposition: A | Discharge status: Home / Self Care | Payer: Federal, State, Local not specified - PPO | Source: Ambulatory Visit | Attending: Obstetrics and Gynecology | Admitting: Obstetrics and Gynecology

## 2015-08-09 DIAGNOSIS — R928 Other abnormal and inconclusive findings on diagnostic imaging of breast: Secondary | ICD-10-CM

## 2015-08-11 ENCOUNTER — Other Ambulatory Visit: Payer: Federal, State, Local not specified - PPO

## 2015-11-21 ENCOUNTER — Telehealth: Payer: Self-pay

## 2015-11-21 ENCOUNTER — Telehealth: Payer: Self-pay | Admitting: Family Medicine

## 2015-11-21 NOTE — Telephone Encounter (Signed)
Patient stated she needed to re-start her med's and wanted to come in. Apt scheduled.     KP

## 2015-11-21 NOTE — Telephone Encounter (Signed)
-----   Message from Audelia Acton sent at 11/21/2015 11:54 AM EST ----- Regarding: APPT Contact: [phone] Patient states she has not seen Dr. Laury Axon in over 2 years but wants to come back. Needed 30 minute appt but none avail in the time frame she wants. Requesting to be worked in once Dr. Laury Axon comes back because she is having personal issues that she does not want to discuss with a CMA. If she can't be worked in she will go somewhere else.

## 2015-11-21 NOTE — Telephone Encounter (Signed)
error:[id number] ° °

## 2015-12-13 ENCOUNTER — Encounter: Payer: Self-pay | Admitting: Family Medicine

## 2015-12-13 ENCOUNTER — Ambulatory Visit (INDEPENDENT_AMBULATORY_CARE_PROVIDER_SITE_OTHER): Payer: Federal, State, Local not specified - PPO | Admitting: Family Medicine

## 2015-12-13 VITALS — BP 138/90 | HR 92 | Temp 98.0°F | Ht 64.0 in | Wt 150.0 lb

## 2015-12-13 DIAGNOSIS — E119 Type 2 diabetes mellitus without complications: Secondary | ICD-10-CM

## 2015-12-13 DIAGNOSIS — E1165 Type 2 diabetes mellitus with hyperglycemia: Secondary | ICD-10-CM

## 2015-12-13 DIAGNOSIS — R8290 Unspecified abnormal findings in urine: Secondary | ICD-10-CM

## 2015-12-13 DIAGNOSIS — IMO0001 Reserved for inherently not codable concepts without codable children: Secondary | ICD-10-CM

## 2015-12-13 DIAGNOSIS — E785 Hyperlipidemia, unspecified: Secondary | ICD-10-CM

## 2015-12-13 DIAGNOSIS — I10 Essential (primary) hypertension: Secondary | ICD-10-CM

## 2015-12-13 LAB — COMPREHENSIVE METABOLIC PANEL
ALT: 15 U/L (ref 0–35)
AST: 17 U/L (ref 0–37)
Albumin: 4.3 g/dL (ref 3.5–5.2)
Alkaline Phosphatase: 96 U/L (ref 39–117)
BUN: 11 mg/dL (ref 6–23)
CALCIUM: 9.3 mg/dL (ref 8.4–10.5)
CHLORIDE: 98 meq/L (ref 96–112)
CO2: 28 meq/L (ref 19–32)
Creatinine, Ser: 0.91 mg/dL (ref 0.40–1.20)
GFR: 84.22 mL/min (ref >60.00)
Glucose, Bld: 246 mg/dL — ABNORMAL HIGH (ref 70–99)
POTASSIUM: 3.3 meq/L — AB (ref 3.5–5.1)
Sodium: 134 mEq/L — ABNORMAL LOW (ref 135–145)
Total Bilirubin: 0.4 mg/dL (ref 0.2–1.2)
Total Protein: 7.4 g/dL (ref 6.0–8.3)

## 2015-12-13 LAB — POCT URINALYSIS DIPSTICK
Bilirubin, UA: NEGATIVE
Glucose, UA: POSITIVE
KETONES UA: NEGATIVE
LEUKOCYTES UA: NEGATIVE
Nitrite, UA: NEGATIVE
PH UA: 6
SPEC GRAV UA: 1.02
Urobilinogen, UA: 0.2

## 2015-12-13 LAB — CBC WITH DIFFERENTIAL/PLATELET
BASOS ABS: 0 10*3/uL (ref 0.0–0.1)
Basophils Relative: 0.6 % (ref 0.0–3.0)
EOS ABS: 0.1 10*3/uL (ref 0.0–0.7)
Eosinophils Relative: 1.8 % (ref 0.0–5.0)
HCT: 42.3 % (ref 36.0–46.0)
Hemoglobin: 13.7 g/dL (ref 12.0–15.0)
LYMPHS ABS: 2 10*3/uL (ref 0.7–4.0)
Lymphocytes Relative: 35.8 % (ref 12.0–46.0)
MCHC: 32.4 g/dL (ref 30.0–36.0)
MCV: 77.1 fl — ABNORMAL LOW (ref 78.0–100.0)
MONO ABS: 0.4 10*3/uL (ref 0.1–1.0)
Monocytes Relative: 7.8 % (ref 3.0–12.0)
NEUTROS PCT: 54 % (ref 43.0–77.0)
Neutro Abs: 3 10*3/uL (ref 1.4–7.7)
Platelets: 291 10*3/uL (ref 150.0–400.0)
RBC: 5.49 Mil/uL — AB (ref 3.87–5.11)
RDW: 14.5 % (ref 11.5–15.5)
WBC: 5.5 10*3/uL (ref 4.0–10.5)

## 2015-12-13 LAB — LIPID PANEL
Cholesterol: 295 mg/dL — ABNORMAL HIGH (ref 0–200)
HDL: 50.5 mg/dL (ref >39.00)
LDL Cholesterol: 215 mg/dL — ABNORMAL HIGH (ref 0–99)
NONHDL: 244.23
Total CHOL/HDL Ratio: 6
Triglycerides: 147 mg/dL (ref 0.0–149.0)
VLDL: 29.4 mg/dL (ref 0.0–40.0)

## 2015-12-13 LAB — MICROALBUMIN / CREATININE URINE RATIO
Creatinine,U: 223.6 mg/dL
MICROALB/CREAT RATIO: 0.7 mg/g (ref 0.0–30.0)
Microalb, Ur: 1.6 mg/dL (ref 0.0–1.9)

## 2015-12-13 LAB — TSH: TSH: 1.74 u[IU]/mL (ref 0.35–4.50)

## 2015-12-13 LAB — HEMOGLOBIN A1C: HEMOGLOBIN A1C: 12.8 % — AB (ref 4.6–6.5)

## 2015-12-13 MED ORDER — LISINOPRIL-HYDROCHLOROTHIAZIDE 20-12.5 MG PO TABS: ORAL_TABLET | ORAL | Status: DC

## 2015-12-13 MED ORDER — GLUCOSE BLOOD VI STRP: ORAL_STRIP | Status: AC

## 2015-12-13 NOTE — Assessment & Plan Note (Signed)
Check labs today and then decide which meds to re start New glucometer given to pt Check glucose at least qd

## 2015-12-13 NOTE — Progress Notes (Signed)
Patient ID: Paige Fleming, female    DOB: 09-Jan-1966  Age: 49 y.o. MRN: [id number]    Subjective:  Subjective HPI Paige Fleming presents for f/u dm, cholesterol and htn.  She has not been seen for 2 years.  She stopped all her meds--- lost 20 lbs.  She is not checking her blood sugar.    Review of Systems  Constitutional: Negative for diaphoresis, appetite change, fatigue and unexpected weight change.  Eyes: Negative for pain, redness and visual disturbance.  Respiratory: Negative for cough, chest tightness, shortness of breath and wheezing.   Cardiovascular: Negative for chest pain, palpitations and leg swelling.  Endocrine: Negative for cold intolerance, heat intolerance, polydipsia, polyphagia and polyuria.  Genitourinary: Negative for dysuria, frequency and difficulty urinating.  Neurological: Negative for dizziness, light-headedness, numbness and headaches.    History Past Medical History  Diagnosis Date  . Gestational diabetes mellitus 2001  . Allergic rhinitis   . Asthma   . Hypertension   . Hyperlipidemia   . Diabetes mellitus type II   . Abortion history 1995    G3P2012    She has past surgical history that includes Tubal ligation (2001) and Cervical biopsy (2000).   Her family history includes Allergies in an other family member; Arthritis in her maternal grandmother and mother; Asthma in an other family member; Breast cancer in an other family member; Breast cancer (age of onset: 51) in her mother; Cancer in her mother; Crohn's disease in her sister; Diabetes in her father, mother, and paternal aunt; Heart disease (age of onset: 16) in her maternal grandfather; Hyperlipidemia in her father and mother; Hypertension in her maternal aunt, maternal grandmother, and mother.She reports that she has never smoked. She does not have any smokeless tobacco history on file. She reports that she does not drink alcohol or use illicit drugs.  Current Outpatient  Prescriptions on File Prior to Visit  Medication Sig Dispense Refill  . Azelastine HCl (ASTEPRO) 0.15 % SOLN Place 2 sprays into both nostrils daily. 30 mL 11  . B Complex Vitamins LIQD 1 gtt qid (Patient not taking: Reported on 12/13/2015) 3 Bottle 3  . fenofibrate micronized (ANTARA) 130 MG capsule Take 1 capsule (130 mg total) by mouth daily before breakfast. (Patient not taking: Reported on 12/13/2015) 30 capsule 2  . Insulin Pen Needle 32G X 6 MM MISC As directed (Patient not taking: Reported on 12/13/2015) 100 each 11  . levocetirizine (XYZAL) 5 MG tablet Take 5 mg by mouth every evening.    . Liraglutide 18 MG/3ML SOLN 1.8 mg  SQ daily    . metFORMIN (GLUCOPHAGE XR) 500 MG 24 hr tablet Take 2 tablets (1,000 mg total) by mouth daily with breakfast. Repeat labs are due now (Patient not taking: Reported on 12/13/2015) 180 tablet 0  . montelukast (SINGULAIR) 10 MG tablet Take 1 tablet (10 mg total) by mouth daily as needed. (Patient not taking: Reported on 12/13/2015) 90 tablet 3  . Olopatadine HCl (PATANASE) 0.6 % SOLN Place 2 puffs into the nose 2 (two) times daily.    Jonetta Speak LANCETS MISC Check BS daily (Patient not taking: Reported on 12/13/2015) 100 each 1  . rosuvastatin (CRESTOR) 20 MG tablet TAKE 1.5 TABLET BY MOUTH AT BEDTIME (Patient not taking: Reported on 12/13/2015) 45 tablet 2  . [DISCONTINUED] lisinopril (PRINIVIL,ZESTRIL) 20 MG tablet Take 1 tablet (20 mg total) by mouth daily. 30 tablet 2  . [DISCONTINUED] Saxagliptin-Metformin 04-999 MG TB24 Take 1 tablet by mouth every  evening. 30 tablet 2   No current facility-administered medications on file prior to visit.     Objective:  Objective Physical Exam  Constitutional: She is oriented to person, place, and time. She appears well-developed and well-nourished.  HENT:  Head: Normocephalic and atraumatic.  Eyes: Conjunctivae and EOM are normal.  Neck: Normal range of motion. Neck supple. No JVD present. Carotid bruit  is not present. No thyromegaly present.  Cardiovascular: Normal rate, regular rhythm and normal heart sounds.   No murmur heard. Pulmonary/Chest: Effort normal and breath sounds normal. No respiratory distress. She has no wheezes. She has no rales. She exhibits no tenderness.  Musculoskeletal: She exhibits no edema.  Neurological: She is alert and oriented to person, place, and time.  Psychiatric: She has a normal mood and affect.  Nursing note and vitals reviewed. Sensory exam of the foot is normal, tested with the monofilament. Good pulses, no lesions or ulcers, good peripheral pulses. BP 138/90 mmHg  Pulse 92  Temp(Src) 98 F (36.7 C) (Oral)  Ht '5\' 4"'  (1.626 m)  Wt 150 lb (68.04 kg)  BMI 25.73 kg/m2  SpO2 99% Wt Readings from Last 3 Encounters:  12/13/15 150 lb (68.04 kg)  07/06/13 169 lb 3.2 oz (76.749 kg)  11/24/12 157 lb 6.4 oz (71.396 kg)     Lab Results  Component Value Date   WBC 6.2 07/13/2013   HGB 12.4 07/13/2013   HCT 37.9 07/13/2013   PLT 212.0 07/13/2013   GLUCOSE 147* 07/13/2013   CHOL 213* 07/13/2013   TRIG 155.0* 07/13/2013   HDL 48.90 07/13/2013   LDLDIRECT 152.3 07/13/2013   LDLCALC 93 03/06/2012   ALT 19 07/13/2013   AST 18 07/13/2013   NA 135 07/13/2013   K 3.7 07/13/2013   CL 102 07/13/2013   CREATININE 0.9 07/13/2013   BUN 11 07/13/2013   CO2 23 07/13/2013   TSH 4.27 07/13/2013   HGBA1C 8.4* 07/13/2013   MICROALBUR 1.2 07/13/2013    Mm Diag Breast Tomo Uni Left  08/09/2015  CLINICAL DATA:  49 year old female presenting for evaluation of a possible mass in the left breast on screening mammography. EXAM: DIGITAL DIAGNOSTIC BILATERAL MAMMOGRAM WITH 3D TOMOSYNTHESIS AND CAD COMPARISON:  Previous exam(s). ACR Breast Density Category b: There are scattered areas of fibroglandular density. FINDINGS: Tomosynthesis views were obtained of the possible mass in the left breast which demonstrates a 9 mm asymmetry, which on additional views appear stable  from the 2013 exam. There is no mammographic evidence of malignancy in the left breast. Mammographic images were processed with CAD. IMPRESSION: Stable 9 mm left breast asymmetry. No mammographic evidence of malignancy. RECOMMENDATION: Screening mammogram in one year.(Code:SM-B-01Y) I have discussed the findings and recommendations with the patient. Results were also provided in writing at the conclusion of the visit. If applicable, a reminder letter will be sent to the patient regarding the next appointment. BI-RADS CATEGORY  2: Benign. Electronically Signed   By: Ammie Ferrier M.D.   On: 08/09/2015 15:51     Assessment & Plan:  Plan I have discontinued Paige Fleming's glucose blood and cephALEXin. I have also changed her lisinopril-hydrochlorothiazide. Additionally, I am having her start on glucose blood. Lastly, I am having her maintain her ONETOUCH DELICA LANCETS, montelukast, Azelastine HCl, B Complex Vitamins, Insulin Pen Needle, Liraglutide, fenofibrate micronized, levocetirizine, Olopatadine HCl, rosuvastatin, and metFORMIN.  Meds ordered this encounter  Medications  . lisinopril-hydrochlorothiazide (PRINZIDE,ZESTORETIC) 20-12.5 MG tablet    Sig: Take 2 tablets by mouth  daily--Office visit due now 30 days only    Dispense:  60 tablet    Refill:  0  . glucose blood test strip    Sig: Use as instructed    Dispense:  100 each    Refill:  12    One touch verio flex    Problem List Items Addressed This Visit    Essential hypertension    Elevated-- pt only taking 1 lisinopril hct--- instructed pt to take 2 again rto 3 months or sooner prn      Relevant Medications   lisinopril-hydrochlorothiazide (PRINZIDE,ZESTORETIC) 20-12.5 MG tablet   Other Relevant Orders   CBC with Differential/Platelet   Hemoglobin A1c   Lipid panel   Microalbumin / creatinine urine ratio   POCT urinalysis dipstick (Completed)   TSH   Comp Met (CMET)   Diabetes mellitus type II, uncontrolled  ([place])    Check labs today and then decide which meds to re start New glucometer given to pt Check glucose at least qd      Relevant Medications   lisinopril-hydrochlorothiazide (PRINZIDE,ZESTORETIC) 20-12.5 MG tablet    Other Visit Diagnoses    Hyperlipidemia LDL goal <70    -  Primary    Relevant Medications    lisinopril-hydrochlorothiazide (PRINZIDE,ZESTORETIC) 20-12.5 MG tablet    Other Relevant Orders    CBC with Differential/Platelet    Hemoglobin A1c    Lipid panel    Microalbumin / creatinine urine ratio    POCT urinalysis dipstick (Completed)    TSH    Comp Met (CMET)    Type II diabetes mellitus with HbA1C goal between 7 and 8        Relevant Medications    lisinopril-hydrochlorothiazide (PRINZIDE,ZESTORETIC) 20-12.5 MG tablet    glucose blood test strip    Other Relevant Orders    CBC with Differential/Platelet    Hemoglobin A1c    Lipid panel    Microalbumin / creatinine urine ratio    POCT urinalysis dipstick (Completed)    TSH    Comp Met (CMET)    Abnormal urine        Relevant Orders    Urine Culture       Follow-up: Return in about 3 months (around 03/12/2016), or if symptoms worsen or fail to improve, for hypertension, hyperlipidemia, diabetes II.  Garnet Koyanagi, DO

## 2015-12-13 NOTE — Assessment & Plan Note (Signed)
Elevated-- pt only taking 1 lisinopril hct--- instructed pt to take 2 again rto 3 months or sooner prn

## 2015-12-13 NOTE — Patient Instructions (Signed)

## 2015-12-13 NOTE — Progress Notes (Signed)
Pre visit review using our clinic review tool, if applicable. No additional management support is needed unless otherwise documented below in the visit note. 

## 2015-12-15 LAB — URINE CULTURE

## 2016-02-07 ENCOUNTER — Telehealth: Payer: Self-pay | Admitting: Family Medicine

## 2016-02-07 DIAGNOSIS — I10 Essential (primary) hypertension: Secondary | ICD-10-CM

## 2016-02-07 DIAGNOSIS — E119 Type 2 diabetes mellitus without complications: Secondary | ICD-10-CM

## 2016-02-07 NOTE — Telephone Encounter (Signed)
Caller name: Self  Can be reached: [phone]  Reason for call: Patient request call back about lab results

## 2016-02-08 MED ORDER — METFORMIN HCL ER 500 MG PO TB24: 1000.0000 mg | ORAL_TABLET | Freq: Every day | ORAL | Status: DC

## 2016-02-08 MED ORDER — ROSUVASTATIN CALCIUM 20 MG PO TABS: ORAL_TABLET | ORAL | Status: DC

## 2016-02-08 MED ORDER — LIRAGLUTIDE 18 MG/3ML ~~LOC~~ SOPN: 1.8000 mg | PEN_INJECTOR | Freq: Every day | SUBCUTANEOUS | Status: DC

## 2016-02-08 MED ORDER — LISINOPRIL-HYDROCHLOROTHIAZIDE 20-12.5 MG PO TABS: ORAL_TABLET | ORAL | Status: DC

## 2016-02-08 NOTE — Telephone Encounter (Signed)
Spoke with patient and she stated she got her labs in the mail back in December but Dr.Lowne never sent her med's to the pharmacy. She stated she needs her Crestor, Victoza, Metformin and Lisinopril-Hctz. All med's sent and she is scheduled to follow up at the end of March.      KP

## 2016-03-13 ENCOUNTER — Other Ambulatory Visit: Payer: Self-pay

## 2016-03-13 MED ORDER — INSULIN PEN NEEDLE 31G X 5 MM MISC: Status: DC

## 2016-05-01 ENCOUNTER — Other Ambulatory Visit: Payer: Self-pay

## 2016-05-01 DIAGNOSIS — Z1231 Encounter for screening mammogram for malignant neoplasm of breast: Secondary | ICD-10-CM

## 2016-06-14 DIAGNOSIS — K080 Exfoliation of teeth due to systemic causes: Secondary | ICD-10-CM | POA: Diagnosis not present

## 2016-07-12 ENCOUNTER — Emergency Department (HOSPITAL_BASED_OUTPATIENT_CLINIC_OR_DEPARTMENT_OTHER)
Admission: EM | Admit: 2016-07-12 | Discharge: 2016-07-12 | Disposition: A | Discharge status: Home / Self Care | Payer: Federal, State, Local not specified - PPO | Attending: Physician Assistant | Admitting: Physician Assistant

## 2016-07-12 ENCOUNTER — Encounter (HOSPITAL_BASED_OUTPATIENT_CLINIC_OR_DEPARTMENT_OTHER): Payer: Self-pay | Admitting: *Deleted

## 2016-07-12 DIAGNOSIS — E119 Type 2 diabetes mellitus without complications: Secondary | ICD-10-CM | POA: Diagnosis not present

## 2016-07-12 DIAGNOSIS — Z7984 Long term (current) use of oral hypoglycemic drugs: Secondary | ICD-10-CM | POA: Diagnosis not present

## 2016-07-12 DIAGNOSIS — Z79899 Other long term (current) drug therapy: Secondary | ICD-10-CM | POA: Diagnosis not present

## 2016-07-12 DIAGNOSIS — G44319 Acute post-traumatic headache, not intractable: Secondary | ICD-10-CM | POA: Diagnosis not present

## 2016-07-12 DIAGNOSIS — I10 Essential (primary) hypertension: Secondary | ICD-10-CM | POA: Insufficient documentation

## 2016-07-12 DIAGNOSIS — Z794 Long term (current) use of insulin: Secondary | ICD-10-CM | POA: Diagnosis not present

## 2016-07-12 DIAGNOSIS — J45909 Unspecified asthma, uncomplicated: Secondary | ICD-10-CM | POA: Diagnosis not present

## 2016-07-12 MED ORDER — METHOCARBAMOL 500 MG PO TABS: 500.0000 mg | ORAL_TABLET | Freq: Two times a day (BID) | ORAL | Status: DC

## 2016-07-12 MED ORDER — NAPROXEN 500 MG PO TABS: 500.0000 mg | ORAL_TABLET | Freq: Two times a day (BID) | ORAL | Status: DC

## 2016-07-12 MED FILL — METHOCARBAMOL 500 MG TABLET: 500 | 10 days supply | Qty: 20 | Fill #0

## 2016-07-12 MED FILL — NAPROXEN 500 MG TABLET: 500 | 15 days supply | Qty: 30 | Fill #0
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
## 2016-08-07 ENCOUNTER — Ambulatory Visit
Admission: RE | Admit: 2016-08-07 | Discharge: 2016-08-07 | Disposition: A | Discharge status: Home / Self Care | Payer: Federal, State, Local not specified - PPO | Source: Ambulatory Visit

## 2016-08-07 DIAGNOSIS — Z1231 Encounter for screening mammogram for malignant neoplasm of breast: Secondary | ICD-10-CM | POA: Diagnosis not present

## 2016-08-22 DIAGNOSIS — B3789 Other sites of candidiasis: Secondary | ICD-10-CM | POA: Diagnosis not present

## 2016-08-22 DIAGNOSIS — Z119 Encounter for screening for infectious and parasitic diseases, unspecified: Secondary | ICD-10-CM | POA: Diagnosis not present

## 2016-08-22 DIAGNOSIS — E119 Type 2 diabetes mellitus without complications: Secondary | ICD-10-CM | POA: Diagnosis not present

## 2016-09-03 LAB — HM DIABETES EYE EXAM

## 2016-09-05 DIAGNOSIS — I10 Essential (primary) hypertension: Secondary | ICD-10-CM | POA: Diagnosis not present

## 2016-09-05 DIAGNOSIS — E7800 Pure hypercholesterolemia, unspecified: Secondary | ICD-10-CM | POA: Diagnosis not present

## 2016-09-05 DIAGNOSIS — E1165 Type 2 diabetes mellitus with hyperglycemia: Secondary | ICD-10-CM | POA: Diagnosis not present

## 2016-09-26 DIAGNOSIS — E7800 Pure hypercholesterolemia, unspecified: Secondary | ICD-10-CM | POA: Diagnosis not present

## 2016-09-26 DIAGNOSIS — E1165 Type 2 diabetes mellitus with hyperglycemia: Secondary | ICD-10-CM | POA: Diagnosis not present

## 2016-09-26 DIAGNOSIS — I10 Essential (primary) hypertension: Secondary | ICD-10-CM | POA: Diagnosis not present

## 2016-10-01 DIAGNOSIS — E1165 Type 2 diabetes mellitus with hyperglycemia: Secondary | ICD-10-CM | POA: Diagnosis not present

## 2016-12-07 ENCOUNTER — Other Ambulatory Visit: Payer: Self-pay | Admitting: Family Medicine

## 2016-12-07 DIAGNOSIS — I10 Essential (primary) hypertension: Secondary | ICD-10-CM

## 2016-12-07 NOTE — Telephone Encounter (Signed)
Sent Rx request (Lisinopril-HCTZ 20-12.5mg  2 tablets by mouth daily).  Last Ov: 12/13/2015 Last Rf: 02/08/2016 Missed Last Scheduled Appointment, 03/12/2016 with Dr.Lowne-Chase. Please schedule an appointment to be seen by Provider to continue care.

## 2016-12-25 NOTE — Telephone Encounter (Signed)
Patient scheduled for 01/28/2017

## 2017-01-28 ENCOUNTER — Ambulatory Visit: Payer: Federal, State, Local not specified - PPO | Admitting: Family Medicine

## 2017-01-28 DIAGNOSIS — E1165 Type 2 diabetes mellitus with hyperglycemia: Secondary | ICD-10-CM | POA: Diagnosis not present

## 2017-01-28 DIAGNOSIS — I10 Essential (primary) hypertension: Secondary | ICD-10-CM | POA: Diagnosis not present

## 2017-01-28 DIAGNOSIS — E7800 Pure hypercholesterolemia, unspecified: Secondary | ICD-10-CM | POA: Diagnosis not present

## 2017-03-15 DIAGNOSIS — R21 Rash and other nonspecific skin eruption: Secondary | ICD-10-CM | POA: Diagnosis not present

## 2017-03-15 DIAGNOSIS — J3089 Other allergic rhinitis: Secondary | ICD-10-CM | POA: Diagnosis not present

## 2017-03-15 DIAGNOSIS — J301 Allergic rhinitis due to pollen: Secondary | ICD-10-CM | POA: Diagnosis not present

## 2017-03-15 DIAGNOSIS — J3081 Allergic rhinitis due to animal (cat) (dog) hair and dander: Secondary | ICD-10-CM | POA: Diagnosis not present

## 2017-03-26 DIAGNOSIS — Z01419 Encounter for gynecological examination (general) (routine) without abnormal findings: Secondary | ICD-10-CM | POA: Diagnosis not present

## 2017-03-26 DIAGNOSIS — Z1151 Encounter for screening for human papillomavirus (HPV): Secondary | ICD-10-CM | POA: Diagnosis not present

## 2017-03-26 DIAGNOSIS — Z6829 Body mass index (BMI) 29.0-29.9, adult: Secondary | ICD-10-CM | POA: Diagnosis not present

## 2017-05-02 ENCOUNTER — Ambulatory Visit (INDEPENDENT_AMBULATORY_CARE_PROVIDER_SITE_OTHER): Payer: Federal, State, Local not specified - PPO | Admitting: Family Medicine

## 2017-05-02 ENCOUNTER — Encounter: Payer: Self-pay | Admitting: Family Medicine

## 2017-05-02 VITALS — BP 140/84 | HR 86 | Temp 98.2°F | Resp 16 | Ht 64.0 in | Wt 171.6 lb

## 2017-05-02 DIAGNOSIS — IMO0002 Reserved for concepts with insufficient information to code with codable children: Secondary | ICD-10-CM

## 2017-05-02 DIAGNOSIS — E118 Type 2 diabetes mellitus with unspecified complications: Secondary | ICD-10-CM | POA: Diagnosis not present

## 2017-05-02 DIAGNOSIS — I10 Essential (primary) hypertension: Secondary | ICD-10-CM

## 2017-05-02 DIAGNOSIS — E1165 Type 2 diabetes mellitus with hyperglycemia: Secondary | ICD-10-CM | POA: Diagnosis not present

## 2017-05-02 DIAGNOSIS — E785 Hyperlipidemia, unspecified: Secondary | ICD-10-CM

## 2017-05-02 LAB — LIPID PANEL
CHOL/HDL RATIO: 5
Cholesterol: 259 mg/dL — ABNORMAL HIGH (ref 0–200)
HDL: 56 mg/dL (ref >39.00)
LDL Cholesterol: 181 mg/dL — ABNORMAL HIGH (ref 0–99)
NONHDL: 202.86
Triglycerides: 110 mg/dL (ref 0.0–149.0)
VLDL: 22 mg/dL (ref 0.0–40.0)

## 2017-05-02 LAB — COMPREHENSIVE METABOLIC PANEL
ALT: 14 U/L (ref 0–35)
AST: 14 U/L (ref 0–37)
Albumin: 4.1 g/dL (ref 3.5–5.2)
Alkaline Phosphatase: 78 U/L (ref 39–117)
BILIRUBIN TOTAL: 0.3 mg/dL (ref 0.2–1.2)
BUN: 15 mg/dL (ref 6–23)
CO2: 31 meq/L (ref 19–32)
CREATININE: 0.92 mg/dL (ref 0.40–1.20)
Calcium: 9.4 mg/dL (ref 8.4–10.5)
Chloride: 104 mEq/L (ref 96–112)
GFR: 82.7 mL/min (ref >60.00)
GLUCOSE: 126 mg/dL — AB (ref 70–99)
Potassium: 4 mEq/L (ref 3.5–5.1)
Sodium: 139 mEq/L (ref 135–145)
Total Protein: 7.1 g/dL (ref 6.0–8.3)

## 2017-05-02 MED ORDER — LISINOPRIL-HYDROCHLOROTHIAZIDE 20-12.5 MG PO TABS: 2.0000 | ORAL_TABLET | Freq: Every day | ORAL | 1 refills | Status: DC

## 2017-05-02 MED ORDER — ROSUVASTATIN CALCIUM 20 MG PO TABS: ORAL_TABLET | ORAL | 1 refills | Status: DC

## 2017-05-02 NOTE — Assessment & Plan Note (Signed)
Per endo  Last hgba1c >10 per pt

## 2017-05-02 NOTE — Progress Notes (Signed)
Patient ID: Paige Fleming, female   DOB: [birth year], 51 y.o.   MRN: [id number]    Subjective:    Patient ID: Paige Fleming, female    DOB: Aug 05, 1966, 51 y.o.   MRN: [id number]  Chief Complaint  Patient presents with  . Hypertension  . Hyperlipidemia  . Diabetes    HPI Patient is in today for follow up blood pressure and cholesterol.  She sees Dr. Talmage Nap for her diabetes now and has an appointment this month.  She has cut her lisinopril/hctz to 1 tab per day instead of 2 because she felt that it was too much.  Past Medical History:  Diagnosis Date  . Abortion history 1995   [id number]  . Allergic rhinitis   . Asthma   . Diabetes mellitus type II   . Gestational diabetes mellitus 2001  . Hyperlipidemia   . Hypertension     Past Surgical History:  Procedure Laterality Date  . CERVICAL BIOPSY  2000  . TUBAL LIGATION  2001    Family History  Problem Relation Age of Onset  . Hypertension Mother   . Arthritis Mother   . Breast cancer Mother 44  . Cancer Mother     breast  . Diabetes Mother   . Hyperlipidemia Mother   . Hypertension Maternal Grandmother   . Arthritis Maternal Grandmother   . Hypertension Maternal Aunt   . Crohn's disease Sister     twin  . Breast cancer      maternal cousin  . Diabetes Paternal Aunt   . Diabetes Father   . Hyperlipidemia Father   . Allergies      children  . Asthma      children  . Heart disease Maternal Grandfather 14    mi    Social History   Social History  . Marital status: Married    Spouse name: N/A  . Number of children: 2  . Years of education: N/A   Occupational History  . CLIENT SER REP [employer]  . personal processing specialist     [employer]   Social History Main Topics  . Smoking status: Never Smoker  . Smokeless tobacco: Not on file  . Alcohol use No     Comment: rare  . Drug use: No  . Sexual activity: Yes    Partners: Male   Other Topics Concern  . Not on file   Social History  Narrative   Exercise-- walks in am    Outpatient Medications Prior to Visit  Medication Sig Dispense Refill  . Azelastine HCl (ASTEPRO) 0.15 % SOLN Place 2 sprays into both nostrils daily. 30 mL 11  . glucose blood test strip Use as instructed 100 each 12  . Insulin Pen Needle (B-D UF III MINI PEN NEEDLES) 31G X 5 MM MISC Use daily with Voctoza 30 each 11  . levocetirizine (XYZAL) 5 MG tablet Take 5 mg by mouth every evening.    Letta Pate DELICA LANCETS MISC Check BS daily 100 each 1  . lisinopril-hydrochlorothiazide (PRINZIDE,ZESTORETIC) 20-12.5 MG tablet TAKE 2 TABLETS BY MOUTH DAILY 180 tablet 0  . rosuvastatin (CRESTOR) 20 MG tablet TAKE 1.5 TABLET BY MOUTH AT BEDTIME 135 tablet 1  . B Complex Vitamins LIQD 1 gtt qid 3 Bottle 3  . Liraglutide 18 MG/3ML SOPN Inject 0.3 mLs (1.8 mg total) into the skin daily. 27 mL 1  . metFORMIN (GLUCOPHAGE XR) 500 MG 24 hr tablet Take 2 tablets (1,000 mg total) by mouth daily with  breakfast. 180 tablet 1  . methocarbamol (ROBAXIN) 500 MG tablet Take 1 tablet (500 mg total) by mouth 2 (two) times daily. 20 tablet 0  . montelukast (SINGULAIR) 10 MG tablet Take 1 tablet (10 mg total) by mouth daily as needed. 90 tablet 3  . naproxen (NAPROSYN) 500 MG tablet Take 1 tablet (500 mg total) by mouth 2 (two) times daily. 30 tablet 0  . Olopatadine HCl (PATANASE) 0.6 % SOLN Place 2 puffs into the nose 2 (two) times daily.     No facility-administered medications prior to visit.     No Known Allergies  Review of Systems  Constitutional: Negative for fever and malaise/fatigue.  HENT: Negative for congestion.   Eyes: Negative for blurred vision.  Respiratory: Negative for cough and shortness of breath.   Cardiovascular: Negative for chest pain, palpitations and leg swelling.  Gastrointestinal: Negative for vomiting.  Musculoskeletal: Negative for back pain.  Skin: Negative for rash.  Neurological: Negative for loss of consciousness and headaches.         Objective:    Physical Exam  Constitutional: She is oriented to person, place, and time. She appears well-developed and well-nourished. No distress.  HENT:  Head: Normocephalic and atraumatic.  Eyes: Conjunctivae and EOM are normal.  Neck: Normal range of motion. Neck supple. No JVD present. Carotid bruit is not present. No thyromegaly present.  Cardiovascular: Normal rate, regular rhythm and normal heart sounds.   No murmur heard. Pulmonary/Chest: Effort normal and breath sounds normal. No respiratory distress. She has no wheezes. She has no rales. She exhibits no tenderness.  Abdominal: Soft. Bowel sounds are normal. There is no tenderness.  Musculoskeletal: Normal range of motion. She exhibits no edema or deformity.  Neurological: She is alert and oriented to person, place, and time.  Skin: Skin is warm and dry. She is not diaphoretic.  Psychiatric: She has a normal mood and affect. Her behavior is normal. Judgment and thought content normal.  Nursing note and vitals reviewed.   BP 140/84 (BP Location: Left Arm, Cuff Size: Normal)   Pulse 86   Temp 98.2 F (36.8 C) (Oral)   Resp 16   Ht 5\' 4"  (1.626 m)   Wt 171 lb 9.6 oz (77.8 kg)   LMP 02/12/2017   SpO2 99%   BMI 29.46 kg/m  Wt Readings from Last 3 Encounters:  05/02/17 171 lb 9.6 oz (77.8 kg)  07/12/16 155 lb (70.3 kg)  12/13/15 150 lb (68 kg)     Lab Results  Component Value Date   WBC 5.5 12/13/2015   HGB 13.7 12/13/2015   HCT 42.3 12/13/2015   PLT 291.0 12/13/2015   GLUCOSE 126 (H) 05/02/2017   CHOL 259 (H) 05/02/2017   TRIG 110.0 05/02/2017   HDL 56.00 05/02/2017   LDLDIRECT 152.3 07/13/2013   LDLCALC 181 (H) 05/02/2017   ALT 14 05/02/2017   AST 14 05/02/2017   NA 139 05/02/2017   K 4.0 05/02/2017   CL 104 05/02/2017   CREATININE 0.92 05/02/2017   BUN 15 05/02/2017   CO2 31 05/02/2017   TSH 1.74 12/13/2015   HGBA1C 12.8 (H) 12/13/2015   MICROALBUR 1.6 12/13/2015    Lab Results  Component Value  Date   TSH 1.74 12/13/2015   Lab Results  Component Value Date   WBC 5.5 12/13/2015   HGB 13.7 12/13/2015   HCT 42.3 12/13/2015   MCV 77.1 (L) 12/13/2015   PLT 291.0 12/13/2015   Lab Results  Component  Value Date   NA 139 05/02/2017   K 4.0 05/02/2017   CO2 31 05/02/2017   GLUCOSE 126 (H) 05/02/2017   BUN 15 05/02/2017   CREATININE 0.92 05/02/2017   BILITOT 0.3 05/02/2017   ALKPHOS 78 05/02/2017   AST 14 05/02/2017   ALT 14 05/02/2017   PROT 7.1 05/02/2017   ALBUMIN 4.1 05/02/2017   CALCIUM 9.4 05/02/2017   GFR 82.70 05/02/2017   Lab Results  Component Value Date   CHOL 259 (H) 05/02/2017   Lab Results  Component Value Date   HDL 56.00 05/02/2017   Lab Results  Component Value Date   LDLCALC 181 (H) 05/02/2017   Lab Results  Component Value Date   TRIG 110.0 05/02/2017   Lab Results  Component Value Date   CHOLHDL 5 05/02/2017   Lab Results  Component Value Date   HGBA1C 12.8 (H) 12/13/2015       Assessment & Plan:   Problem List Items Addressed This Visit      Unprioritized   Diabetes mellitus type II, uncontrolled (HCC)    Per endo  Last hgba1c >10 per pt      Relevant Medications   insulin aspart protamine- aspart (NOVOLOG MIX 70/30) (70-30) 100 UNIT/ML injection   lisinopril-hydrochlorothiazide (PRINZIDE,ZESTORETIC) 20-12.5 MG tablet   rosuvastatin (CRESTOR) 20 MG tablet   Essential hypertension    Poorly controlled will alter medications, encouraged DASH diet, minimize caffeine and obtain adequate sleep. Report concerning symptoms and follow up as directed and as needed      Relevant Medications   lisinopril-hydrochlorothiazide (PRINZIDE,ZESTORETIC) 20-12.5 MG tablet   rosuvastatin (CRESTOR) 20 MG tablet   Other Relevant Orders   Comprehensive metabolic panel (Completed)   Hyperlipidemia LDL goal <70 - Primary    Tolerating statin, encouraged heart healthy diet, avoid trans fats, minimize simple carbs and saturated fats. Increase  exercise as tolerated      Relevant Medications   lisinopril-hydrochlorothiazide (PRINZIDE,ZESTORETIC) 20-12.5 MG tablet   rosuvastatin (CRESTOR) 20 MG tablet   Other Relevant Orders   Comprehensive metabolic panel (Completed)   Lipid panel (Completed)      I have discontinued Ms. Alexander-Henderson's montelukast, B Complex Vitamins, Olopatadine HCl, liraglutide, metFORMIN, naproxen, and methocarbamol. I am also having her maintain her [facility] DELICA LANCETS, Azelastine HCl, levocetirizine, glucose blood, Insulin Pen Needle, B COMPLEX VITAMINS PO, insulin aspart protamine- aspart, lisinopril-hydrochlorothiazide, and rosuvastatin.  Meds ordered this encounter  Medications  . B COMPLEX VITAMINS PO    Sig: Take 1 drop by mouth daily. Liquid  . insulin aspart protamine- aspart (NOVOLOG MIX 70/30) (70-30) 100 UNIT/ML injection    Sig: Inject 30 Units into the skin 2 (two) times daily.  Marland Kitchen lisinopril-hydrochlorothiazide (PRINZIDE,ZESTORETIC) 20-12.5 MG tablet    Sig: Take 2 tablets by mouth daily.    Dispense:  180 tablet    Refill:  1  . rosuvastatin (CRESTOR) 20 MG tablet    Sig: TAKE 1.5 TABLET BY MOUTH AT BEDTIME    Dispense:  135 tablet    Refill:  1     Donato Schultz, DO

## 2017-05-02 NOTE — Progress Notes (Signed)
Pre visit review using our clinic review tool, if applicable. No additional management support is needed unless otherwise documented below in the visit note. 

## 2017-05-02 NOTE — Assessment & Plan Note (Signed)
Poorly controlled will alter medications, encouraged DASH diet, minimize caffeine and obtain adequate sleep. Report concerning symptoms and follow up as directed and as needed 

## 2017-05-02 NOTE — Patient Instructions (Signed)
Hypertension °Hypertension is another name for high blood pressure. High blood pressure forces your heart to work harder to pump blood. This can cause problems over time. °There are two numbers in a blood pressure reading. There is a top number (systolic) over a bottom number (diastolic). It is best to have a blood pressure below 120/80. Healthy choices can help lower your blood pressure. You may need medicine to help lower your blood pressure if: °· Your blood pressure cannot be lowered with healthy choices. °· Your blood pressure is higher than 130/80. °Follow these instructions at home: °Eating and drinking  °· If directed, follow the DASH eating plan. This diet includes: °¨ Filling half of your plate at each meal with fruits and vegetables. °¨ Filling one quarter of your plate at each meal with whole grains. Whole grains include whole wheat pasta, brown rice, and whole grain bread. °¨ Eating or drinking low-fat dairy products, such as skim milk or low-fat yogurt. °¨ Filling one quarter of your plate at each meal with low-fat (lean) proteins. Low-fat proteins include fish, skinless chicken, eggs, beans, and tofu. °¨ Avoiding fatty meat, cured and processed meat, or chicken with skin. °¨ Avoiding premade or processed food. °· Eat less than 1,500 mg of salt (sodium) a day. °· Limit alcohol use to no more than 1 drink a day for nonpregnant women and 2 drinks a day for men. One drink equals 12 oz of beer, 5 oz of wine, or 1½ oz of hard liquor. °Lifestyle  °· Work with your doctor to stay at a healthy weight or to lose weight. Ask your doctor what the best weight is for you. °· Get at least 30 minutes of exercise that causes your heart to beat faster (aerobic exercise) most days of the week. This may include walking, swimming, or biking. °· Get at least 30 minutes of exercise that strengthens your muscles (resistance exercise) at least 3 days a week. This may include lifting weights or pilates. °· Do not use any  products that contain nicotine or tobacco. This includes cigarettes and e-cigarettes. If you need help quitting, ask your doctor. °· Check your blood pressure at home as told by your doctor. °· Keep all follow-up visits as told by your doctor. This is important. °Medicines  °· Take over-the-counter and prescription medicines only as told by your doctor. Follow directions carefully. °· Do not skip doses of blood pressure medicine. The medicine does not work as well if you skip doses. Skipping doses also puts you at risk for problems. °· Ask your doctor about side effects or reactions to medicines that you should watch for. °Contact a doctor if: °· You think you are having a reaction to the medicine you are taking. °· You have headaches that keep coming back (recurring). °· You feel dizzy. °· You have swelling in your ankles. °· You have trouble with your vision. °Get help right away if: °· You get a very bad headache. °· You start to feel confused. °· You feel weak or numb. °· You feel faint. °· You get very bad pain in your: °¨ Chest. °¨ Belly (abdomen). °· You throw up (vomit) more than once. °· You have trouble breathing. °Summary °· Hypertension is another name for high blood pressure. °· Making healthy choices can help lower blood pressure. If your blood pressure cannot be controlled with healthy choices, you may need to take medicine. °This information is not intended to replace advice given to you by your   health care provider. Make sure you discuss any questions you have with your health care provider. °Document Released: 06/04/2008 Document Revised: 11/14/2016 Document Reviewed: 11/14/2016 °Elsevier Interactive Patient Education © 2017 [employer] Inc[employer] ° °

## 2017-05-02 NOTE — Assessment & Plan Note (Signed)
Tolerating statin, encouraged heart healthy diet, avoid trans fats, minimize simple carbs and saturated fats. Increase exercise as tolerated 

## 2017-05-06 ENCOUNTER — Other Ambulatory Visit: Payer: Self-pay | Admitting: Family Medicine

## 2017-05-06 DIAGNOSIS — E785 Hyperlipidemia, unspecified: Secondary | ICD-10-CM

## 2017-05-21 DIAGNOSIS — E7800 Pure hypercholesterolemia, unspecified: Secondary | ICD-10-CM | POA: Diagnosis not present

## 2017-05-21 DIAGNOSIS — E1165 Type 2 diabetes mellitus with hyperglycemia: Secondary | ICD-10-CM | POA: Diagnosis not present

## 2017-05-28 DIAGNOSIS — I10 Essential (primary) hypertension: Secondary | ICD-10-CM | POA: Diagnosis not present

## 2017-05-28 DIAGNOSIS — E7800 Pure hypercholesterolemia, unspecified: Secondary | ICD-10-CM | POA: Diagnosis not present

## 2017-05-28 DIAGNOSIS — E1165 Type 2 diabetes mellitus with hyperglycemia: Secondary | ICD-10-CM | POA: Diagnosis not present

## 2017-08-06 ENCOUNTER — Other Ambulatory Visit (INDEPENDENT_AMBULATORY_CARE_PROVIDER_SITE_OTHER): Payer: Federal, State, Local not specified - PPO

## 2017-08-06 DIAGNOSIS — E785 Hyperlipidemia, unspecified: Secondary | ICD-10-CM | POA: Diagnosis not present

## 2017-08-06 LAB — LIPID PANEL
CHOLESTEROL: 245 mg/dL — AB (ref 0–200)
HDL: 49.4 mg/dL (ref >39.00)
LDL Cholesterol: 156 mg/dL — ABNORMAL HIGH (ref 0–99)
NONHDL: 195.17
Total CHOL/HDL Ratio: 5
Triglycerides: 197 mg/dL — ABNORMAL HIGH (ref 0.0–149.0)
VLDL: 39.4 mg/dL (ref 0.0–40.0)

## 2017-08-06 LAB — COMPREHENSIVE METABOLIC PANEL
ALT: 12 U/L (ref 0–35)
AST: 13 U/L (ref 0–37)
Albumin: 4.1 g/dL (ref 3.5–5.2)
Alkaline Phosphatase: 79 U/L (ref 39–117)
BILIRUBIN TOTAL: 0.5 mg/dL (ref 0.2–1.2)
BUN: 13 mg/dL (ref 6–23)
CO2: 28 meq/L (ref 19–32)
CREATININE: 0.92 mg/dL (ref 0.40–1.20)
Calcium: 9.2 mg/dL (ref 8.4–10.5)
Chloride: 97 mEq/L (ref 96–112)
GFR: 82.61 mL/min (ref >60.00)
GLUCOSE: 231 mg/dL — AB (ref 70–99)
Potassium: 3.6 mEq/L (ref 3.5–5.1)
Sodium: 134 mEq/L — ABNORMAL LOW (ref 135–145)
TOTAL PROTEIN: 7.4 g/dL (ref 6.0–8.3)

## 2017-08-07 ENCOUNTER — Other Ambulatory Visit: Payer: Self-pay | Admitting: Family Medicine

## 2017-08-07 ENCOUNTER — Encounter: Payer: Self-pay | Admitting: Family Medicine

## 2017-08-07 DIAGNOSIS — Z1231 Encounter for screening mammogram for malignant neoplasm of breast: Secondary | ICD-10-CM

## 2017-08-07 MED ORDER — INSULIN PEN NEEDLE 31G X 5 MM MISC: 2 refills | Status: DC

## 2017-08-09 ENCOUNTER — Ambulatory Visit
Admission: RE | Admit: 2017-08-09 | Discharge: 2017-08-09 | Disposition: A | Discharge status: Home / Self Care | Payer: Federal, State, Local not specified - PPO | Source: Ambulatory Visit | Attending: Family Medicine | Admitting: Family Medicine

## 2017-08-09 DIAGNOSIS — Z1231 Encounter for screening mammogram for malignant neoplasm of breast: Secondary | ICD-10-CM

## 2017-11-04 ENCOUNTER — Encounter: Payer: Self-pay | Admitting: Family Medicine

## 2017-11-04 ENCOUNTER — Ambulatory Visit (INDEPENDENT_AMBULATORY_CARE_PROVIDER_SITE_OTHER): Payer: Federal, State, Local not specified - PPO | Admitting: Family Medicine

## 2017-11-04 VITALS — BP 128/78 | HR 70 | Temp 98.1°F | Ht 64.0 in | Wt 168.0 lb

## 2017-11-04 DIAGNOSIS — R739 Hyperglycemia, unspecified: Secondary | ICD-10-CM | POA: Diagnosis not present

## 2017-11-04 DIAGNOSIS — I10 Essential (primary) hypertension: Secondary | ICD-10-CM | POA: Diagnosis not present

## 2017-11-04 DIAGNOSIS — E785 Hyperlipidemia, unspecified: Secondary | ICD-10-CM | POA: Diagnosis not present

## 2017-11-04 LAB — COMPREHENSIVE METABOLIC PANEL
ALBUMIN: 3.9 g/dL (ref 3.5–5.2)
ALT: 12 U/L (ref 0–35)
AST: 13 U/L (ref 0–37)
Alkaline Phosphatase: 90 U/L (ref 39–117)
BUN: 6 mg/dL (ref 6–23)
CHLORIDE: 97 meq/L (ref 96–112)
CO2: 27 meq/L (ref 19–32)
Calcium: 9.4 mg/dL (ref 8.4–10.5)
Creatinine, Ser: 0.88 mg/dL (ref 0.40–1.20)
GFR: 86.88 mL/min (ref >60.00)
GLUCOSE: 238 mg/dL — AB (ref 70–99)
Potassium: 3.7 mEq/L (ref 3.5–5.1)
SODIUM: 134 meq/L — AB (ref 135–145)
TOTAL PROTEIN: 7.1 g/dL (ref 6.0–8.3)
Total Bilirubin: 0.4 mg/dL (ref 0.2–1.2)

## 2017-11-04 LAB — CBC WITH DIFFERENTIAL/PLATELET
Basophils Absolute: 0 10*3/uL (ref 0.0–0.1)
Basophils Relative: 0.6 % (ref 0.0–3.0)
EOS ABS: 0.1 10*3/uL (ref 0.0–0.7)
EOS PCT: 1.5 % (ref 0.0–5.0)
HEMATOCRIT: 40.9 % (ref 36.0–46.0)
HEMOGLOBIN: 13.2 g/dL (ref 12.0–15.0)
LYMPHS PCT: 30.6 % (ref 12.0–46.0)
Lymphs Abs: 1.7 10*3/uL (ref 0.7–4.0)
MCHC: 32.3 g/dL (ref 30.0–36.0)
MCV: 80 fl (ref 78.0–100.0)
MONO ABS: 0.5 10*3/uL (ref 0.1–1.0)
Monocytes Relative: 8.8 % (ref 3.0–12.0)
Neutro Abs: 3.2 10*3/uL (ref 1.4–7.7)
Neutrophils Relative %: 58.5 % (ref 43.0–77.0)
Platelets: 232 10*3/uL (ref 150.0–400.0)
RBC: 5.11 Mil/uL (ref 3.87–5.11)
RDW: 14.6 % (ref 11.5–15.5)
WBC: 5.4 10*3/uL (ref 4.0–10.5)

## 2017-11-04 LAB — HEMOGLOBIN A1C: Hgb A1c MFr Bld: 11.5 % — ABNORMAL HIGH (ref 4.6–6.5)

## 2017-11-04 LAB — LIPID PANEL
CHOLESTEROL: 216 mg/dL — AB (ref 0–200)
HDL: 49.1 mg/dL (ref >39.00)
LDL CALC: 131 mg/dL — AB (ref 0–99)
NonHDL: 166.74
Total CHOL/HDL Ratio: 4
Triglycerides: 180 mg/dL — ABNORMAL HIGH (ref 0.0–149.0)
VLDL: 36 mg/dL (ref 0.0–40.0)

## 2017-11-04 LAB — TSH: TSH: 1.79 u[IU]/mL (ref 0.35–4.50)

## 2017-11-04 NOTE — Progress Notes (Signed)
Subjective:     Paige Fleming is a 51 y.o. female and is here for a comprehensive physical exam. The patient reports no problems.   Pt also here to f/u htn and cholesterol and dm.    Social History   Socioeconomic History  . Marital status: Married    Spouse name: Not on file  . Number of children: 2  . Years of education: Not on file  . Highest education level: Not on file  Social Needs  . Financial resource strain: Not on file  . Food insecurity - worry: Not on file  . Food insecurity - inability: Not on file  . Transportation needs - medical: Not on file  . Transportation needs - non-medical: Not on file  Occupational History  . Occupation: CLIENT SER REP    Employer: [employer]  . Occupation: Optician, dispensing    Comment: [employer]  Tobacco Use  . Smoking status: Never Smoker  . Smokeless tobacco: Never Used  Substance and Sexual Activity  . Alcohol use: No    Comment: rare  . Drug use: No  . Sexual activity: Yes    Partners: Male  Other Topics Concern  . Not on file  Social History Narrative   Exercise-- walks in am   Health Maintenance  Topic Date Due  . PNEUMOCOCCAL POLYSACCHARIDE VACCINE (1) 02/19/1968  . HIV Screening  02/18/1981  . COLONOSCOPY  02/19/2016  . HEMOGLOBIN A1C  06/12/2016  . INFLUENZA VACCINE  10/31/2018 (Originally 07/31/2017)  . PAP SMEAR  01/18/2018  . FOOT EXAM  05/02/2018  . MAMMOGRAM  08/09/2018  . OPHTHALMOLOGY EXAM  09/11/2018  . TETANUS/TDAP  01/09/2021    The following portions of the patient's history were reviewed and updated as appropriate:  She  has a past medical history of Abortion history (1995), Allergic rhinitis, Asthma, Diabetes mellitus type II, Gestational diabetes mellitus (2001), Hyperlipidemia, and Hypertension. She does not have any pertinent problems on file. She  has a past surgical history that includes Tubal ligation (2001) and Cervical biopsy (2000). Her family history includes Allergies in  her unknown relative; Arthritis in her maternal grandmother and mother; Asthma in her unknown relative; Breast cancer in her unknown relative; Breast cancer (age of onset: 29) in her mother; Cancer in her mother; Crohn's disease in her sister; Diabetes in her father, mother, and paternal aunt; Heart disease (age of onset: 9) in her maternal grandfather; Hyperlipidemia in her father and mother; Hypertension in her maternal aunt, maternal grandmother, and mother. She  reports that  has never smoked. she has never used smokeless tobacco. She reports that she does not drink alcohol or use drugs. She has a current medication list which includes the following prescription(s): azelastine hcl, b complex vitamins, glucose blood, insulin aspart protamine- aspart, insulin pen needle, levocetirizine, lisinopril-hydrochlorothiazide, onetouch delica lancets, and rosuvastatin. Current Outpatient Medications on File Prior to Visit  Medication Sig Dispense Refill  . Azelastine HCl (ASTEPRO) 0.15 % SOLN Place 2 sprays into both nostrils daily. 30 mL 11  . B COMPLEX VITAMINS PO Take 1 drop by mouth daily. Liquid    . glucose blood test strip Use as instructed 100 each 12  . insulin aspart protamine- aspart (NOVOLOG MIX 70/30) (70-30) 100 UNIT/ML injection Inject 30 Units into the skin 2 (two) times daily.    . Insulin Pen Needle (B-D UF III MINI PEN NEEDLES) 31G X 5 MM MISC Use daily with Victoza 100 each 2  . levocetirizine (XYZAL) 5 MG  tablet Take 5 mg by mouth every evening.    Marland Kitchen lisinopril-hydrochlorothiazide (PRINZIDE,ZESTORETIC) 20-12.5 MG tablet Take 2 tablets by mouth daily. 180 tablet 1  . ONETOUCH DELICA LANCETS MISC Check BS daily 100 each 1  . rosuvastatin (CRESTOR) 40 MG tablet Take 40 mg by mouth daily.    . [DISCONTINUED] lisinopril (PRINIVIL,ZESTRIL) 20 MG tablet Take 1 tablet (20 mg total) by mouth daily. 30 tablet 2  . [DISCONTINUED] Saxagliptin-Metformin 04-999 MG TB24 Take 1 tablet by mouth every  evening. 30 tablet 2   No current facility-administered medications on file prior to visit.    She has No Known Allergies..  Review of Systems Review of Systems  Constitutional: Negative for activity change, appetite change and fatigue.  HENT: Negative for hearing loss, congestion, tinnitus and ear discharge.  dentist q71m Eyes: Negative for visual disturbance (see optho q1y -- vision corrected to 20/20 with glasses).  Respiratory: Negative for cough, chest tightness and shortness of breath.   Cardiovascular: Negative for chest pain, palpitations and leg swelling.  Gastrointestinal: Negative for abdominal pain, diarrhea, constipation and abdominal distention.  Genitourinary: Negative for urgency, frequency, decreased urine volume and difficulty urinating.  Musculoskeletal: Negative for back pain, arthralgias and gait problem.  Skin: Negative for color change, pallor and rash.  Neurological: Negative for dizziness, light-headedness, numbness and headaches.  Hematological: Negative for adenopathy. Does not bruise/bleed easily.  Psychiatric/Behavioral: Negative for suicidal ideas, confusion, sleep disturbance, self-injury, dysphoric mood, decreased concentration and agitation.       Objective:    BP 128/78   Pulse 70   Temp 98.1 F (36.7 C)   Ht 5\' 4"  (1.626 m)   Wt 168 lb (76.2 kg)   BMI 28.84 kg/m  General appearance: alert, cooperative, appears stated age and no distress Head: Normocephalic, without obvious abnormality, atraumatic Eyes: conjunctivae/corneas clear. PERRL, EOM's intact. Fundi benign. Ears: normal TM's and external ear canals both ears Nose: Nares normal. Septum midline. Mucosa normal. No drainage or sinus tenderness. Throat: lips, mucosa, and tongue normal; teeth and gums normal Neck: no adenopathy, no carotid bruit, no JVD, supple, symmetrical, trachea midline and thyroid not enlarged, symmetric, no tenderness/mass/nodules Back: symmetric, no curvature. ROM  normal. No CVA tenderness. Lungs: clear to auscultation bilaterally Breasts: gyn Heart: regular rate and rhythm, S1, S2 normal, no murmur, click, rub or gallop Abdomen: soft, non-tender; bowel sounds normal; no masses,  no organomegaly Pelvic: deferred--gyn Extremities: extremities normal, atraumatic, no cyanosis or edema Pulses: 2+ and symmetric Skin: Skin color, texture, turgor normal. No rashes or lesions Lymph nodes: Cervical, supraclavicular, and axillary nodes normal. Neurologic: Alert and oriented X 3, normal strength and tone. Normal symmetric reflexes. Normal coordination and gait    Assessment:    Healthy female exam.      Plan:    ghm utd Check labs See After Visit Summary for Counseling Recommendations    1. Essential hypertension Well controlled, no changes to meds. Encouraged heart healthy diet such as the DASH diet and exercise as tolerated.  - Lipid panel - TSH - CBC with Differential/Platelet - Comprehensive metabolic panel - Hemoglobin A1c  2. Hyperlipidemia LDL goal <100 Tolerating statin, encouraged heart healthy diet, avoid trans fats, minimize simple carbs and saturated fats. Increase exercise as tolerated - Lipid panel - TSH - CBC with Differential/Platelet - Comprehensive metabolic panel - Hemoglobin A1c  3. Hyperglycemia +dm-- per endo - Comprehensive metabolic panel - Hemoglobin A1c  4. Hyperlipidemia LDL goal <70 Encouraged heart healthy diet, increase exercise,  avoid trans fats, consider a krill oil cap daily

## 2017-11-04 NOTE — Patient Instructions (Signed)
Preventive Care 40-64 Years, Female Preventive care refers to lifestyle choices and visits with your health care provider that can promote health and wellness. What does preventive care include?  A yearly physical exam. This is also called an annual well check.  Dental exams once or twice a year.  Routine eye exams. Ask your health care provider how often you should have your eyes checked.  Personal lifestyle choices, including: ? Daily care of your teeth and gums. ? Regular physical activity. ? Eating a healthy diet. ? Avoiding tobacco and drug use. ? Limiting alcohol use. ? Practicing safe sex. ? Taking low-dose aspirin daily starting at age 58. ? Taking vitamin and mineral supplements as recommended by your health care provider. What happens during an annual well check? The services and screenings done by your health care provider during your annual well check will depend on your age, overall health, lifestyle risk factors, and family history of disease. Counseling Your health care provider may ask you questions about your:  Alcohol use.  Tobacco use.  Drug use.  Emotional well-being.  Home and relationship well-being.  Sexual activity.  Eating habits.  Work and work Statistician.  Method of birth control.  Menstrual cycle.  Pregnancy history.  Screening You may have the following tests or measurements:  Height, weight, and BMI.  Blood pressure.  Lipid and cholesterol levels. These may be checked every 5 years, or more frequently if you are over 81 years old.  Skin check.  Lung cancer screening. You may have this screening every year starting at age 78 if you have a 30-pack-year history of smoking and currently smoke or have quit within the past 15 years.  Fecal occult blood test (FOBT) of the stool. You may have this test every year starting at age 65.  Flexible sigmoidoscopy or colonoscopy. You may have a sigmoidoscopy every 5 years or a colonoscopy  every 10 years starting at age 30.  Hepatitis C blood test.  Hepatitis B blood test.  Sexually transmitted disease (STD) testing.  Diabetes screening. This is done by checking your blood sugar (glucose) after you have not eaten for a while (fasting). You may have this done every 1-3 years.  Mammogram. This may be done every 1-2 years. Talk to your health care provider about when you should start having regular mammograms. This may depend on whether you have a family history of breast cancer.  BRCA-related cancer screening. This may be done if you have a family history of breast, ovarian, tubal, or peritoneal cancers.  Pelvic exam and Pap test. This may be done every 3 years starting at age 80. Starting at age 36, this may be done every 5 years if you have a Pap test in combination with an HPV test.  Bone density scan. This is done to screen for osteoporosis. You may have this scan if you are at high risk for osteoporosis.  Discuss your test results, treatment options, and if necessary, the need for more tests with your health care provider. Vaccines Your health care provider may recommend certain vaccines, such as:  Influenza vaccine. This is recommended every year.  Tetanus, diphtheria, and acellular pertussis (Tdap, Td) vaccine. You may need a Td booster every 10 years.  Varicella vaccine. You may need this if you have not been vaccinated.  Zoster vaccine. You may need this after age 5.  Measles, mumps, and rubella (MMR) vaccine. You may need at least one dose of MMR if you were born in  1957 or later. You may also need a second dose.  Pneumococcal 13-valent conjugate (PCV13) vaccine. You may need this if you have certain conditions and were not previously vaccinated.  Pneumococcal polysaccharide (PPSV23) vaccine. You may need one or two doses if you smoke cigarettes or if you have certain conditions.  Meningococcal vaccine. You may need this if you have certain  conditions.  Hepatitis A vaccine. You may need this if you have certain conditions or if you travel or work in places where you may be exposed to hepatitis A.  Hepatitis B vaccine. You may need this if you have certain conditions or if you travel or work in places where you may be exposed to hepatitis B.  Haemophilus influenzae type b (Hib) vaccine. You may need this if you have certain conditions.  Talk to your health care provider about which screenings and vaccines you need and how often you need them. This information is not intended to replace advice given to you by your health care provider. Make sure you discuss any questions you have with your health care provider. Document Released: 01/13/2016 Document Revised: 09/05/2016 Document Reviewed: 10/18/2015 Elsevier Interactive Patient Education  2017 [employer].

## 2017-11-04 NOTE — Assessment & Plan Note (Signed)
Tolerating statin, encouraged heart healthy diet, avoid trans fats, minimize simple carbs and saturated fats. Increase exercise as tolerated 

## 2017-11-04 NOTE — Assessment & Plan Note (Signed)
Well controlled, no changes to meds. Encouraged heart healthy diet such as the DASH diet and exercise as tolerated.  °

## 2017-11-04 NOTE — Assessment & Plan Note (Signed)
Per endo °

## 2017-11-08 ENCOUNTER — Encounter: Payer: Self-pay | Admitting: *Deleted

## 2017-12-15 ENCOUNTER — Other Ambulatory Visit: Payer: Self-pay | Admitting: Family Medicine

## 2017-12-15 DIAGNOSIS — I10 Essential (primary) hypertension: Secondary | ICD-10-CM

## 2017-12-30 DIAGNOSIS — E7800 Pure hypercholesterolemia, unspecified: Secondary | ICD-10-CM | POA: Diagnosis not present

## 2017-12-30 DIAGNOSIS — E1165 Type 2 diabetes mellitus with hyperglycemia: Secondary | ICD-10-CM | POA: Diagnosis not present

## 2017-12-30 DIAGNOSIS — I10 Essential (primary) hypertension: Secondary | ICD-10-CM | POA: Diagnosis not present

## 2018-02-10 DIAGNOSIS — E1165 Type 2 diabetes mellitus with hyperglycemia: Secondary | ICD-10-CM | POA: Diagnosis not present

## 2018-02-10 DIAGNOSIS — I10 Essential (primary) hypertension: Secondary | ICD-10-CM | POA: Diagnosis not present

## 2018-02-10 DIAGNOSIS — E7800 Pure hypercholesterolemia, unspecified: Secondary | ICD-10-CM | POA: Diagnosis not present

## 2018-04-11 DIAGNOSIS — J3089 Other allergic rhinitis: Secondary | ICD-10-CM | POA: Diagnosis not present

## 2018-04-11 DIAGNOSIS — J301 Allergic rhinitis due to pollen: Secondary | ICD-10-CM | POA: Diagnosis not present

## 2018-04-11 DIAGNOSIS — J3081 Allergic rhinitis due to animal (cat) (dog) hair and dander: Secondary | ICD-10-CM | POA: Diagnosis not present

## 2018-04-11 DIAGNOSIS — R21 Rash and other nonspecific skin eruption: Secondary | ICD-10-CM | POA: Diagnosis not present

## 2018-04-22 DIAGNOSIS — J3089 Other allergic rhinitis: Secondary | ICD-10-CM | POA: Diagnosis not present

## 2018-04-22 DIAGNOSIS — J3081 Allergic rhinitis due to animal (cat) (dog) hair and dander: Secondary | ICD-10-CM | POA: Diagnosis not present

## 2018-04-23 DIAGNOSIS — J3081 Allergic rhinitis due to animal (cat) (dog) hair and dander: Secondary | ICD-10-CM | POA: Diagnosis not present

## 2018-05-05 ENCOUNTER — Ambulatory Visit: Payer: Federal, State, Local not specified - PPO | Admitting: Family Medicine

## 2018-05-05 ENCOUNTER — Encounter: Payer: Self-pay | Admitting: Family Medicine

## 2018-05-05 VITALS — BP 132/70 | HR 85 | Temp 98.2°F | Resp 16 | Ht 64.0 in | Wt 161.0 lb

## 2018-05-05 DIAGNOSIS — E785 Hyperlipidemia, unspecified: Secondary | ICD-10-CM | POA: Diagnosis not present

## 2018-05-05 DIAGNOSIS — I10 Essential (primary) hypertension: Secondary | ICD-10-CM

## 2018-05-05 DIAGNOSIS — E1165 Type 2 diabetes mellitus with hyperglycemia: Secondary | ICD-10-CM

## 2018-05-05 DIAGNOSIS — E119 Type 2 diabetes mellitus without complications: Secondary | ICD-10-CM

## 2018-05-05 DIAGNOSIS — M21619 Bunion of unspecified foot: Secondary | ICD-10-CM | POA: Diagnosis not present

## 2018-05-05 LAB — LIPID PANEL
CHOL/HDL RATIO: 4
CHOLESTEROL: 244 mg/dL — AB (ref 0–200)
HDL: 56.1 mg/dL (ref >39.00)
LDL CALC: 171 mg/dL — AB (ref 0–99)
NonHDL: 187.93
Triglycerides: 84 mg/dL (ref 0.0–149.0)
VLDL: 16.8 mg/dL (ref 0.0–40.0)

## 2018-05-05 LAB — COMPREHENSIVE METABOLIC PANEL
ALBUMIN: 3.9 g/dL (ref 3.5–5.2)
ALT: 12 U/L (ref 0–35)
AST: 16 U/L (ref 0–37)
Alkaline Phosphatase: 77 U/L (ref 39–117)
BUN: 10 mg/dL (ref 6–23)
CALCIUM: 9.4 mg/dL (ref 8.4–10.5)
CO2: 29 mEq/L (ref 19–32)
CREATININE: 0.88 mg/dL (ref 0.40–1.20)
Chloride: 99 mEq/L (ref 96–112)
GFR: 86.71 mL/min (ref >60.00)
Glucose, Bld: 158 mg/dL — ABNORMAL HIGH (ref 70–99)
Potassium: 3.8 mEq/L (ref 3.5–5.1)
Sodium: 138 mEq/L (ref 135–145)
TOTAL PROTEIN: 7.2 g/dL (ref 6.0–8.3)
Total Bilirubin: 0.3 mg/dL (ref 0.2–1.2)

## 2018-05-05 LAB — HEMOGLOBIN A1C: HEMOGLOBIN A1C: 11 % — AB (ref 4.6–6.5)

## 2018-05-05 NOTE — Assessment & Plan Note (Signed)
Tolerating statin, encouraged heart healthy diet, avoid trans fats, minimize simple carbs and saturated fats. Increase exercise as tolerated 

## 2018-05-05 NOTE — Progress Notes (Signed)
Patient ID: Paige Fleming, female    DOB: 06-May-1966  Age: 52 y.o. MRN: [id number]    Subjective:  Subjective  HPI Paige Fleming presents for f/u dm, cholesterol and htn.    HYPERTENSION   Blood pressure range-not checking   Chest pain- no      Dyspnea- no Lightheadedness- no   Edema- no  Other side effects - no  Medication compliance: good Low salt diet- yes     DIABETES -- per endo  Blood Sugar ranges-not checking   Polyuria- no New Visual problems- no  Hypoglycemic symptoms- good  Other side effects-no Medication compliance - good Last eye exam- sept 2019 Foot exam- endo   HYPERLIPIDEMIA  Medication compliance- good RUQ pain- no  Muscle aches- no Other side effects-no   Review of Systems  Constitutional: Negative for appetite change, diaphoresis, fatigue and unexpected weight change.  Eyes: Negative for pain, redness and visual disturbance.  Respiratory: Negative for cough, chest tightness, shortness of breath and wheezing.   Cardiovascular: Negative for chest pain, palpitations and leg swelling.  Endocrine: Negative for cold intolerance, heat intolerance, polydipsia, polyphagia and polyuria.  Genitourinary: Negative for difficulty urinating, dysuria and frequency.  Neurological: Negative for dizziness, light-headedness, numbness and headaches.    History Past Medical History:  Diagnosis Date  . Abortion history 1995   G3P2012  . Allergic rhinitis   . Asthma   . Diabetes mellitus type II   . Gestational diabetes mellitus 2001  . Hyperlipidemia   . Hypertension     She has a past surgical history that includes Tubal ligation (2001) and Cervical biopsy (2000).   Her family history includes Allergies in her unknown relative; Arthritis in her maternal grandmother and mother; Asthma in her unknown relative; Breast cancer in her unknown relative; Breast cancer (age of onset: 90) in her mother; Cancer in her mother; Crohn's disease in her  sister; Diabetes in her father, mother, and paternal aunt; Heart disease (age of onset: 54) in her maternal grandfather; Hyperlipidemia in her father and mother; Hypertension in her maternal aunt, maternal grandmother, and mother.She reports that she has never smoked. She has never used smokeless tobacco. She reports that she does not drink alcohol or use drugs.  Current Outpatient Medications on File Prior to Visit  Medication Sig Dispense Refill  . Azelastine HCl (ASTEPRO) 0.15 % SOLN Place 2 sprays into both nostrils daily. 30 mL 11  . B COMPLEX VITAMINS PO Take 1 drop by mouth daily. Liquid    . glucose blood test strip Use as instructed 100 each 12  . insulin aspart protamine- aspart (NOVOLOG MIX 70/30) (70-30) 100 UNIT/ML injection Inject 30 Units into the skin 2 (two) times daily.    Marland Kitchen levocetirizine (XYZAL) 5 MG tablet Take 5 mg by mouth every evening.    Marland Kitchen lisinopril-hydrochlorothiazide (PRINZIDE,ZESTORETIC) 20-12.5 MG tablet TAKE 2 TABLETS BY MOUTH EVERY DAY 180 tablet 1  . ONETOUCH DELICA LANCETS MISC Check BS daily 100 each 1  . rosuvastatin (CRESTOR) 40 MG tablet Take 40 mg by mouth daily.    . [DISCONTINUED] lisinopril (PRINIVIL,ZESTRIL) 20 MG tablet Take 1 tablet (20 mg total) by mouth daily. 30 tablet 2  . [DISCONTINUED] Saxagliptin-Metformin 04-999 MG TB24 Take 1 tablet by mouth every evening. 30 tablet 2   No current facility-administered medications on file prior to visit.      Objective:  Objective  Physical Exam  Constitutional: She is oriented to person, place, and time. She appears well-developed and well-nourished.  HENT:  Head: Normocephalic and atraumatic.  Eyes: Conjunctivae and EOM are normal.  Neck: Normal range of motion. Neck supple. No JVD present. Carotid bruit is not present. No thyromegaly present.  Cardiovascular: Normal rate, regular rhythm and normal heart sounds.  No murmur heard. Pulmonary/Chest: Effort normal and breath sounds normal. No  respiratory distress. She has no wheezes. She has no rales. She exhibits no tenderness.  Musculoskeletal: She exhibits no edema.  Neurological: She is alert and oriented to person, place, and time.  Psychiatric: She has a normal mood and affect.  Nursing note and vitals reviewed.  BP 132/70 (BP Location: Right Arm, Cuff Size: Normal)   Pulse 85   Temp 98.2 F (36.8 C) (Oral)   Resp 16   Ht 5\' 4"  (1.626 m)   Wt 161 lb (73 kg)   LMP 02/17/2018   SpO2 98%   BMI 27.64 kg/m  Wt Readings from Last 3 Encounters:  05/05/18 161 lb (73 kg)  11/04/17 168 lb (76.2 kg)  05/02/17 171 lb 9.6 oz (77.8 kg)     Lab Results  Component Value Date   WBC 5.4 11/04/2017   HGB 13.2 11/04/2017   HCT 40.9 11/04/2017   PLT 232.0 11/04/2017   GLUCOSE 238 (H) 11/04/2017   CHOL 216 (H) 11/04/2017   TRIG 180.0 (H) 11/04/2017   HDL 49.10 11/04/2017   LDLDIRECT 152.3 07/13/2013   LDLCALC 131 (H) 11/04/2017   ALT 12 11/04/2017   AST 13 11/04/2017   NA 134 (L) 11/04/2017   K 3.7 11/04/2017   CL 97 11/04/2017   CREATININE 0.88 11/04/2017   BUN 6 11/04/2017   CO2 27 11/04/2017   TSH 1.79 11/04/2017   HGBA1C 11.5 (H) 11/04/2017   MICROALBUR 1.6 12/13/2015    Mm Screening Breast Tomo Bilateral  Result Date: 08/09/2017 CLINICAL DATA:  Screening. EXAM: 2D DIGITAL SCREENING BILATERAL MAMMOGRAM WITH CAD AND ADJUNCT TOMO COMPARISON:  Previous exam(s). ACR Breast Density Category b: There are scattered areas of fibroglandular density. FINDINGS: There are no findings suspicious for malignancy. Images were processed with CAD. IMPRESSION: No mammographic evidence of malignancy. A result letter of this screening mammogram will be mailed directly to the patient. RECOMMENDATION: Screening mammogram in one year. (Code:SM-B-01Y) BI-RADS CATEGORY  1: Negative. Electronically Signed   By: Harmon Pier M.D.   On: 08/09/2017 14:55     Assessment & Plan:  Plan  I have discontinued Vinia Alexander-Henderson's Insulin Pen  Needle. I am also having her maintain her [facility] DELICA LANCETS, Azelastine HCl, levocetirizine, glucose blood, B COMPLEX VITAMINS PO, insulin aspart protamine- aspart, rosuvastatin, and lisinopril-hydrochlorothiazide.  No orders of the defined types were placed in this encounter.   Problem List Items Addressed This Visit      Unprioritized   Diabetes mellitus type II, uncontrolled (HCC)    Per endo      Essential hypertension - Primary    Well controlled, no changes to meds. Encouraged heart healthy diet such as the DASH diet and exercise as tolerated.       Relevant Orders   Comprehensive metabolic panel   Lipid panel   Hyperlipidemia LDL goal <100    Tolerating statin, encouraged heart healthy diet, avoid trans fats, minimize simple carbs and saturated fats. Increase exercise as tolerated      Relevant Orders   Comprehensive metabolic panel   Lipid panel    Other Visit Diagnoses    Controlled type 2 diabetes mellitus without complication, without long-term current  use of insulin (HCC)       Relevant Orders   Hemoglobin A1c   Ambulatory referral to Podiatry   Bunion       Relevant Orders   Ambulatory referral to Podiatry      Follow-up: Return in about 6 months (around 11/05/2018), or if symptoms worsen or fail to improve, for annual exam, fasting.  Donato Schultz, DO

## 2018-05-05 NOTE — Assessment & Plan Note (Signed)
Well controlled, no changes to meds. Encouraged heart healthy diet such as the DASH diet and exercise as tolerated.  °

## 2018-05-05 NOTE — Patient Instructions (Signed)

## 2018-05-05 NOTE — Assessment & Plan Note (Signed)
Per endo °

## 2018-05-12 ENCOUNTER — Ambulatory Visit: Payer: Federal, State, Local not specified - PPO | Admitting: Podiatry

## 2018-05-12 ENCOUNTER — Ambulatory Visit (INDEPENDENT_AMBULATORY_CARE_PROVIDER_SITE_OTHER): Payer: Federal, State, Local not specified - PPO

## 2018-05-12 ENCOUNTER — Other Ambulatory Visit: Payer: Self-pay

## 2018-05-12 ENCOUNTER — Encounter: Payer: Self-pay | Admitting: Podiatry

## 2018-05-12 VITALS — BP 124/78 | HR 88

## 2018-05-12 DIAGNOSIS — M2012 Hallux valgus (acquired), left foot: Secondary | ICD-10-CM | POA: Diagnosis not present

## 2018-05-12 DIAGNOSIS — M2041 Other hammer toe(s) (acquired), right foot: Secondary | ICD-10-CM | POA: Diagnosis not present

## 2018-05-12 DIAGNOSIS — M2011 Hallux valgus (acquired), right foot: Secondary | ICD-10-CM | POA: Diagnosis not present

## 2018-05-12 MED ORDER — EZETIMIBE 10 MG PO TABS: 10.0000 mg | ORAL_TABLET | Freq: Every day | ORAL | 0 refills | Status: DC

## 2018-05-12 NOTE — Patient Instructions (Signed)

## 2018-05-14 NOTE — Progress Notes (Signed)
Subjective:   Patient ID: Paige Fleming, female   DOB: 52 y.o.   MRN: [id number]   HPI Patient presents with moderate structural bunion deformity bilateral and states that recently she has had some elevation of her sugars with A1c and she is working to get those down.  States the bunion does become bothersome and makes it hard to wear shoe gear at times   Review of Systems  All other systems reviewed and are negative.       Objective:  Physical Exam  Constitutional: She appears well-developed and well-nourished.  Cardiovascular: Intact distal pulses.  Pulmonary/Chest: Effort normal.  Musculoskeletal: Normal range of motion.  Neurological: She is alert.  Skin: Skin is warm.  Nursing note and vitals reviewed.   Neurovascular status intact muscle strength is adequate range of motion within normal limits with patient found to have moderate increase in size of the first metatarsal head bilateral with redness around the joint that are moderately painful when pressed.  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  I do think at one point future bunion correction would be beneficial for her but currently will get a hold off utilize wider shoes softer material soaks and she will be seen back to reevaluate  X-ray indicates there is increase in the elevation of the intermetatarsal angle bilateral

## 2018-06-11 ENCOUNTER — Other Ambulatory Visit: Payer: Self-pay | Admitting: Family Medicine

## 2018-06-25 ENCOUNTER — Other Ambulatory Visit: Payer: Self-pay | Admitting: Family Medicine

## 2018-06-25 DIAGNOSIS — Z1231 Encounter for screening mammogram for malignant neoplasm of breast: Secondary | ICD-10-CM

## 2018-07-23 ENCOUNTER — Other Ambulatory Visit: Payer: Self-pay | Admitting: Family Medicine

## 2018-08-21 ENCOUNTER — Other Ambulatory Visit: Payer: Self-pay | Admitting: Family Medicine

## 2018-08-21 DIAGNOSIS — I10 Essential (primary) hypertension: Secondary | ICD-10-CM

## 2018-08-27 ENCOUNTER — Ambulatory Visit: Payer: Federal, State, Local not specified - PPO

## 2018-08-28 ENCOUNTER — Ambulatory Visit
Admission: RE | Admit: 2018-08-28 | Discharge: 2018-08-28 | Disposition: A | Discharge status: Home / Self Care | Payer: Federal, State, Local not specified - PPO | Source: Ambulatory Visit | Attending: Family Medicine | Admitting: Family Medicine

## 2018-08-28 DIAGNOSIS — Z1231 Encounter for screening mammogram for malignant neoplasm of breast: Secondary | ICD-10-CM

## 2018-08-28 LAB — HM MAMMOGRAPHY

## 2018-10-06 DIAGNOSIS — Z01419 Encounter for gynecological examination (general) (routine) without abnormal findings: Secondary | ICD-10-CM | POA: Diagnosis not present

## 2018-10-06 DIAGNOSIS — Z1151 Encounter for screening for human papillomavirus (HPV): Secondary | ICD-10-CM | POA: Diagnosis not present

## 2018-10-06 DIAGNOSIS — Z6828 Body mass index (BMI) 28.0-28.9, adult: Secondary | ICD-10-CM | POA: Diagnosis not present

## 2018-10-08 LAB — HM PAP SMEAR
HM PAP: NEGATIVE
HM PAP: NEGATIVE

## 2018-11-03 ENCOUNTER — Telehealth: Payer: Self-pay | Admitting: Family Medicine

## 2018-11-03 NOTE — Telephone Encounter (Signed)
Copied from CRM [id number]. Topic: Quick Communication - See Telephone Encounter >> Nov 03, 2018  2:11 PM Jens Som A wrote: CRM for notification. See Telephone encounter for: 11/03/18. Patient is requesting an order to be placed for a cologuard. Please advise [phone] (M)

## 2018-11-04 NOTE — Telephone Encounter (Signed)
Cologuard ordered through [employer] online portal.  

## 2018-11-17 ENCOUNTER — Ambulatory Visit (INDEPENDENT_AMBULATORY_CARE_PROVIDER_SITE_OTHER): Payer: Federal, State, Local not specified - PPO | Admitting: Family Medicine

## 2018-11-17 ENCOUNTER — Encounter: Payer: Self-pay | Admitting: Family Medicine

## 2018-11-17 VITALS — BP 112/71 | HR 92 | Temp 98.8°F | Resp 16 | Ht 64.0 in | Wt 156.2 lb

## 2018-11-17 DIAGNOSIS — Z794 Long term (current) use of insulin: Secondary | ICD-10-CM

## 2018-11-17 DIAGNOSIS — E1165 Type 2 diabetes mellitus with hyperglycemia: Secondary | ICD-10-CM | POA: Insufficient documentation

## 2018-11-17 DIAGNOSIS — E1169 Type 2 diabetes mellitus with other specified complication: Secondary | ICD-10-CM

## 2018-11-17 DIAGNOSIS — Z0000 Encounter for general adult medical examination without abnormal findings: Secondary | ICD-10-CM | POA: Diagnosis not present

## 2018-11-17 DIAGNOSIS — Z114 Encounter for screening for human immunodeficiency virus [HIV]: Secondary | ICD-10-CM | POA: Diagnosis not present

## 2018-11-17 DIAGNOSIS — I10 Essential (primary) hypertension: Secondary | ICD-10-CM

## 2018-11-17 DIAGNOSIS — E785 Hyperlipidemia, unspecified: Secondary | ICD-10-CM

## 2018-11-17 LAB — MICROALBUMIN / CREATININE URINE RATIO
Creatinine,U: 167.4 mg/dL
Microalb Creat Ratio: 0.7 mg/g (ref 0.0–30.0)
Microalb, Ur: 1.2 mg/dL (ref 0.0–1.9)

## 2018-11-17 LAB — LIPID PANEL
CHOL/HDL RATIO: 5
Cholesterol: 285 mg/dL — ABNORMAL HIGH (ref 0–200)
HDL: 55.2 mg/dL (ref >39.00)
LDL CALC: 197 mg/dL — AB (ref 0–99)
NONHDL: 230.1
Triglycerides: 165 mg/dL — ABNORMAL HIGH (ref 0.0–149.0)
VLDL: 33 mg/dL (ref 0.0–40.0)

## 2018-11-17 LAB — COMPREHENSIVE METABOLIC PANEL
ALT: 15 U/L (ref 0–35)
AST: 15 U/L (ref 0–37)
Albumin: 4.3 g/dL (ref 3.5–5.2)
Alkaline Phosphatase: 87 U/L (ref 39–117)
BUN: 14 mg/dL (ref 6–23)
CHLORIDE: 95 meq/L — AB (ref 96–112)
CO2: 31 mEq/L (ref 19–32)
CREATININE: 0.99 mg/dL (ref 0.40–1.20)
Calcium: 10.2 mg/dL (ref 8.4–10.5)
GFR: 75.53 mL/min (ref >60.00)
GLUCOSE: 197 mg/dL — AB (ref 70–99)
POTASSIUM: 3.9 meq/L (ref 3.5–5.1)
SODIUM: 136 meq/L (ref 135–145)
Total Bilirubin: 0.4 mg/dL (ref 0.2–1.2)
Total Protein: 7.1 g/dL (ref 6.0–8.3)

## 2018-11-17 LAB — CBC WITH DIFFERENTIAL/PLATELET
Basophils Absolute: 0 10*3/uL (ref 0.0–0.1)
Basophils Relative: 0.6 % (ref 0.0–3.0)
EOS ABS: 0.1 10*3/uL (ref 0.0–0.7)
EOS PCT: 2.2 % (ref 0.0–5.0)
HCT: 43.1 % (ref 36.0–46.0)
Hemoglobin: 14.2 g/dL (ref 12.0–15.0)
LYMPHS ABS: 2.1 10*3/uL (ref 0.7–4.0)
Lymphocytes Relative: 40.6 % (ref 12.0–46.0)
MCHC: 33 g/dL (ref 30.0–36.0)
MCV: 76.4 fl — ABNORMAL LOW (ref 78.0–100.0)
MONO ABS: 0.5 10*3/uL (ref 0.1–1.0)
Monocytes Relative: 9.1 % (ref 3.0–12.0)
NEUTROS PCT: 47.5 % (ref 43.0–77.0)
Neutro Abs: 2.4 10*3/uL (ref 1.4–7.7)
Platelets: 256 10*3/uL (ref 150.0–400.0)
RBC: 5.64 Mil/uL — ABNORMAL HIGH (ref 3.87–5.11)
RDW: 14.1 % (ref 11.5–15.5)
WBC: 5.2 10*3/uL (ref 4.0–10.5)

## 2018-11-17 LAB — HEMOGLOBIN A1C: HEMOGLOBIN A1C: 14.7 % — AB (ref 4.6–6.5)

## 2018-11-17 LAB — TSH: TSH: 3.1 u[IU]/mL (ref 0.35–4.50)

## 2018-11-17 NOTE — Progress Notes (Signed)
Subjective:     Paige Fleming is a 52 y.o. female and is here for a comprehensive physical exam. The patient reports no problems. Her for f/u bp, chol and dm as well.    HYPERTENSION   Blood pressure range-good  Chest pain- no      Dyspnea- no Lightheadedness- no   Edema- no  Other side effects - no   Medication compliance: good Low salt diet- yes    DIABETES    Blood Sugar ranges-good per pt   Polyuria- no New Visual problems- no  Hypoglycemic symptoms- no  Other side effects-no Medication compliance - good Last eye exam- due Foot exam- today   HYPERLIPIDEMIA  Medication compliance- good RUQ pain- no  Muscle aches- no Other side effects-no     Social History   Socioeconomic History  . Marital status: Legally Separated    Spouse name: Not on file  . Number of children: 2  . Years of education: Not on file  . Highest education level: Not on file  Occupational History  . Occupation: CLIENT SER REP    Employer: [employer]  . Occupation: Optician, dispensing    Comment: [employer]  Social Needs  . Financial resource strain: Not on file  . Food insecurity:    Worry: Not on file    Inability: Not on file  . Transportation needs:    Medical: Not on file    Non-medical: Not on file  Tobacco Use  . Smoking status: Never Smoker  . Smokeless tobacco: Never Used  Substance and Sexual Activity  . Alcohol use: No    Comment: rare  . Drug use: No  . Sexual activity: Yes    Partners: Male  Lifestyle  . Physical activity:    Days per week: Not on file    Minutes per session: Not on file  . Stress: Not on file  Relationships  . Social connections:    Talks on phone: Not on file    Gets together: Not on file    Attends religious service: Not on file    Active member of club or organization: Not on file    Attends meetings of clubs or organizations: Not on file    Relationship status: Not on file  . Intimate partner violence:    Fear of current or  ex partner: Not on file    Emotionally abused: Not on file    Physically abused: Not on file    Forced sexual activity: Not on file  Other Topics Concern  . Not on file  Social History Narrative   Exercise-- walks in am   Health Maintenance  Topic Date Due  . HIV Screening  02/18/1981  . COLONOSCOPY  02/19/2016  . PAP SMEAR  01/18/2018  . FOOT EXAM  05/02/2018  . OPHTHALMOLOGY EXAM  09/11/2018  . HEMOGLOBIN A1C  11/05/2018  . INFLUENZA VACCINE  08/01/2019 (Originally 07/31/2018)  . PNEUMOCOCCAL POLYSACCHARIDE VACCINE AGE 5-64 HIGH RISK  11/17/2020 (Originally 02/19/1968)  . MAMMOGRAM  08/29/2019  . TETANUS/TDAP  01/09/2021    The following portions of the patient's history were reviewed and updated as appropriate: She  has a past medical history of Abortion history (1995), Allergic rhinitis, Asthma, Diabetes mellitus type II, Gestational diabetes mellitus (2001), Hyperlipidemia, and Hypertension. She does not have any pertinent problems on file. She  has a past surgical history that includes Tubal ligation (2001) and Cervical biopsy (2000). Her family history includes Allergies in her unknown relative; Arthritis  in her maternal grandmother and mother; Asthma in her unknown relative; Breast cancer in her cousin, maternal aunt, and unknown relative; Breast cancer (age of onset: 69) in her mother; Cancer in her mother; Crohn's disease in her sister; Diabetes in her father, mother, and paternal aunt; Heart disease (age of onset: 69) in her maternal grandfather; Hyperlipidemia in her father and mother; Hypertension in her maternal aunt, maternal grandmother, and mother. She  reports that she has never smoked. She has never used smokeless tobacco. She reports that she does not drink alcohol or use drugs. She has a current medication list which includes the following prescription(s): azelastine, azelastine hcl, epinephrine, glucose blood, insulin aspart protamine- aspart, insulin pen needle,  levocetirizine, lisinopril-hydrochlorothiazide, onetouch delica lancets, rosuvastatin, ezetimibe, lisinopril, and saxagliptin-metformin. Current Outpatient Medications on File Prior to Visit  Medication Sig Dispense Refill  . azelastine (OPTIVAR) 0.05 % ophthalmic solution INSTILL 1 DROP INTO AFFECTED EYE TWICE A DAY  6  . Azelastine HCl (ASTEPRO) 0.15 % SOLN Place 2 sprays into both nostrils daily. 30 mL 11  . EPINEPHrine 0.3 mg/0.3 mL IJ SOAJ injection AS DIRECTED FOR SYSTEMIC REACTIONS INJECTION 30 DAYS  1  . glucose blood test strip Use as instructed 100 each 12  . insulin aspart protamine- aspart (NOVOLOG MIX 70/30) (70-30) 100 UNIT/ML injection Inject 30 Units into the skin 2 (two) times daily.    . Insulin Pen Needle (B-D UF III MINI PEN NEEDLES) 31G X 5 MM MISC USE DAILY WITH VICTOZA 100 each 2  . levocetirizine (XYZAL) 5 MG tablet Take 5 mg by mouth every evening.    Marland Kitchen lisinopril-hydrochlorothiazide (PRINZIDE,ZESTORETIC) 20-12.5 MG tablet TAKE 2 TABLETS BY MOUTH EVERY DAY 180 tablet 1  . ONETOUCH DELICA LANCETS MISC Check BS daily 100 each 1  . rosuvastatin (CRESTOR) 40 MG tablet Take 40 mg by mouth daily.    Marland Kitchen ezetimibe (ZETIA) 10 MG tablet TAKE 1 TABLET BY MOUTH EVERY DAY (Patient not taking: Reported on 11/17/2018) 30 tablet 0  . [DISCONTINUED] lisinopril (PRINIVIL,ZESTRIL) 20 MG tablet Take 1 tablet (20 mg total) by mouth daily. 30 tablet 2  . [DISCONTINUED] Saxagliptin-Metformin 04-999 MG TB24 Take 1 tablet by mouth every evening. 30 tablet 2   No current facility-administered medications on file prior to visit.    She has No Known Allergies..  Review of Systems Review of Systems  Constitutional: Negative for activity change, appetite change and fatigue.  HENT: Negative for hearing loss, congestion, tinnitus and ear discharge.  dentist q84m Eyes: Negative for visual disturbance (see optho q1y -- vision corrected to 20/20 with glasses).  Respiratory: Negative for cough, chest  tightness and shortness of breath.   Cardiovascular: Negative for chest pain, palpitations and leg swelling.  Gastrointestinal: Negative for abdominal pain, diarrhea, constipation and abdominal distention.  Genitourinary: Negative for urgency, frequency, decreased urine volume and difficulty urinating.  Musculoskeletal: Negative for back pain, arthralgias and gait problem.  Skin: Negative for color change, pallor and rash.  Neurological: Negative for dizziness, light-headedness, numbness and headaches.  Hematological: Negative for adenopathy. Does not bruise/bleed easily.  Psychiatric/Behavioral: Negative for suicidal ideas, confusion, sleep disturbance, self-injury, dysphoric mood, decreased concentration and agitation.      Objective:    BP 112/71 (BP Location: Left Arm, Cuff Size: Normal)   Pulse 92   Temp 98.8 F (37.1 C) (Oral)   Resp 16   Ht 5\' 4"  (1.626 m)   Wt 156 lb 3.2 oz (70.9 kg)   SpO2 100%  BMI 26.81 kg/m  General appearance: alert, cooperative, appears stated age and no distress Head: Normocephalic, without obvious abnormality, atraumatic Eyes: conjunctivae/corneas clear. PERRL, EOM's intact. Fundi benign. Ears: normal TM's and external ear canals both ears Nose: Nares normal. Septum midline. Mucosa normal. No drainage or sinus tenderness. Throat: lips, mucosa, and tongue normal; teeth and gums normal Neck: no adenopathy, no carotid bruit, no JVD, supple, symmetrical, trachea midline and thyroid not enlarged, symmetric, no tenderness/mass/nodules Back: symmetric, no curvature. ROM normal. No CVA tenderness. Lungs: clear to auscultation bilaterally Breasts: gyn Heart: regular rate and rhythm, S1, S2 normal, no murmur, click, rub or gallop Abdomen: soft, non-tender; bowel sounds normal; no masses,  no organomegaly Pelvic: deferred--gyn Extremities: extremities normal, atraumatic, no cyanosis or edema Pulses: 2+ and symmetric Skin: Skin color, texture, turgor  normal. No rashes or lesions Lymph nodes: Cervical, supraclavicular, and axillary nodes normal. Neurologic: Alert and oriented X 3, normal strength and tone. Normal symmetric reflexes. Normal coordination and gait    Diabetic Foot Exam - Simple   Simple Foot Form Diabetic Foot exam was performed with the following findings:  Yes 11/17/2018 12:24 PM  Visual Inspection No deformities, no ulcerations, no other skin breakdown bilaterally:  Yes Sensation Testing Intact to touch and monofilament testing bilaterally:  Yes Pulse Check Posterior Tibialis and Dorsalis pulse intact bilaterally:  Yes Comments     Assessment:    Healthy female exam.      Plan:    ghm utd Check labs  See After Visit Summary for Counseling Recommendations    1. Preventative health care See above - CBC with Differential/Platelet - Comprehensive metabolic panel - Lipid panel - TSH - HIV Antibody (routine testing w rflx)  2. Essential hypertension Well controlled, no changes to meds. Encouraged heart healthy diet such as the DASH diet and exercise as tolerated.   3. Type 2 diabetes mellitus with hyperglycemia, with long-term current use of insulin (HCC) hgba1c to be checked , minimize simple carbs. Increase exercise as tolerated. Continue current meds  - Comprehensive metabolic panel - Lipid panel - Hemoglobin A1c - Microalbumin / creatinine urine ratio  4. Hyperlipidemia associated with type 2 diabetes mellitus (HCC) Tolerating statin, encouraged heart healthy diet, avoid trans fats, minimize simple carbs and saturated fats. Increase exercise as tolerated - Comprehensive metabolic panel - Lipid panel  5. Encounter for screening for HIV  - HIV Antibody (routine testing w rflx)  6. Uncontrolled type 2 diabetes mellitus with hyperglycemia (HCC) See above

## 2018-11-17 NOTE — Assessment & Plan Note (Signed)
hgba1c to be checked, minimize simple carbs. Increase exercise as tolerated. Continue current meds  

## 2018-11-17 NOTE — Assessment & Plan Note (Signed)
Per endo °

## 2018-11-17 NOTE — Patient Instructions (Signed)
Preventive Care 40-64 Years, Female Preventive care refers to lifestyle choices and visits with your health care provider that can promote health and wellness. What does preventive care include?  A yearly physical exam. This is also called an annual well check.  Dental exams once or twice a year.  Routine eye exams. Ask your health care provider how often you should have your eyes checked.  Personal lifestyle choices, including: ? Daily care of your teeth and gums. ? Regular physical activity. ? Eating a healthy diet. ? Avoiding tobacco and drug use. ? Limiting alcohol use. ? Practicing safe sex. ? Taking low-dose aspirin daily starting at age 58. ? Taking vitamin and mineral supplements as recommended by your health care provider. What happens during an annual well check? The services and screenings done by your health care provider during your annual well check will depend on your age, overall health, lifestyle risk factors, and family history of disease. Counseling Your health care provider may ask you questions about your:  Alcohol use.  Tobacco use.  Drug use.  Emotional well-being.  Home and relationship well-being.  Sexual activity.  Eating habits.  Work and work Statistician.  Method of birth control.  Menstrual cycle.  Pregnancy history.  Screening You may have the following tests or measurements:  Height, weight, and BMI.  Blood pressure.  Lipid and cholesterol levels. These may be checked every 5 years, or more frequently if you are over 81 years old.  Skin check.  Lung cancer screening. You may have this screening every year starting at age 78 if you have a 30-pack-year history of smoking and currently smoke or have quit within the past 15 years.  Fecal occult blood test (FOBT) of the stool. You may have this test every year starting at age 65.  Flexible sigmoidoscopy or colonoscopy. You may have a sigmoidoscopy every 5 years or a colonoscopy  every 10 years starting at age 30.  Hepatitis C blood test.  Hepatitis B blood test.  Sexually transmitted disease (STD) testing.  Diabetes screening. This is done by checking your blood sugar (glucose) after you have not eaten for a while (fasting). You may have this done every 1-3 years.  Mammogram. This may be done every 1-2 years. Talk to your health care provider about when you should start having regular mammograms. This may depend on whether you have a family history of breast cancer.  BRCA-related cancer screening. This may be done if you have a family history of breast, ovarian, tubal, or peritoneal cancers.  Pelvic exam and Pap test. This may be done every 3 years starting at age 80. Starting at age 36, this may be done every 5 years if you have a Pap test in combination with an HPV test.  Bone density scan. This is done to screen for osteoporosis. You may have this scan if you are at high risk for osteoporosis.  Discuss your test results, treatment options, and if necessary, the need for more tests with your health care provider. Vaccines Your health care provider may recommend certain vaccines, such as:  Influenza vaccine. This is recommended every year.  Tetanus, diphtheria, and acellular pertussis (Tdap, Td) vaccine. You may need a Td booster every 10 years.  Varicella vaccine. You may need this if you have not been vaccinated.  Zoster vaccine. You may need this after age 5.  Measles, mumps, and rubella (MMR) vaccine. You may need at least one dose of MMR if you were born in  1957 or later. You may also need a second dose.  Pneumococcal 13-valent conjugate (PCV13) vaccine. You may need this if you have certain conditions and were not previously vaccinated.  Pneumococcal polysaccharide (PPSV23) vaccine. You may need one or two doses if you smoke cigarettes or if you have certain conditions.  Meningococcal vaccine. You may need this if you have certain  conditions.  Hepatitis A vaccine. You may need this if you have certain conditions or if you travel or work in places where you may be exposed to hepatitis A.  Hepatitis B vaccine. You may need this if you have certain conditions or if you travel or work in places where you may be exposed to hepatitis B.  Haemophilus influenzae type b (Hib) vaccine. You may need this if you have certain conditions.  Talk to your health care provider about which screenings and vaccines you need and how often you need them. This information is not intended to replace advice given to you by your health care provider. Make sure you discuss any questions you have with your health care provider. Document Released: 01/13/2016 Document Revised: 09/05/2016 Document Reviewed: 10/18/2015 Elsevier Interactive Patient Education  2018 Elsevier Inc.  

## 2018-11-18 LAB — HIV ANTIBODY (ROUTINE TESTING W REFLEX): HIV 1&2 Ab, 4th Generation: NONREACTIVE

## 2018-11-26 ENCOUNTER — Telehealth: Payer: Self-pay

## 2018-11-26 DIAGNOSIS — E1165 Type 2 diabetes mellitus with hyperglycemia: Secondary | ICD-10-CM

## 2018-11-26 DIAGNOSIS — E1169 Type 2 diabetes mellitus with other specified complication: Secondary | ICD-10-CM

## 2018-11-26 DIAGNOSIS — E785 Hyperlipidemia, unspecified: Principal | ICD-10-CM

## 2018-11-26 MED ORDER — FENOFIBRATE 160 MG PO TABS: 160.0000 mg | ORAL_TABLET | Freq: Every day | ORAL | 2 refills | Status: DC

## 2018-11-26 NOTE — Telephone Encounter (Signed)
Author phoned pt. to review labwork from11/18 and medication adherence. No answer, author left detailedd VM. Unable to confirm if pt. Is taking crestor, but fenofibrate rx sent into pharmacy in the meantime per Dr. Ernst Spell order. Lab orders entered per Dr. Laury Axon. Awaiting return call.

## 2019-01-12 ENCOUNTER — Other Ambulatory Visit: Payer: Self-pay | Admitting: Family Medicine

## 2019-01-12 MED ORDER — INSULIN PEN NEEDLE 31G X 5 MM MISC: 2 refills | Status: DC

## 2019-01-12 NOTE — Telephone Encounter (Signed)
Copied from CRM [id number]. Topic: Quick Communication - Rx Refill/Question >> Jan 12, 2019 11:28 AM Arlyss Gandy, NT wrote: Medication: Insulin Pen Needle (B-D UF III MINI PEN NEEDLES) 31G X 5  Pt states it should be for Novolog.   Has the patient contacted their pharmacy? Yes.   (Agent: If no, request that the patient contact the pharmacy for the refill.) (Agent: If yes, when and what did the pharmacy advise?)  Preferred Pharmacy (with phone number or street name): [id number] [place], [place] - [address] [phone] (Phone) [phone] (Fax)    Agent: Please be advised that RX refills may take up to 3 business days. We ask that you follow-up with your pharmacy.

## 2019-01-12 NOTE — Telephone Encounter (Signed)
Will route to office for final disposition; also see CRM # [id number]; pt seen by Dr Laury Axon, [facility], 11/17/2018.

## 2019-01-12 NOTE — Addendum Note (Signed)
Addended byConrad Union D on: 01/12/2019 11:46 AM   Modules accepted: Orders

## 2019-01-12 NOTE — Telephone Encounter (Signed)
Rx sent 

## 2019-01-23 ENCOUNTER — Other Ambulatory Visit: Payer: Self-pay | Admitting: Family Medicine

## 2019-02-14 ENCOUNTER — Other Ambulatory Visit: Payer: Self-pay | Admitting: Family Medicine

## 2019-02-14 DIAGNOSIS — I10 Essential (primary) hypertension: Secondary | ICD-10-CM

## 2019-03-03 ENCOUNTER — Telehealth: Payer: Self-pay | Admitting: *Deleted

## 2019-03-03 NOTE — Telephone Encounter (Signed)
Received Medical records from [facility];, forwarded to provider/SLS 03/03

## 2019-03-13 ENCOUNTER — Encounter: Payer: Self-pay | Admitting: Family Medicine

## 2019-05-05 ENCOUNTER — Other Ambulatory Visit: Payer: Self-pay | Admitting: Family Medicine

## 2019-05-05 DIAGNOSIS — I10 Essential (primary) hypertension: Secondary | ICD-10-CM

## 2019-05-05 NOTE — Telephone Encounter (Signed)
Copied from CRM [id number]. Topic: Quick Communication - Rx Refill/Question >> May 05, 2019  4:49 PM Tamela Oddi wrote: Medication: lisinopril-hydrochlorothiazide (PRINZIDE,ZESTORETIC) 20-12.5 MG tablet / rosuvastatin (CRESTOR) 40 MG tablet  Patient called to request a refill for the above medication  Preferred Pharmacy (with phone number or street name): [id number] [place], [place] - [address] [phone] (Phone) [phone] (Fax)

## 2019-05-06 MED ORDER — LISINOPRIL-HYDROCHLOROTHIAZIDE 20-12.5 MG PO TABS: 2.0000 | ORAL_TABLET | Freq: Every day | ORAL | 0 refills | Status: DC

## 2019-05-06 MED ORDER — ROSUVASTATIN CALCIUM 40 MG PO TABS: 40.0000 mg | ORAL_TABLET | Freq: Every day | ORAL | 0 refills | Status: DC

## 2019-05-06 NOTE — Telephone Encounter (Signed)
Refills have been sent.  

## 2019-05-21 ENCOUNTER — Ambulatory Visit: Payer: Federal, State, Local not specified - PPO | Admitting: Family Medicine

## 2019-07-21 ENCOUNTER — Other Ambulatory Visit: Payer: Self-pay | Admitting: Family Medicine

## 2019-07-21 DIAGNOSIS — Z1231 Encounter for screening mammogram for malignant neoplasm of breast: Secondary | ICD-10-CM

## 2019-07-27 ENCOUNTER — Other Ambulatory Visit: Payer: Self-pay | Admitting: Family Medicine

## 2019-07-30 DIAGNOSIS — E1165 Type 2 diabetes mellitus with hyperglycemia: Secondary | ICD-10-CM | POA: Diagnosis not present

## 2019-07-30 DIAGNOSIS — I10 Essential (primary) hypertension: Secondary | ICD-10-CM | POA: Diagnosis not present

## 2019-07-30 DIAGNOSIS — E7800 Pure hypercholesterolemia, unspecified: Secondary | ICD-10-CM | POA: Diagnosis not present

## 2019-09-03 ENCOUNTER — Other Ambulatory Visit: Payer: Self-pay

## 2019-09-03 ENCOUNTER — Ambulatory Visit
Admission: RE | Admit: 2019-09-03 | Discharge: 2019-09-03 | Disposition: A | Discharge status: Home / Self Care | Payer: Federal, State, Local not specified - PPO | Source: Ambulatory Visit | Attending: Family Medicine | Admitting: Family Medicine

## 2019-09-03 DIAGNOSIS — Z1231 Encounter for screening mammogram for malignant neoplasm of breast: Secondary | ICD-10-CM

## 2019-11-11 DIAGNOSIS — E1165 Type 2 diabetes mellitus with hyperglycemia: Secondary | ICD-10-CM | POA: Diagnosis not present

## 2019-11-11 DIAGNOSIS — I10 Essential (primary) hypertension: Secondary | ICD-10-CM | POA: Diagnosis not present

## 2019-11-11 DIAGNOSIS — E7800 Pure hypercholesterolemia, unspecified: Secondary | ICD-10-CM | POA: Diagnosis not present

## 2019-11-11 DIAGNOSIS — E039 Hypothyroidism, unspecified: Secondary | ICD-10-CM | POA: Diagnosis not present

## 2020-01-03 ENCOUNTER — Other Ambulatory Visit: Payer: Self-pay | Admitting: Family Medicine

## 2020-01-03 DIAGNOSIS — I10 Essential (primary) hypertension: Secondary | ICD-10-CM

## 2020-01-05 NOTE — Telephone Encounter (Signed)
Pt has not been seen since 2019. Lisinopril / hctz refill denied. Spoke with pt and scheduled Virtual Visit for 01/19/20 at 1:40pm. Pt states she has enough medication to last until her visit and will get refills at that time.

## 2020-01-19 ENCOUNTER — Encounter: Payer: Self-pay | Admitting: Family Medicine

## 2020-01-19 ENCOUNTER — Ambulatory Visit (INDEPENDENT_AMBULATORY_CARE_PROVIDER_SITE_OTHER): Payer: Federal, State, Local not specified - PPO | Admitting: Family Medicine

## 2020-01-19 ENCOUNTER — Other Ambulatory Visit: Payer: Self-pay

## 2020-01-19 VITALS — BP 126/62 | HR 100 | Temp 98.8°F | Ht 64.0 in

## 2020-01-19 DIAGNOSIS — E785 Hyperlipidemia, unspecified: Secondary | ICD-10-CM | POA: Diagnosis not present

## 2020-01-19 DIAGNOSIS — Z01419 Encounter for gynecological examination (general) (routine) without abnormal findings: Secondary | ICD-10-CM | POA: Diagnosis not present

## 2020-01-19 DIAGNOSIS — Z6827 Body mass index (BMI) 27.0-27.9, adult: Secondary | ICD-10-CM | POA: Diagnosis not present

## 2020-01-19 DIAGNOSIS — E1169 Type 2 diabetes mellitus with other specified complication: Secondary | ICD-10-CM

## 2020-01-19 DIAGNOSIS — Z1151 Encounter for screening for human papillomavirus (HPV): Secondary | ICD-10-CM | POA: Diagnosis not present

## 2020-01-19 DIAGNOSIS — I10 Essential (primary) hypertension: Secondary | ICD-10-CM

## 2020-01-19 MED ORDER — ROSUVASTATIN CALCIUM 40 MG PO TABS: 40.0000 mg | ORAL_TABLET | Freq: Every day | ORAL | 1 refills | Status: DC

## 2020-01-19 MED ORDER — LISINOPRIL-HYDROCHLOROTHIAZIDE 20-12.5 MG PO TABS: 2.0000 | ORAL_TABLET | Freq: Every day | ORAL | 1 refills | Status: DC

## 2020-01-19 NOTE — Patient Instructions (Signed)
COVID-19 Vaccine Information can be found at: [email or web address] For questions related to vaccine distribution or appointments, please email [email or web address] or call [phone].    

## 2020-01-19 NOTE — Progress Notes (Signed)
Virtual Visit via Video Note  I connected with Paige Fleming on 01/19/20 at  1:40 PM EST by a video enabled telemedicine application and verified that I am speaking with the correct person using two identifiers.  Location: Patient: home alone Provider: office    I discussed the limitations of evaluation and management by telemedicine and the availability of in person appointments. The patient expressed understanding and agreed to proceed.  History of Present Illness: Pt is home with no complaints She needs refills  Past Medical History:  Diagnosis Date  . Abortion history 1995   G3P2012  . Allergic rhinitis   . Asthma   . Diabetes mellitus type II   . Gestational diabetes mellitus 2001  . Hyperlipidemia   . Hypertension    Current Outpatient Medications on File Prior to Visit  Medication Sig Dispense Refill  . azelastine (OPTIVAR) 0.05 % ophthalmic solution INSTILL 1 DROP INTO AFFECTED EYE TWICE A DAY  6  . Azelastine HCl (ASTEPRO) 0.15 % SOLN Place 2 sprays into both nostrils daily. 30 mL 11  . EPINEPHrine 0.3 mg/0.3 mL IJ SOAJ injection AS DIRECTED FOR SYSTEMIC REACTIONS INJECTION 30 DAYS  1  . fenofibrate 160 MG tablet Take 1 tablet (160 mg total) by mouth daily. 30 tablet 2  . glucose blood test strip Use as instructed 100 each 12  . insulin aspart protamine- aspart (NOVOLOG MIX 70/30) (70-30) 100 UNIT/ML injection Inject 30 Units into the skin 2 (two) times daily.    . Insulin Pen Needle 31G X 5 MM MISC USE DAILY WITH VICTOZA 100 each 2  . levocetirizine (XYZAL) 5 MG tablet Take 5 mg by mouth every evening.    Letta Pate DELICA LANCETS MISC Check BS daily 100 each 1  . [DISCONTINUED] lisinopril (PRINIVIL,ZESTRIL) 20 MG tablet Take 1 tablet (20 mg total) by mouth daily. 30 tablet 2  . [DISCONTINUED] Saxagliptin-Metformin 04-999 MG TB24 Take 1 tablet by mouth every evening. 30 tablet 2   No current facility-administered medications on file prior to visit.   No  Known Allergies   Observations/Objective: Vitals:   01/19/20 1337  BP: 126/62  Pulse: 100  Temp: 98.8 F (37.1 C)   Pt is in NAD  Assessment and Plan: 1. Essential hypertension Well controlled, no changes to meds. Encouraged heart healthy diet such as the DASH diet and exercise as tolerated.  - lisinopril-hydrochlorothiazide (ZESTORETIC) 20-12.5 MG tablet; Take 2 tablets by mouth daily.  Dispense: 180 tablet; Refill: 1  2. Hyperlipidemia associated with type 2 diabetes mellitus (HCC) Tolerating statin, encouraged heart healthy diet, avoid trans fats, minimize simple carbs and saturated fats. Increase exercise as tolerated - rosuvastatin (CRESTOR) 40 MG tablet; Take 1 tablet (40 mg total) by mouth daily.  Dispense: 90 tablet; Refill: 1   Follow Up Instructions:    I discussed the assessment and treatment plan with the patient. The patient was provided an opportunity to ask questions and all were answered. The patient agreed with the plan and demonstrated an understanding of the instructions.   The patient was advised to call back or seek an in-person evaluation if the symptoms worsen or if the condition fails to improve as anticipated.   Donato Schultz, DO

## 2020-03-08 ENCOUNTER — Telehealth: Payer: Self-pay

## 2020-03-08 NOTE — Telephone Encounter (Signed)
Advised patient safe to get shot

## 2020-03-08 NOTE — Telephone Encounter (Signed)
Patient called in to see if it is safe for her to get the covid-19 vaccine shot. Please follow up with the patient and advise at [phone] Thanks,

## 2020-04-19 ENCOUNTER — Other Ambulatory Visit: Payer: Self-pay | Admitting: Family Medicine

## 2020-04-19 ENCOUNTER — Telehealth: Payer: Self-pay

## 2020-04-19 MED ORDER — EPINEPHRINE 0.3 MG/0.3ML IJ SOAJ: INTRAMUSCULAR | 1 refills | Status: DC

## 2020-04-19 NOTE — Telephone Encounter (Signed)
Patient called in to see if Dr. Laury Axon could send in a prescription for  EPINEPHrine 0.3 mg/0.3 mL IJ SOAJ injection [[id number]]    [facility] to [id number] [place], [place] - [address]  [address] [place] [address]  Phone:  [phone] Fax:  [phone]  DEA #:  [id number]

## 2020-04-19 NOTE — Telephone Encounter (Signed)
Refill sent.

## 2020-04-19 NOTE — Telephone Encounter (Signed)
Okay to refill? Last filled in 2019 by historical provider. Please advise

## 2020-04-19 NOTE — Telephone Encounter (Signed)
Ok to refill 

## 2020-06-20 ENCOUNTER — Telehealth: Payer: Self-pay | Admitting: Family Medicine

## 2020-06-20 NOTE — Telephone Encounter (Signed)
See below °FYI °

## 2020-06-20 NOTE — Telephone Encounter (Signed)
Im not in the office this afternoon

## 2020-06-20 NOTE — Telephone Encounter (Signed)
Caller: Berlie Call back phone [phone]  Experiencing right ear pain and discomfort for a week, also have a cough, loss voice last Wednesday while on vacation.   I told her unfortunately Dr. Laury Axon - [facility] do not have an available appointment today. Gave patient some alternative to either scheduled an appointment at a different location or I could try to schedule for tomorrow. Patient got upset and stated she was coming to the office anyway.  Please advise.

## 2020-07-18 DIAGNOSIS — Z20822 Contact with and (suspected) exposure to covid-19: Secondary | ICD-10-CM | POA: Diagnosis not present

## 2020-08-14 ENCOUNTER — Other Ambulatory Visit: Payer: Self-pay | Admitting: Family Medicine

## 2020-08-14 DIAGNOSIS — I10 Essential (primary) hypertension: Secondary | ICD-10-CM

## 2020-09-06 ENCOUNTER — Other Ambulatory Visit: Payer: Self-pay | Admitting: Family Medicine

## 2020-09-06 DIAGNOSIS — Z1231 Encounter for screening mammogram for malignant neoplasm of breast: Secondary | ICD-10-CM

## 2020-09-21 ENCOUNTER — Other Ambulatory Visit: Payer: Self-pay

## 2020-09-21 ENCOUNTER — Ambulatory Visit
Admission: RE | Admit: 2020-09-21 | Discharge: 2020-09-21 | Disposition: A | Discharge status: Home / Self Care | Payer: Federal, State, Local not specified - PPO | Source: Ambulatory Visit | Attending: Family Medicine | Admitting: Family Medicine

## 2020-09-21 DIAGNOSIS — Z1231 Encounter for screening mammogram for malignant neoplasm of breast: Secondary | ICD-10-CM

## 2021-01-05 ENCOUNTER — Other Ambulatory Visit: Payer: Federal, State, Local not specified - PPO

## 2021-01-05 ENCOUNTER — Other Ambulatory Visit: Payer: Self-pay

## 2021-01-05 DIAGNOSIS — Z20822 Contact with and (suspected) exposure to covid-19: Secondary | ICD-10-CM | POA: Diagnosis not present

## 2021-01-09 LAB — NOVEL CORONAVIRUS, NAA: SARS-CoV-2, NAA: NOT DETECTED

## 2021-03-06 DIAGNOSIS — E7800 Pure hypercholesterolemia, unspecified: Secondary | ICD-10-CM | POA: Diagnosis not present

## 2021-03-06 DIAGNOSIS — E039 Hypothyroidism, unspecified: Secondary | ICD-10-CM | POA: Diagnosis not present

## 2021-03-06 DIAGNOSIS — I10 Essential (primary) hypertension: Secondary | ICD-10-CM | POA: Diagnosis not present

## 2021-03-06 DIAGNOSIS — E1165 Type 2 diabetes mellitus with hyperglycemia: Secondary | ICD-10-CM | POA: Diagnosis not present

## 2021-03-07 DIAGNOSIS — E039 Hypothyroidism, unspecified: Secondary | ICD-10-CM | POA: Diagnosis not present

## 2021-03-07 DIAGNOSIS — E1165 Type 2 diabetes mellitus with hyperglycemia: Secondary | ICD-10-CM | POA: Diagnosis not present

## 2021-03-09 ENCOUNTER — Other Ambulatory Visit: Payer: Self-pay

## 2021-03-10 ENCOUNTER — Encounter: Payer: Self-pay | Admitting: Family Medicine

## 2021-03-10 ENCOUNTER — Ambulatory Visit (INDEPENDENT_AMBULATORY_CARE_PROVIDER_SITE_OTHER): Payer: Federal, State, Local not specified - PPO | Admitting: Family Medicine

## 2021-03-10 ENCOUNTER — Other Ambulatory Visit: Payer: Self-pay

## 2021-03-10 VITALS — BP 124/70 | HR 82 | Temp 98.7°F | Resp 16 | Ht 64.0 in | Wt 151.6 lb

## 2021-03-10 DIAGNOSIS — E1165 Type 2 diabetes mellitus with hyperglycemia: Secondary | ICD-10-CM

## 2021-03-10 DIAGNOSIS — Z1211 Encounter for screening for malignant neoplasm of colon: Secondary | ICD-10-CM

## 2021-03-10 DIAGNOSIS — E1169 Type 2 diabetes mellitus with other specified complication: Secondary | ICD-10-CM

## 2021-03-10 DIAGNOSIS — Z0000 Encounter for general adult medical examination without abnormal findings: Secondary | ICD-10-CM | POA: Diagnosis not present

## 2021-03-10 DIAGNOSIS — I10 Essential (primary) hypertension: Secondary | ICD-10-CM

## 2021-03-10 DIAGNOSIS — Z23 Encounter for immunization: Secondary | ICD-10-CM | POA: Diagnosis not present

## 2021-03-10 DIAGNOSIS — E785 Hyperlipidemia, unspecified: Secondary | ICD-10-CM

## 2021-03-10 DIAGNOSIS — J3089 Other allergic rhinitis: Secondary | ICD-10-CM

## 2021-03-10 DIAGNOSIS — Z1159 Encounter for screening for other viral diseases: Secondary | ICD-10-CM

## 2021-03-10 LAB — TSH: TSH: 3.09 mIU/L

## 2021-03-10 MED ORDER — AZELASTINE HCL 0.05 % OP SOLN: OPHTHALMIC | 6 refills | Status: DC

## 2021-03-10 MED ORDER — LISINOPRIL-HYDROCHLOROTHIAZIDE 20-12.5 MG PO TABS: 2.0000 | ORAL_TABLET | Freq: Every day | ORAL | 1 refills | Status: DC

## 2021-03-10 MED ORDER — ROSUVASTATIN CALCIUM 40 MG PO TABS: 40.0000 mg | ORAL_TABLET | Freq: Every day | ORAL | 1 refills | Status: DC

## 2021-03-10 MED ORDER — EPINEPHRINE 0.3 MG/0.3ML IJ SOAJ: INTRAMUSCULAR | 1 refills | Status: DC

## 2021-03-10 NOTE — Assessment & Plan Note (Signed)
hgba1c to be checked, minimize simple carbs. Increase exercise as tolerated. Continue current meds  

## 2021-03-10 NOTE — Patient Instructions (Signed)
Preventive Care 55-55 Years Old, Female Preventive care refers to lifestyle choices and visits with your health care provider that can promote health and wellness. This includes:  A yearly physical exam. This is also called an annual wellness visit.  Regular dental and eye exams.  Immunizations.  Screening for certain conditions.  Healthy lifestyle choices, such as: ? Eating a healthy diet. ? Getting regular exercise. ? Not using drugs or products that contain nicotine and tobacco. ? Limiting alcohol use. What can I expect for my preventive care visit? Physical exam Your health care provider will check your:  Height and weight. These may be used to calculate your BMI (body mass index). BMI is a measurement that tells if you are at a healthy weight.  Heart rate and blood pressure.  Body temperature.  Skin for abnormal spots. Counseling Your health care provider may ask you questions about your:  Past medical problems.  Family's medical history.  Alcohol, tobacco, and drug use.  Emotional well-being.  Home life and relationship well-being.  Sexual activity.  Diet, exercise, and sleep habits.  Work and work Statistician.  Access to firearms.  Method of birth control.  Menstrual cycle.  Pregnancy history. What immunizations do I need? Vaccines are usually given at various ages, according to a schedule. Your health care provider will recommend vaccines for you based on your age, medical history, and lifestyle or other factors, such as travel or where you work.   What tests do I need? Blood tests  Lipid and cholesterol levels. These may be checked every 5 years, or more often if you are over 55 years old.  Hepatitis C test.  Hepatitis B test. Screening  Lung cancer screening. You may have this screening every year starting at age 55 if you have a 30-pack-year history of smoking and currently smoke or have quit within the past 15 years.  Colorectal cancer  screening. ? All adults should have this screening starting at age 55 and continuing until age 17. ? Your health care provider may recommend screening at age 55 if you are at increased risk. ? You will have tests every 1-10 years, depending on your results and the type of screening test.  Diabetes screening. ? This is done by checking your blood sugar (glucose) after you have not eaten for a while (fasting). ? You may have this done every 1-3 years.  Mammogram. ? This may be done every 1-2 years. ? Talk with your health care provider about when you should start having regular mammograms. This may depend on whether you have a family history of breast cancer.  BRCA-related cancer screening. This may be done if you have a family history of breast, ovarian, tubal, or peritoneal cancers.  Pelvic exam and Pap test. ? This may be done every 3 years starting at age 55. ? Starting at age 55, this may be done every 5 years if you have a Pap test in combination with an HPV test. Other tests  STD (sexually transmitted disease) testing, if you are at risk.  Bone density scan. This is done to screen for osteoporosis. You may have this scan if you are at high risk for osteoporosis. Talk with your health care provider about your test results, treatment options, and if necessary, the need for more tests. Follow these instructions at home: Eating and drinking  Eat a diet that includes fresh fruits and vegetables, whole grains, lean protein, and low-fat dairy products.  Take vitamin and mineral supplements  as recommended by your health care provider.  Do not drink alcohol if: ? Your health care provider tells you not to drink. ? You are pregnant, may be pregnant, or are planning to become pregnant.  If you drink alcohol: ? Limit how much you have to 0-1 drink a day. ? Be aware of how much alcohol is in your drink. In the [place] one drink equals one 12 oz bottle of beer (355 mL), one 5 oz glass of  wine (148 mL), or one 1 oz glass of hard liquor (44 mL).   Lifestyle  Take daily care of your teeth and gums. Brush your teeth every morning and night with fluoride toothpaste. Floss one time each day.  Stay active. Exercise for at least 30 minutes 5 or more days each week.  Do not use any products that contain nicotine or tobacco, such as cigarettes, e-cigarettes, and chewing tobacco. If you need help quitting, ask your health care provider.  Do not use drugs.  If you are sexually active, practice safe sex. Use a condom or other form of protection to prevent STIs (sexually transmitted infections).  If you do not wish to become pregnant, use a form of birth control. If you plan to become pregnant, see your health care provider for a prepregnancy visit.  If told by your health care provider, take low-dose aspirin daily starting at age 55.  Find healthy ways to cope with stress, such as: ? Meditation, yoga, or listening to music. ? Journaling. ? Talking to a trusted person. ? Spending time with friends and family. Safety  Always wear your seat belt while driving or riding in a vehicle.  Do not drive: ? If you have been drinking alcohol. Do not ride with someone who has been drinking. ? When you are tired or distracted. ? While texting.  Wear a helmet and other protective equipment during sports activities.  If you have firearms in your house, make sure you follow all gun safety procedures. What's next?  Visit your health care provider once a year for an annual wellness visit.  Ask your health care provider how often you should have your eyes and teeth checked.  Stay up to date on all vaccines. This information is not intended to replace advice given to you by your health care provider. Make sure you discuss any questions you have with your health care provider. Document Revised: 09/20/2020 Document Reviewed: 08/28/2018 Elsevier Patient Education  2021 Elsevier Inc.  

## 2021-03-10 NOTE — Assessment & Plan Note (Signed)
Well controlled, no changes to meds. Encouraged heart healthy diet such as the DASH diet and exercise as tolerated.  °

## 2021-03-10 NOTE — Assessment & Plan Note (Signed)
Encouraged heart healthy diet, increase exercise, avoid trans fats, consider a krill oil cap daily 

## 2021-03-10 NOTE — Progress Notes (Signed)
Patient ID: Paige Fleming, female    DOB: 14-Dec-1966  Age: 55 y.o. MRN: [id number]    Subjective:  Subjective  HPI Paige Fleming presents for a comprehensive physical exam. She reports that she is feeling well today. She reports have skin swelling in her neck area a while ago, however symptoms have resolved. She does have minimal redness secondary to various skin allergies. She request for a 40 mg Crestor daily P.O. refill for her PMHx of HLD. She denies any fever,fatigue, rhinorrhea, sore throat, hearing loss, dysuria, chest pain, abdominal pain, HA, rash, SOB, and N/V/D at this time.  Review of Systems  Constitutional: Negative for chills, fatigue, fever and unexpected weight change.  HENT: Negative for hearing loss, rhinorrhea, sore throat and trouble swallowing.   Eyes: Negative for visual disturbance.  Respiratory: Negative for cough, chest tightness and shortness of breath.   Cardiovascular: Negative for chest pain and leg swelling.  Gastrointestinal: Negative for abdominal pain, constipation, diarrhea and nausea.  Genitourinary: Negative for dysuria, frequency, vaginal bleeding, vaginal discharge and vaginal pain.  Musculoskeletal: Negative for arthralgias and myalgias.  Skin: Positive for rash.  Neurological: Negative for headaches.  Hematological: Negative for adenopathy.  Psychiatric/Behavioral: Negative for agitation and dysphoric mood.    History Past Medical History:  Diagnosis Date  . Abortion history 1995   G3P2012  . Allergic rhinitis   . Asthma   . Diabetes mellitus type II   . Gestational diabetes mellitus 2001  . Hyperlipidemia   . Hypertension     She has a past surgical history that includes Tubal ligation (2001) and Cervical biopsy (2000).   Her family history includes Allergies in an other family member; Arthritis in her maternal grandmother and mother; Asthma in an other family member; Breast cancer in her cousin, maternal aunt, and  another family member; Breast cancer (age of onset: 43) in her mother; Cancer in her mother; Crohn's disease in her sister; Diabetes in her father, mother, and paternal aunt; Heart disease (age of onset: 44) in her maternal grandfather; Hyperlipidemia in her father and mother; Hypertension in her maternal aunt, maternal grandmother, and mother.She reports that she has never smoked. She has never used smokeless tobacco. She reports that she does not drink alcohol and does not use drugs.  Current Outpatient Medications on File Prior to Visit  Medication Sig Dispense Refill  . Azelastine HCl (ASTEPRO) 0.15 % SOLN Place 2 sprays into both nostrils daily. 30 mL 11  . fenofibrate 160 MG tablet Take 1 tablet (160 mg total) by mouth daily. 30 tablet 2  . glucose blood test strip Use as instructed 100 each 12  . insulin aspart protamine- aspart (NOVOLOG MIX 70/30) (70-30) 100 UNIT/ML injection Inject 30 Units into the skin 2 (two) times daily.    . Insulin Pen Needle (B-D UF III MINI PEN NEEDLES) 31G X 5 MM MISC USE DAILY WITH NOVOLOG 100 each 2  . levocetirizine (XYZAL) 5 MG tablet Take 5 mg by mouth every evening.    Letta Pate DELICA LANCETS MISC Check BS daily 100 each 1  . [DISCONTINUED] lisinopril (PRINIVIL,ZESTRIL) 20 MG tablet Take 1 tablet (20 mg total) by mouth daily. 30 tablet 2  . [DISCONTINUED] Saxagliptin-Metformin 04-999 MG TB24 Take 1 tablet by mouth every evening. 30 tablet 2   No current facility-administered medications on file prior to visit.     Objective:  Objective  Physical Exam Vitals and nursing note reviewed.  Constitutional:      General: She  is not in acute distress.    Appearance: Normal appearance. She is well-developed. She is not ill-appearing or diaphoretic.  HENT:     Head: Normocephalic and atraumatic.     Right Ear: Tympanic membrane, ear canal and external ear normal. There is no impacted cerumen.     Left Ear: Tympanic membrane, ear canal and external ear  normal. There is no impacted cerumen.     Nose: Nose normal.  Eyes:     General:        Right eye: No discharge.        Left eye: No discharge.     Conjunctiva/sclera: Conjunctivae normal.     Pupils: Pupils are equal, round, and reactive to light.  Neck:     Thyroid: No thyromegaly.     Vascular: No JVD.  Cardiovascular:     Rate and Rhythm: Normal rate and regular rhythm.     Pulses: Normal pulses.     Heart sounds: Normal heart sounds. No murmur heard.   Pulmonary:     Effort: Pulmonary effort is normal. No respiratory distress.     Breath sounds: Normal breath sounds. No wheezing or rales.  Chest:     Chest wall: No tenderness.  Abdominal:     General: Bowel sounds are normal. There is no distension.     Palpations: Abdomen is soft. There is no mass.     Tenderness: There is no abdominal tenderness. There is no guarding or rebound.  Musculoskeletal:        General: No tenderness. Normal range of motion.     Cervical back: Normal range of motion and neck supple.  Lymphadenopathy:     Cervical: No cervical adenopathy.  Skin:    General: Skin is warm and dry.     Findings: No erythema or rash.  Neurological:     Mental Status: She is alert and oriented to person, place, and time.     Cranial Nerves: No cranial nerve deficit.     Deep Tendon Reflexes: Reflexes are normal and symmetric.  Psychiatric:        Behavior: Behavior normal.        Thought Content: Thought content normal.        Judgment: Judgment normal.    BP 124/70 (BP Location: Right Arm, Patient Position: Sitting, Cuff Size: Normal)   Pulse 82   Temp 98.7 F (37.1 C) (Oral)   Resp 16   Ht 5\' 4"  (1.626 m)   Wt 151 lb 9.6 oz (68.8 kg)   SpO2 100%   BMI 26.02 kg/m  Wt Readings from Last 3 Encounters:  03/10/21 151 lb 9.6 oz (68.8 kg)  11/17/18 156 lb 3.2 oz (70.9 kg)  05/05/18 161 lb (73 kg)     Lab Results  Component Value Date   WBC 5.2 11/17/2018   HGB 14.2 11/17/2018   HCT 43.1 11/17/2018    PLT 256.0 11/17/2018   GLUCOSE 197 (H) 11/17/2018   CHOL 285 (H) 11/17/2018   TRIG 165.0 (H) 11/17/2018   HDL 55.20 11/17/2018   LDLDIRECT 152.3 07/13/2013   LDLCALC 197 (H) 11/17/2018   ALT 15 11/17/2018   AST 15 11/17/2018   NA 136 11/17/2018   K 3.9 11/17/2018   CL 95 (L) 11/17/2018   CREATININE 0.99 11/17/2018   BUN 14 11/17/2018   CO2 31 11/17/2018   TSH 3.10 11/17/2018   HGBA1C 14.7 (H) 11/17/2018   MICROALBUR 1.2 11/17/2018    MM 3D  SCREEN BREAST BILATERAL  Result Date: 09/22/2020 CLINICAL DATA:  Screening. EXAM: DIGITAL SCREENING BILATERAL MAMMOGRAM WITH TOMO AND CAD COMPARISON:  Previous exam(s). ACR Breast Density Category b: There are scattered areas of fibroglandular density. FINDINGS: There are no findings suspicious for malignancy. Images were processed with CAD. IMPRESSION: No mammographic evidence of malignancy. A result letter of this screening mammogram will be mailed directly to the patient. RECOMMENDATION: Screening mammogram in one year. (Code:SM-B-01Y) BI-RADS CATEGORY  1: Negative. Electronically Signed   By: Harmon Pier M.D.   On: 09/22/2020 13:15     Assessment & Plan:  Plan   Meds ordered this encounter  Medications  . lisinopril-hydrochlorothiazide (ZESTORETIC) 20-12.5 MG tablet    Sig: Take 2 tablets by mouth daily.    Dispense:  180 tablet    Refill:  1  . DISCONTD: rosuvastatin (CRESTOR) 40 MG tablet    Sig: Take 1 tablet (40 mg total) by mouth daily.    Dispense:  90 tablet    Refill:  1  . EPINEPHrine 0.3 mg/0.3 mL IJ SOAJ injection    Sig: AS DIRECTED FOR SYSTEMIC REACTIONS INJECTION 30 DAYS    Dispense:  1 each    Refill:  1  . rosuvastatin (CRESTOR) 40 MG tablet    Sig: Take 1 tablet (40 mg total) by mouth daily.    Dispense:  90 tablet    Refill:  1  . azelastine (OPTIVAR) 0.05 % ophthalmic solution    Sig: INSTILL 1 DROP INTO AFFECTED EYE TWICE A DAY    Dispense:  6 mL    Refill:  6    Problem List Items Addressed This Visit       Unprioritized   Diabetes mellitus type II, uncontrolled (HCC)    hgba1c to be checked, minimize simple carbs. Increase exercise as tolerated. Continue current meds       Relevant Medications   lisinopril-hydrochlorothiazide (ZESTORETIC) 20-12.5 MG tablet   rosuvastatin (CRESTOR) 40 MG tablet   Essential hypertension    Well controlled, no changes to meds. Encouraged heart healthy diet such as the DASH diet and exercise as tolerated.       Relevant Medications   lisinopril-hydrochlorothiazide (ZESTORETIC) 20-12.5 MG tablet   EPINEPHrine 0.3 mg/0.3 mL IJ SOAJ injection   rosuvastatin (CRESTOR) 40 MG tablet   Other Relevant Orders   CBC with Differential/Platelet   Comprehensive metabolic panel   Lipid panel   Hyperlipidemia associated with type 2 diabetes mellitus (HCC)   Relevant Medications   lisinopril-hydrochlorothiazide (ZESTORETIC) 20-12.5 MG tablet   rosuvastatin (CRESTOR) 40 MG tablet   Other Relevant Orders   CBC with Differential/Platelet   Comprehensive metabolic panel   Lipid panel   Hyperlipidemia LDL goal <100    Encouraged heart healthy diet, increase exercise, avoid trans fats, consider a krill oil cap daily      Relevant Medications   lisinopril-hydrochlorothiazide (ZESTORETIC) 20-12.5 MG tablet   EPINEPHrine 0.3 mg/0.3 mL IJ SOAJ injection   rosuvastatin (CRESTOR) 40 MG tablet    Other Visit Diagnoses    Preventative health care    -  Primary   Relevant Orders   CBC with Differential/Platelet   Comprehensive metabolic panel   Lipid panel   TSH   Environmental and seasonal allergies       Relevant Medications   azelastine (OPTIVAR) 0.05 % ophthalmic solution   Need for hepatitis C screening test       Relevant Orders   Hepatitis C antibody  Need for pneumococcal vaccination       Relevant Orders   Pneumococcal polysaccharide vaccine 23-valent greater than or equal to 2yo subcutaneous/IM (Completed)   Colon cancer screening       Relevant  Orders   Ambulatory referral to Gastroenterology    PAP Smear- Last completed on 10/01/2018, normal, repeat every 3 years.  Mammo- Last completed on 09/21/2020, normal, repeat in 1 year.    Colonoscopy- Due for exam and she is willing to schedule her appt today.   Follow-up: Return in about 6 months (around 09/10/2021), or if symptoms worsen or fail to improve, for hypertension.   I,Alexis Bryant,acting as a Neurosurgeon for [employer], DO.,have documented all relevant documentation on the behalf of Donato Schultz, DO,as directed by  Donato Schultz, DO while in the presence of Donato Schultz, DO.   I, Donato Schultz, DO, have reviewed all documentation for this visit. The documentation on 03/10/21 for the exam, diagnosis, procedures, and orders are all accurate and complete.

## 2021-03-13 LAB — COMPREHENSIVE METABOLIC PANEL
AG Ratio: 1.5 (calc) (ref 1.0–2.5)
ALT: 13 U/L (ref 6–29)
AST: 16 U/L (ref 10–35)
Albumin: 4.3 g/dL (ref 3.6–5.1)
Alkaline phosphatase (APISO): 76 U/L (ref 37–153)
BUN: 15 mg/dL (ref 7–25)
CO2: 26 mmol/L (ref 20–32)
Calcium: 9.7 mg/dL (ref 8.6–10.4)
Chloride: 101 mmol/L (ref 98–110)
Creat: 0.89 mg/dL (ref 0.50–1.05)
Globulin: 2.8 g/dL (calc) (ref 1.9–3.7)
Glucose, Bld: 121 mg/dL — ABNORMAL HIGH (ref 65–99)
Potassium: 4 mmol/L (ref 3.5–5.3)
Sodium: 139 mmol/L (ref 135–146)
Total Bilirubin: 0.3 mg/dL (ref 0.2–1.2)
Total Protein: 7.1 g/dL (ref 6.1–8.1)

## 2021-03-13 LAB — CBC WITH DIFFERENTIAL/PLATELET
Absolute Monocytes: 411 cells/uL (ref 200–950)
Basophils Absolute: 42 cells/uL (ref 0–200)
Basophils Relative: 0.8 %
Eosinophils Absolute: 88 cells/uL (ref 15–500)
Eosinophils Relative: 1.7 %
HCT: 40.6 % (ref 35.0–45.0)
Hemoglobin: 13 g/dL (ref 11.7–15.5)
Lymphs Abs: 1856 cells/uL (ref 850–3900)
MCH: 25.2 pg — ABNORMAL LOW (ref 27.0–33.0)
MCHC: 32 g/dL (ref 32.0–36.0)
MCV: 78.7 fL — ABNORMAL LOW (ref 80.0–100.0)
MPV: 10.7 fL (ref 7.5–12.5)
Monocytes Relative: 7.9 %
Neutro Abs: 2803 cells/uL (ref 1500–7800)
Neutrophils Relative %: 53.9 %
Platelets: 239 10*3/uL (ref 140–400)
RBC: 5.16 10*6/uL — ABNORMAL HIGH (ref 3.80–5.10)
RDW: 14.2 % (ref 11.0–15.0)
Total Lymphocyte: 35.7 %
WBC: 5.2 10*3/uL (ref 3.8–10.8)

## 2021-03-13 LAB — HEPATITIS C ANTIBODY
Hepatitis C Ab: NONREACTIVE
SIGNAL TO CUT-OFF: 0 (ref <1.00)

## 2021-03-13 LAB — LIPID PANEL
Cholesterol: 314 mg/dL — ABNORMAL HIGH (ref <200)
HDL: 69 mg/dL (ref >=50)
LDL Cholesterol (Calc): 220 mg/dL (calc) — ABNORMAL HIGH
Non-HDL Cholesterol (Calc): 245 mg/dL (calc) — ABNORMAL HIGH (ref <130)
Total CHOL/HDL Ratio: 4.6 (calc) (ref <5.0)
Triglycerides: 112 mg/dL (ref <150)

## 2021-03-17 ENCOUNTER — Other Ambulatory Visit: Payer: Self-pay | Admitting: Family Medicine

## 2021-03-17 DIAGNOSIS — E785 Hyperlipidemia, unspecified: Secondary | ICD-10-CM

## 2021-04-20 DIAGNOSIS — E1165 Type 2 diabetes mellitus with hyperglycemia: Secondary | ICD-10-CM | POA: Diagnosis not present

## 2021-04-20 DIAGNOSIS — E039 Hypothyroidism, unspecified: Secondary | ICD-10-CM | POA: Diagnosis not present

## 2021-04-20 DIAGNOSIS — E7800 Pure hypercholesterolemia, unspecified: Secondary | ICD-10-CM | POA: Diagnosis not present

## 2021-04-20 DIAGNOSIS — I10 Essential (primary) hypertension: Secondary | ICD-10-CM | POA: Diagnosis not present

## 2021-04-25 DIAGNOSIS — Z803 Family history of malignant neoplasm of breast: Secondary | ICD-10-CM | POA: Diagnosis not present

## 2021-04-25 DIAGNOSIS — Z01419 Encounter for gynecological examination (general) (routine) without abnormal findings: Secondary | ICD-10-CM | POA: Diagnosis not present

## 2021-04-26 ENCOUNTER — Other Ambulatory Visit: Payer: Self-pay | Admitting: Family Medicine

## 2021-08-09 DIAGNOSIS — E139 Other specified diabetes mellitus without complications: Secondary | ICD-10-CM | POA: Diagnosis not present

## 2021-08-09 DIAGNOSIS — E7800 Pure hypercholesterolemia, unspecified: Secondary | ICD-10-CM | POA: Diagnosis not present

## 2021-08-09 DIAGNOSIS — I10 Essential (primary) hypertension: Secondary | ICD-10-CM | POA: Diagnosis not present

## 2021-08-09 DIAGNOSIS — E039 Hypothyroidism, unspecified: Secondary | ICD-10-CM | POA: Diagnosis not present

## 2021-09-01 ENCOUNTER — Other Ambulatory Visit: Payer: Self-pay | Admitting: Family Medicine

## 2021-09-01 DIAGNOSIS — Z1231 Encounter for screening mammogram for malignant neoplasm of breast: Secondary | ICD-10-CM

## 2021-09-11 ENCOUNTER — Other Ambulatory Visit: Payer: Self-pay

## 2021-09-11 ENCOUNTER — Encounter: Payer: Self-pay | Admitting: Family Medicine

## 2021-09-11 ENCOUNTER — Ambulatory Visit: Payer: Federal, State, Local not specified - PPO | Admitting: Family Medicine

## 2021-09-11 VITALS — BP 130/82 | HR 82 | Temp 98.5°F | Resp 18 | Ht 64.0 in | Wt 164.4 lb

## 2021-09-11 DIAGNOSIS — E1169 Type 2 diabetes mellitus with other specified complication: Secondary | ICD-10-CM

## 2021-09-11 DIAGNOSIS — I10 Essential (primary) hypertension: Secondary | ICD-10-CM | POA: Diagnosis not present

## 2021-09-11 DIAGNOSIS — E1165 Type 2 diabetes mellitus with hyperglycemia: Secondary | ICD-10-CM

## 2021-09-11 DIAGNOSIS — E538 Deficiency of other specified B group vitamins: Secondary | ICD-10-CM | POA: Diagnosis not present

## 2021-09-11 DIAGNOSIS — Z794 Long term (current) use of insulin: Secondary | ICD-10-CM

## 2021-09-11 DIAGNOSIS — E785 Hyperlipidemia, unspecified: Secondary | ICD-10-CM | POA: Diagnosis not present

## 2021-09-11 NOTE — Assessment & Plan Note (Signed)
Encourage heart healthy diet such as MIND or DASH diet, increase exercise, avoid trans fats, simple carbohydrates and processed foods, consider a krill or fish or flaxseed oil cap daily.  °

## 2021-09-11 NOTE — Progress Notes (Signed)
Established Patient Office Visit  Subjective:  Patient ID: Paige Fleming, female    DOB: [birth year]  Age: 55 y.o. MRN: [id number]  CC:  Chief Complaint  Patient presents with   Hypertension   Follow-up    HPI Paige Fleming presents for htn, chol   pt with no complaints   Past Medical History:  Diagnosis Date   Abortion history 1995   G3P2012   Allergic rhinitis    Asthma    Diabetes mellitus type II    Gestational diabetes mellitus 2001   Hyperlipidemia    Hypertension     Past Surgical History:  Procedure Laterality Date   CERVICAL BIOPSY  2000   TUBAL LIGATION  2001    Family History  Problem Relation Age of Onset   Hypertension Mother    Arthritis Mother    Breast cancer Mother 28   Cancer Mother        breast   Diabetes Mother    Hyperlipidemia Mother    Hypertension Maternal Grandmother    Arthritis Maternal Grandmother    Hypertension Maternal Aunt    Breast cancer Maternal Aunt    Crohn's disease Sister        twin   Breast cancer Other        maternal cousin   Diabetes Paternal Aunt    Diabetes Father    Hyperlipidemia Father    Allergies Other        children   Asthma Other        children   Heart disease Maternal Grandfather 11       mi   Breast cancer Cousin     Social History   Socioeconomic History   Marital status: Legally Separated    Spouse name: Not on file   Number of children: 2   Years of education: Not on file   Highest education level: Not on file  Occupational History   Occupation: CLIENT SER REP    Employer: [employer]   Occupation: Optician, dispensing    Comment: [employer]  Tobacco Use   Smoking status: Never   Smokeless tobacco: Never  Substance and Sexual Activity   Alcohol use: No    Comment: rare   Drug use: No   Sexual activity: Yes    Partners: Male  Other Topics Concern   Not on file  Social History Narrative   Exercise-- walks in am   Social Determinants of Health    Financial Resource Strain: Not on file  Food Insecurity: Not on file  Transportation Needs: Not on file  Physical Activity: Not on file  Stress: Not on file  Social Connections: Not on file  Intimate Partner Violence: Not on file    Outpatient Medications Prior to Visit  Medication Sig Dispense Refill   azelastine (OPTIVAR) 0.05 % ophthalmic solution INSTILL 1 DROP INTO AFFECTED EYE TWICE A DAY 6 mL 6   Azelastine HCl (ASTEPRO) 0.15 % SOLN Place 2 sprays into both nostrils daily. 30 mL 11   B-D UF III MINI PEN NEEDLES 31G X 5 MM MISC USE DAILY WITH NOVOLOG 100 each 2   Cyanocobalamin (B12 LIQUID HEALTH BOOSTER PO) Take by mouth.     EPINEPHrine 0.3 mg/0.3 mL IJ SOAJ injection AS DIRECTED FOR SYSTEMIC REACTIONS INJECTION 30 DAYS 1 each 1   fenofibrate 160 MG tablet Take 1 tablet (160 mg total) by mouth daily. 30 tablet 2   glucose blood test strip Use as instructed 100 each  12   insulin aspart protamine- aspart (NOVOLOG MIX 70/30) (70-30) 100 UNIT/ML injection Inject 30 Units into the skin 2 (two) times daily.     levocetirizine (XYZAL) 5 MG tablet Take 5 mg by mouth every evening.     lisinopril-hydrochlorothiazide (ZESTORETIC) 20-12.5 MG tablet Take 2 tablets by mouth daily. 180 tablet 1   ONETOUCH DELICA LANCETS MISC Check BS daily 100 each 1   rosuvastatin (CRESTOR) 40 MG tablet Take 1 tablet (40 mg total) by mouth daily. 90 tablet 1   No facility-administered medications prior to visit.    No Known Allergies  ROS Review of Systems  Constitutional:  Negative for appetite change, diaphoresis, fatigue and unexpected weight change.  Eyes:  Negative for pain, redness and visual disturbance.  Respiratory:  Negative for cough, chest tightness, shortness of breath and wheezing.   Cardiovascular:  Negative for chest pain, palpitations and leg swelling.  Endocrine: Negative for cold intolerance, heat intolerance, polydipsia, polyphagia and polyuria.  Genitourinary:  Negative for  difficulty urinating, dysuria and frequency.  Neurological:  Negative for dizziness, light-headedness, numbness and headaches.     Objective:    Physical Exam Vitals and nursing note reviewed.  Constitutional:      Appearance: She is well-developed.  HENT:     Head: Normocephalic and atraumatic.  Eyes:     Conjunctiva/sclera: Conjunctivae normal.  Neck:     Thyroid: No thyromegaly.     Vascular: No carotid bruit or JVD.  Cardiovascular:     Rate and Rhythm: Normal rate and regular rhythm.     Heart sounds: Normal heart sounds. No murmur heard. Pulmonary:     Effort: Pulmonary effort is normal. No respiratory distress.     Breath sounds: Normal breath sounds. No wheezing or rales.  Chest:     Chest wall: No tenderness.  Musculoskeletal:     Cervical back: Normal range of motion and neck supple.  Neurological:     Mental Status: She is alert and oriented to person, place, and time.    BP 130/82 (BP Location: Left Arm, Patient Position: Sitting, Cuff Size: Normal)   Pulse 82   Temp 98.5 F (36.9 C) (Oral)   Resp 18   Ht 5\' 4"  (1.626 m)   Wt 164 lb 6.4 oz (74.6 kg)   SpO2 98%   BMI 28.22 kg/m  Wt Readings from Last 3 Encounters:  09/11/21 164 lb 6.4 oz (74.6 kg)  03/10/21 151 lb 9.6 oz (68.8 kg)  11/17/18 156 lb 3.2 oz (70.9 kg)     Health Maintenance Due  Topic Date Due   COLONOSCOPY (Pts 45-54yrs Insurance coverage will need to be confirmed)  Never done   Zoster Vaccines- Shingrix (1 of 2) Never done   HEMOGLOBIN A1C  05/18/2019   TETANUS/TDAP  01/09/2021   COVID-19 Vaccine (4 - Booster for Pfizer series) 04/17/2021   PAP SMEAR-Modifier  10/08/2021    There are no preventive care reminders to display for this patient.  Lab Results  Component Value Date   TSH 3.09 03/10/2021   Lab Results  Component Value Date   WBC 5.2 03/10/2021   HGB 13.0 03/10/2021   HCT 40.6 03/10/2021   MCV 78.7 (L) 03/10/2021   PLT 239 03/10/2021   Lab Results  Component  Value Date   NA 139 03/10/2021   K 4.0 03/10/2021   CO2 26 03/10/2021   GLUCOSE 121 (H) 03/10/2021   BUN 15 03/10/2021   CREATININE 0.89 03/10/2021  BILITOT 0.3 03/10/2021   ALKPHOS 87 11/17/2018   AST 16 03/10/2021   ALT 13 03/10/2021   PROT 7.1 03/10/2021   ALBUMIN 4.3 11/17/2018   CALCIUM 9.7 03/10/2021   GFR 75.53 11/17/2018   Lab Results  Component Value Date   CHOL 314 (H) 03/10/2021   Lab Results  Component Value Date   HDL 69 03/10/2021   Lab Results  Component Value Date   LDLCALC 220 (H) 03/10/2021   Lab Results  Component Value Date   TRIG 112 03/10/2021   Lab Results  Component Value Date   CHOLHDL 4.6 03/10/2021   Lab Results  Component Value Date   HGBA1C 14.7 (H) 11/17/2018      Assessment & Plan:   Problem List Items Addressed This Visit       Unprioritized   Essential hypertension    Well controlled, no changes to meds. Encouraged heart healthy diet such as the DASH diet and exercise as tolerated.       Hyperlipidemia associated with type 2 diabetes mellitus (HCC) - Primary    Encourage heart healthy diet such as MIND or DASH diet, increase exercise, avoid trans fats, simple carbohydrates and processed foods, consider a krill or fish or flaxseed oil cap daily.       Relevant Orders   Lipid panel   Comprehensive metabolic panel   CBC with Differential/Platelet   Type 2 diabetes mellitus with hyperglycemia, with long-term current use of insulin (HCC)    Per endo       Other Visit Diagnoses     B12 deficiency       Relevant Orders   Vitamin B12   Primary hypertension           No orders of the defined types were placed in this encounter.   Follow-up: Return in about 6 months (around 03/11/2022), or if symptoms worsen or fail to improve, for fasting, annual exam------ nurse visit friday for shigrix #1 .    Donato Schultz, DO

## 2021-09-11 NOTE — Patient Instructions (Signed)

## 2021-09-11 NOTE — Assessment & Plan Note (Signed)
Well controlled, no changes to meds. Encouraged heart healthy diet such as the DASH diet and exercise as tolerated.  °

## 2021-09-11 NOTE — Assessment & Plan Note (Addendum)
Per endo °

## 2021-09-12 LAB — COMPREHENSIVE METABOLIC PANEL
ALT: 15 U/L (ref 0–35)
AST: 15 U/L (ref 0–37)
Albumin: 4.1 g/dL (ref 3.5–5.2)
Alkaline Phosphatase: 86 U/L (ref 39–117)
BUN: 10 mg/dL (ref 6–23)
CO2: 29 mEq/L (ref 19–32)
Calcium: 9.8 mg/dL (ref 8.4–10.5)
Chloride: 99 mEq/L (ref 96–112)
Creatinine, Ser: 0.96 mg/dL (ref 0.40–1.20)
GFR: 66.58 mL/min (ref >60.00)
Glucose, Bld: 264 mg/dL — ABNORMAL HIGH (ref 70–99)
Potassium: 4.3 mEq/L (ref 3.5–5.1)
Sodium: 137 mEq/L (ref 135–145)
Total Bilirubin: 0.3 mg/dL (ref 0.2–1.2)
Total Protein: 7.1 g/dL (ref 6.0–8.3)

## 2021-09-12 LAB — CBC WITH DIFFERENTIAL/PLATELET
Basophils Absolute: 0 10*3/uL (ref 0.0–0.1)
Basophils Relative: 0.5 % (ref 0.0–3.0)
Eosinophils Absolute: 0.1 10*3/uL (ref 0.0–0.7)
Eosinophils Relative: 2.4 % (ref 0.0–5.0)
HCT: 38.1 % (ref 36.0–46.0)
Hemoglobin: 12.3 g/dL (ref 12.0–15.0)
Lymphocytes Relative: 35.4 % (ref 12.0–46.0)
Lymphs Abs: 2.1 10*3/uL (ref 0.7–4.0)
MCHC: 32.2 g/dL (ref 30.0–36.0)
MCV: 78 fl (ref 78.0–100.0)
Monocytes Absolute: 0.5 10*3/uL (ref 0.1–1.0)
Monocytes Relative: 8.5 % (ref 3.0–12.0)
Neutro Abs: 3.2 10*3/uL (ref 1.4–7.7)
Neutrophils Relative %: 53.2 % (ref 43.0–77.0)
Platelets: 252 10*3/uL (ref 150.0–400.0)
RBC: 4.89 Mil/uL (ref 3.87–5.11)
RDW: 14.3 % (ref 11.5–15.5)
WBC: 5.9 10*3/uL (ref 4.0–10.5)

## 2021-09-12 LAB — LIPID PANEL
Cholesterol: 191 mg/dL (ref 0–200)
HDL: 59.7 mg/dL (ref >39.00)
LDL Cholesterol: 99 mg/dL (ref 0–99)
NonHDL: 131.66
Total CHOL/HDL Ratio: 3
Triglycerides: 165 mg/dL — ABNORMAL HIGH (ref 0.0–149.0)
VLDL: 33 mg/dL (ref 0.0–40.0)

## 2021-09-12 LAB — VITAMIN B12: Vitamin B-12: 735 pg/mL (ref 211–911)

## 2021-09-15 ENCOUNTER — Other Ambulatory Visit: Payer: Self-pay

## 2021-09-15 ENCOUNTER — Ambulatory Visit (INDEPENDENT_AMBULATORY_CARE_PROVIDER_SITE_OTHER): Payer: Federal, State, Local not specified - PPO

## 2021-09-15 ENCOUNTER — Telehealth: Payer: Self-pay

## 2021-09-15 DIAGNOSIS — Z23 Encounter for immunization: Secondary | ICD-10-CM | POA: Diagnosis not present

## 2021-09-15 NOTE — Telephone Encounter (Signed)
While pt was in for nurse visit today she mentioned that she had some forms to be filed out for work in order for her job to give her a gel pad for her keyboard because of her wrist issues. She was wearing her wrist splint in office, says there is no rush in completion. She will pick up when completed.

## 2021-09-15 NOTE — Progress Notes (Signed)
Paige Fleming is a 55 y.o. female presents to the office today for Shingles dose number 1 injection, per physician's orders. Original order: Dr Seabron Spates, DO Zoster 0.5 mL IM was administered L Deltoid today. Patient tolerated injection. Patient due for follow up labs/provider appt: Yes. - see below. Patient next injection due: 2-6 months for 2nd injection, appt made No- Pending appointment in March 2023- advised that that appointment  is within 6 month window and can get 2nd dose then.  Creft, Feliberto Harts

## 2021-09-20 NOTE — Telephone Encounter (Signed)
I have them on my desk

## 2021-09-22 NOTE — Telephone Encounter (Signed)
Pt made aware of forms. Form placed at the front

## 2021-10-05 ENCOUNTER — Ambulatory Visit
Admission: RE | Admit: 2021-10-05 | Discharge: 2021-10-05 | Disposition: A | Discharge status: Home / Self Care | Payer: Federal, State, Local not specified - PPO | Source: Ambulatory Visit | Attending: Family Medicine | Admitting: Family Medicine

## 2021-10-05 ENCOUNTER — Other Ambulatory Visit: Payer: Self-pay

## 2021-10-05 DIAGNOSIS — Z1231 Encounter for screening mammogram for malignant neoplasm of breast: Secondary | ICD-10-CM | POA: Diagnosis not present

## 2022-02-09 ENCOUNTER — Other Ambulatory Visit: Payer: Self-pay | Admitting: Family Medicine

## 2022-02-09 DIAGNOSIS — I10 Essential (primary) hypertension: Secondary | ICD-10-CM

## 2022-02-10 IMAGING — MG DIGITAL SCREENING BILAT W/ TOMO W/ CAD
8 series · 8 of 24 positions shown · non-contrast
Comparison: Previous exam(s).

CLINICAL DATA: Screening.

EXAM:
DIGITAL SCREENING BILATERAL MAMMOGRAM WITH TOMO AND CAD

[R MLO synth-2D]
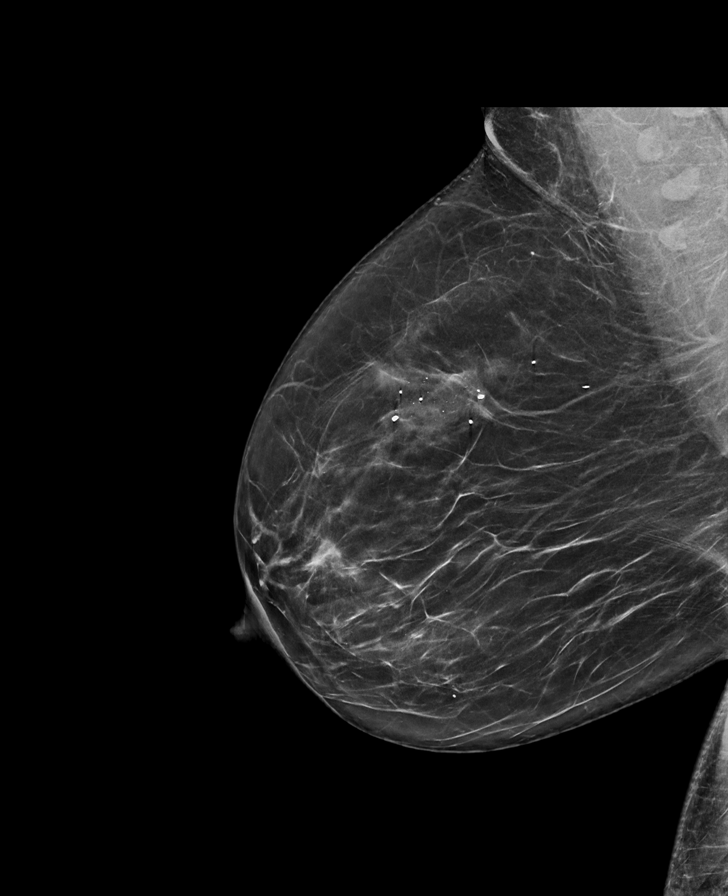

[L CC synth-2D]
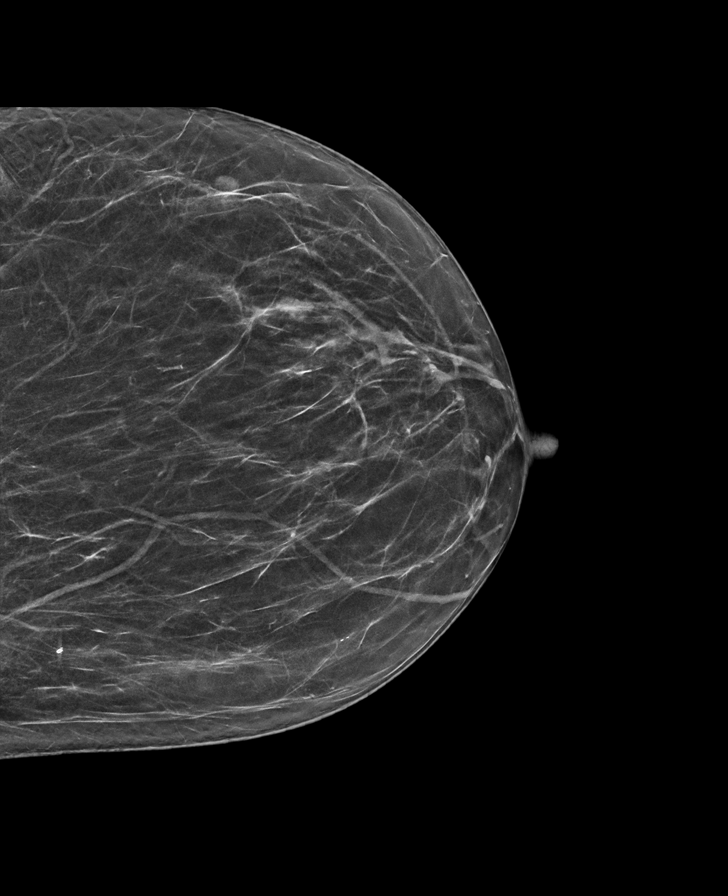

[R CC synth-2D]
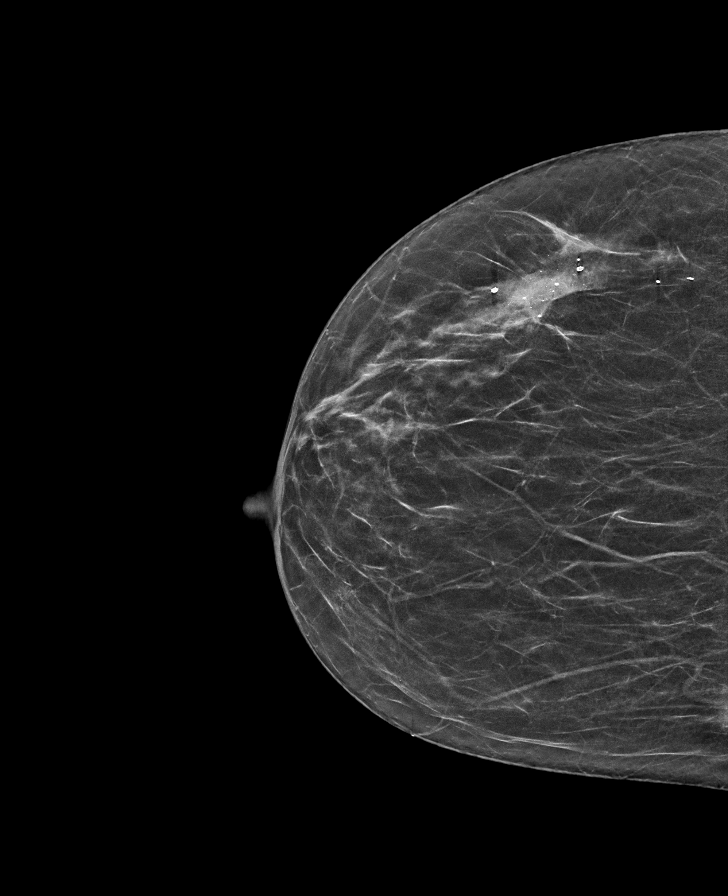

[L MLO synth-2D]
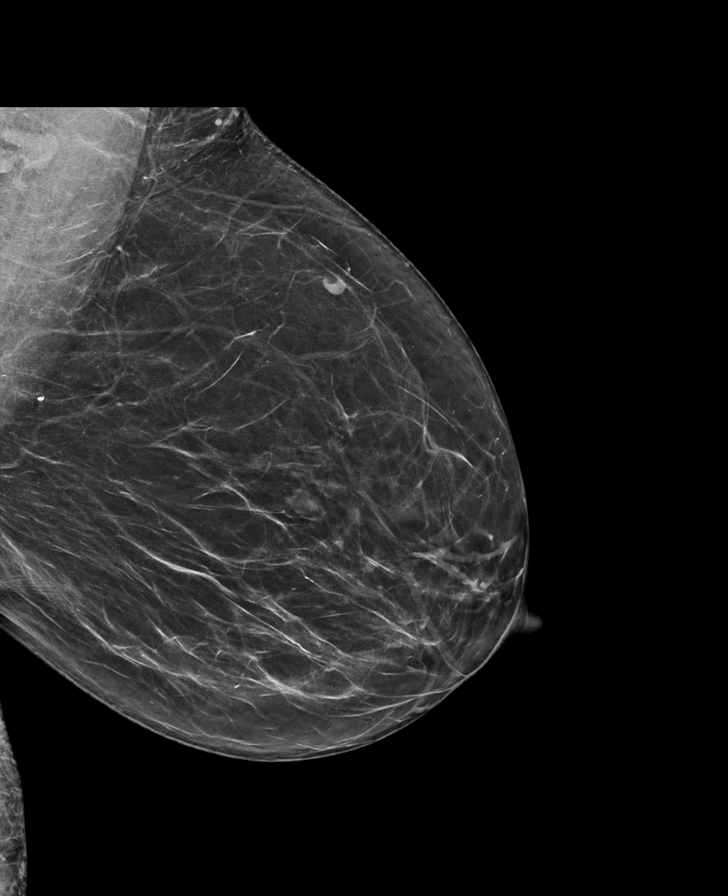

[R MLO tomo · tomo slice 37/74.0]
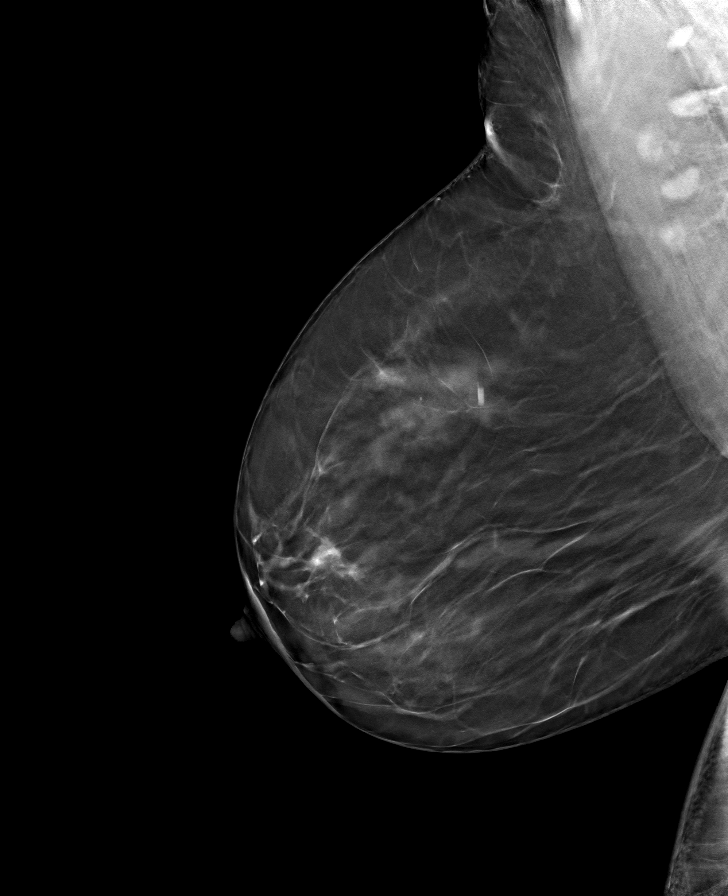

[L MLO tomo · tomo slice 36/71.0]
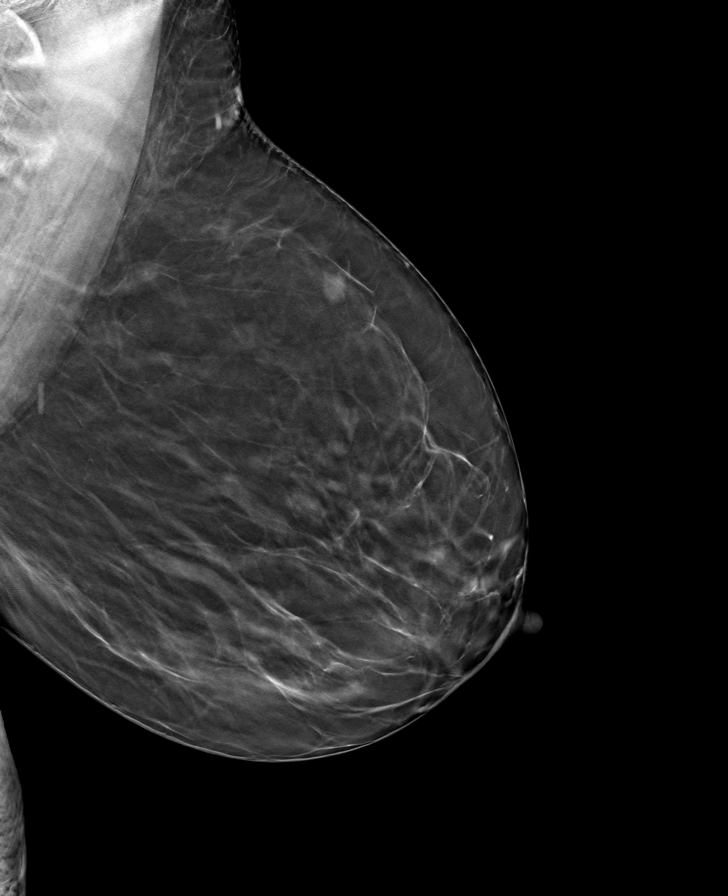

[L CC tomo · tomo slice 31/60.0]
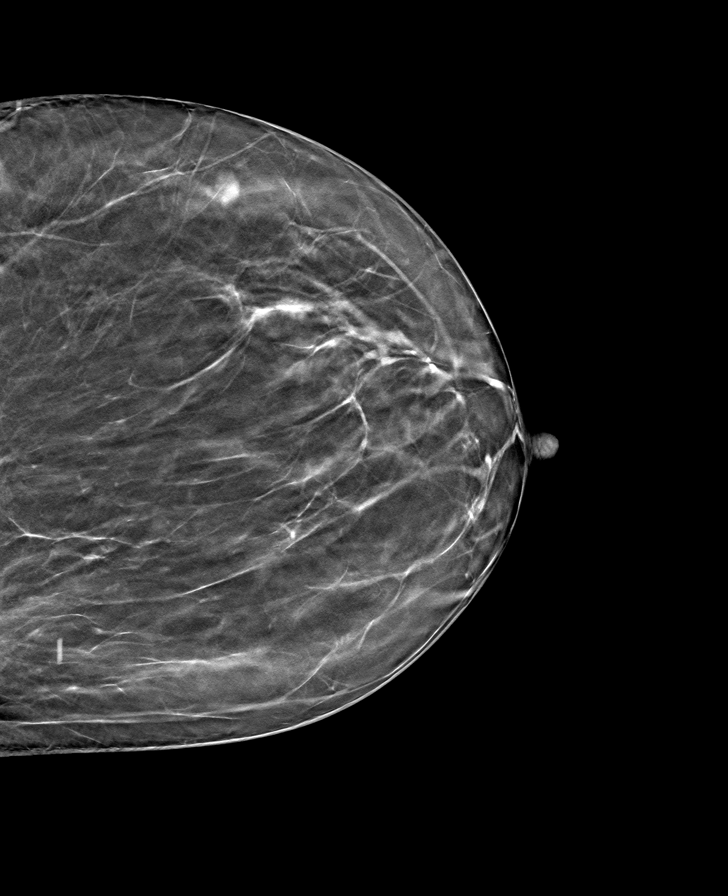

[R CC tomo · tomo slice 29/56.0]
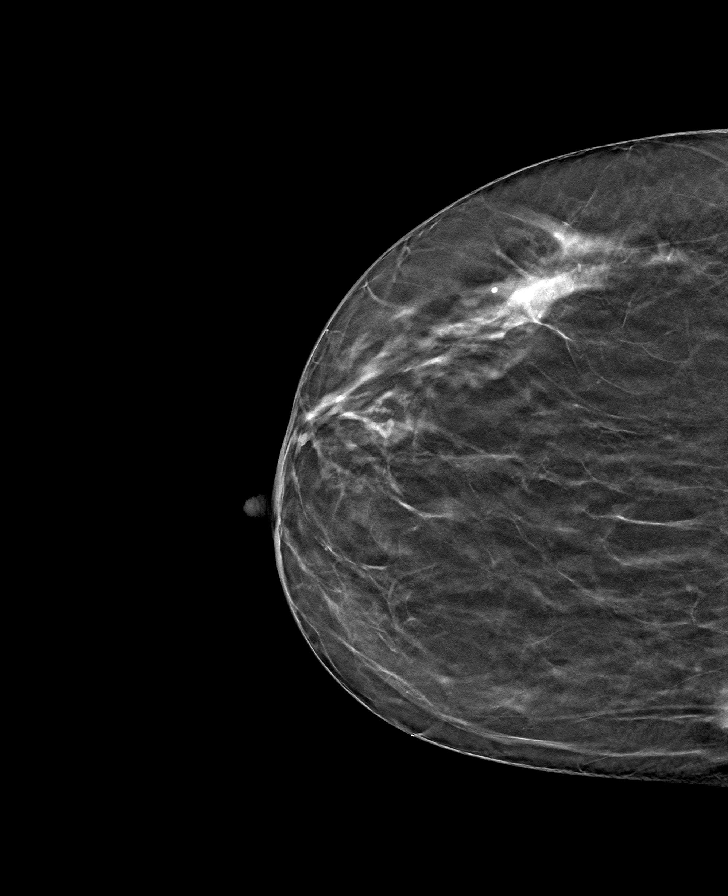

[8 of 24 positions shown; findings below may reference images not displayed]

ACR Breast Density Category b: There are scattered areas of
fibroglandular density.
FINDINGS: There are no findings suspicious for malignancy. Images were
processed with CAD.
IMPRESSION: No mammographic evidence of malignancy. A result letter of this
screening mammogram will be mailed directly to the patient.

RECOMMENDATION:
Screening mammogram in one year. (Code:[id number])

BI-RADS CATEGORY  1: Negative.

## 2022-02-13 DIAGNOSIS — I10 Essential (primary) hypertension: Secondary | ICD-10-CM | POA: Diagnosis not present

## 2022-02-13 DIAGNOSIS — E039 Hypothyroidism, unspecified: Secondary | ICD-10-CM | POA: Diagnosis not present

## 2022-02-13 DIAGNOSIS — Z794 Long term (current) use of insulin: Secondary | ICD-10-CM | POA: Diagnosis not present

## 2022-02-13 DIAGNOSIS — E7800 Pure hypercholesterolemia, unspecified: Secondary | ICD-10-CM | POA: Diagnosis not present

## 2022-03-30 ENCOUNTER — Ambulatory Visit (INDEPENDENT_AMBULATORY_CARE_PROVIDER_SITE_OTHER): Payer: Federal, State, Local not specified - PPO | Admitting: Family Medicine

## 2022-03-30 ENCOUNTER — Encounter: Payer: Self-pay | Admitting: Family Medicine

## 2022-03-30 VITALS — BP 120/80 | HR 85 | Temp 98.8°F | Resp 18 | Ht 64.0 in | Wt 160.8 lb

## 2022-03-30 DIAGNOSIS — E785 Hyperlipidemia, unspecified: Secondary | ICD-10-CM

## 2022-03-30 DIAGNOSIS — Z794 Long term (current) use of insulin: Secondary | ICD-10-CM

## 2022-03-30 DIAGNOSIS — I10 Essential (primary) hypertension: Secondary | ICD-10-CM

## 2022-03-30 DIAGNOSIS — Z0000 Encounter for general adult medical examination without abnormal findings: Secondary | ICD-10-CM

## 2022-03-30 DIAGNOSIS — E1169 Type 2 diabetes mellitus with other specified complication: Secondary | ICD-10-CM

## 2022-03-30 DIAGNOSIS — Z1211 Encounter for screening for malignant neoplasm of colon: Secondary | ICD-10-CM | POA: Diagnosis not present

## 2022-03-30 DIAGNOSIS — E1165 Type 2 diabetes mellitus with hyperglycemia: Secondary | ICD-10-CM

## 2022-03-30 DIAGNOSIS — Z23 Encounter for immunization: Secondary | ICD-10-CM

## 2022-03-30 LAB — CBC WITH DIFFERENTIAL/PLATELET
Basophils Absolute: 0 10*3/uL (ref 0.0–0.1)
Basophils Relative: 0.6 % (ref 0.0–3.0)
Eosinophils Absolute: 0.1 10*3/uL (ref 0.0–0.7)
Eosinophils Relative: 2.4 % (ref 0.0–5.0)
HCT: 39.5 % (ref 36.0–46.0)
Hemoglobin: 12.9 g/dL (ref 12.0–15.0)
Lymphocytes Relative: 34.9 % (ref 12.0–46.0)
Lymphs Abs: 1.8 10*3/uL (ref 0.7–4.0)
MCHC: 32.6 g/dL (ref 30.0–36.0)
MCV: 77.3 fl — ABNORMAL LOW (ref 78.0–100.0)
Monocytes Absolute: 0.5 10*3/uL (ref 0.1–1.0)
Monocytes Relative: 8.8 % (ref 3.0–12.0)
Neutro Abs: 2.8 10*3/uL (ref 1.4–7.7)
Neutrophils Relative %: 53.3 % (ref 43.0–77.0)
Platelets: 223 10*3/uL (ref 150.0–400.0)
RBC: 5.11 Mil/uL (ref 3.87–5.11)
RDW: 14.7 % (ref 11.5–15.5)
WBC: 5.2 10*3/uL (ref 4.0–10.5)

## 2022-03-30 LAB — COMPREHENSIVE METABOLIC PANEL
ALT: 16 U/L (ref 0–35)
AST: 16 U/L (ref 0–37)
Albumin: 4.5 g/dL (ref 3.5–5.2)
Alkaline Phosphatase: 83 U/L (ref 39–117)
BUN: 17 mg/dL (ref 6–23)
CO2: 32 mEq/L (ref 19–32)
Calcium: 10.2 mg/dL (ref 8.4–10.5)
Chloride: 99 mEq/L (ref 96–112)
Creatinine, Ser: 1.01 mg/dL (ref 0.40–1.20)
GFR: 62.41 mL/min (ref >60.00)
Glucose, Bld: 165 mg/dL — ABNORMAL HIGH (ref 70–99)
Potassium: 4.6 mEq/L (ref 3.5–5.1)
Sodium: 138 mEq/L (ref 135–145)
Total Bilirubin: 0.4 mg/dL (ref 0.2–1.2)
Total Protein: 7.2 g/dL (ref 6.0–8.3)

## 2022-03-30 LAB — LIPID PANEL
Cholesterol: 153 mg/dL (ref 0–200)
HDL: 54.1 mg/dL (ref >39.00)
LDL Cholesterol: 84 mg/dL (ref 0–99)
NonHDL: 98.55
Total CHOL/HDL Ratio: 3
Triglycerides: 74 mg/dL (ref 0.0–149.0)
VLDL: 14.8 mg/dL (ref 0.0–40.0)

## 2022-03-30 LAB — TSH: TSH: 2.16 u[IU]/mL (ref 0.35–5.50)

## 2022-03-30 MED ORDER — ROSUVASTATIN CALCIUM 40 MG PO TABS: 40.0000 mg | ORAL_TABLET | Freq: Every day | ORAL | 1 refills | Status: DC

## 2022-03-30 NOTE — Assessment & Plan Note (Signed)
ghm utd Check labs  

## 2022-03-30 NOTE — Assessment & Plan Note (Signed)
Encourage heart healthy diet such as MIND or DASH diet, increase exercise, avoid trans fats, simple carbohydrates and processed foods, consider a krill or fish or flaxseed oil cap daily.  °

## 2022-03-30 NOTE — Assessment & Plan Note (Signed)
Per endo °

## 2022-03-30 NOTE — Progress Notes (Signed)
? ?Subjective:  ? ?By signing my name below, I, Zite Okoli, attest that this documentation has been prepared under the direction and in the presence of Donato Schultz, DO. 03/30/2022 ? ? Patient ID: Paige Fleming, female    DOB: 1966-07-22, 56 y.o.   MRN: [id number] ? ?Chief Complaint  ?Patient presents with  ? Annual Exam  ?  Pt states fasting   ? ? ?HPI ?Patient is in today for a comprehensive physical exam. ? ?She is doing well at this time. She recently started a new schedule in January and it is making her tired.  ? ?UTD on dental and vision care. ? ?She denies fever, hearing loss, ear pain,congestion, sinus pain, sore throat, eye pain, chest pain, palpitations, cough, shortness of breath, wheezing, nausea. vomiting, diarrhea, constipation, blood in stool, dysuria,frequency, hematuria and headaches.  ? ?She does not exercise regularly because of her schedule. ? ?She has 4 Covid-19 vaccines at this time. She is not UTD on tetanus vaccine. She will receive the 2nd dose of the shingles vaccine today.  ? ?No recent changes in family medical history. No recent surgeries. ? ?Past Medical History:  ?Diagnosis Date  ? Abortion history 1995  ? [id number]  ? Allergic rhinitis   ? Asthma   ? Diabetes mellitus type II   ? Gestational diabetes mellitus 2001  ? Hyperlipidemia   ? Hypertension   ? ? ?Past Surgical History:  ?Procedure Laterality Date  ? CERVICAL BIOPSY  2000  ? TUBAL LIGATION  2001  ? ? ?Family History  ?Problem Relation Age of Onset  ? Hypertension Mother   ? Arthritis Mother   ? Breast cancer Mother 39  ? Cancer Mother   ?     breast  ? Diabetes Mother   ? Hyperlipidemia Mother   ? Diabetes Father   ? Hyperlipidemia Father   ? Crohn's disease Sister   ?     twin  ? Hypertension Maternal Aunt   ? Breast cancer Maternal Aunt   ? Diabetes Paternal Aunt   ? Hypertension Maternal Grandmother   ? Arthritis Maternal Grandmother   ? Heart disease Maternal Grandfather 17  ?     mi  ? Breast cancer  Cousin   ? Asthma Other   ?     children  ? ? ?Social History  ? ?Socioeconomic History  ? Marital status: Legally Separated  ?  Spouse name: Not on file  ? Number of children: 2  ? Years of education: Not on file  ? Highest education level: Not on file  ?Occupational History  ? Occupation: CLIENT SER REP  ?  Employer: [employer]  ? Occupation: Optician, dispensing  ?  Comment: [employer]  ?Tobacco Use  ? Smoking status: Never  ? Smokeless tobacco: Never  ?Substance and Sexual Activity  ? Alcohol use: No  ?  Comment: rare  ? Drug use: No  ? Sexual activity: Yes  ?  Partners: Male  ?Other Topics Concern  ? Not on file  ?Social History Narrative  ? Exercise-- walks in am  ? ?Social Determinants of Health  ? ?Financial Resource Strain: Not on file  ?Food Insecurity: Not on file  ?Transportation Needs: Not on file  ?Physical Activity: Not on file  ?Stress: Not on file  ?Social Connections: Not on file  ?Intimate Partner Violence: Not on file  ? ? ?Outpatient Medications Prior to Visit  ?Medication Sig Dispense Refill  ? azelastine (OPTIVAR) 0.05 % ophthalmic  solution INSTILL 1 DROP INTO AFFECTED EYE TWICE A DAY 6 mL 6  ? Azelastine HCl (ASTEPRO) 0.15 % SOLN Place 2 sprays into both nostrils daily. 30 mL 11  ? B-D UF III MINI PEN NEEDLES 31G X 5 MM MISC USE DAILY WITH NOVOLOG 100 each 2  ? Cyanocobalamin (B12 LIQUID HEALTH BOOSTER PO) Take by mouth.    ? EPINEPHrine 0.3 mg/0.3 mL IJ SOAJ injection AS DIRECTED FOR SYSTEMIC REACTIONS INJECTION 30 DAYS 1 each 1  ? fenofibrate 160 MG tablet Take 1 tablet (160 mg total) by mouth daily. 30 tablet 2  ? glucose blood test strip Use as instructed 100 each 12  ? insulin aspart protamine- aspart (NOVOLOG MIX 70/30) (70-30) 100 UNIT/ML injection Inject 30 Units into the skin 2 (two) times daily.    ? levocetirizine (XYZAL) 5 MG tablet Take 5 mg by mouth every evening.    ? lisinopril-hydrochlorothiazide (ZESTORETIC) 20-12.5 MG tablet TAKE 2 TABLETS BY MOUTH DAILY 180 tablet 1  ?  ONETOUCH DELICA LANCETS MISC Check BS daily 100 each 1  ? rosuvastatin (CRESTOR) 40 MG tablet Take 1 tablet (40 mg total) by mouth daily. 90 tablet 1  ? ciclopirox (PENLAC) 8 % solution Apply topically daily.    ? ?No facility-administered medications prior to visit.  ? ? ?No Known Allergies ? ?Review of Systems  ?Constitutional:  Negative for fever.  ?HENT:  Negative for congestion, ear pain, hearing loss, sinus pain and sore throat.   ?Eyes:  Negative for blurred vision and pain.  ?Respiratory:  Negative for cough, sputum production, shortness of breath and wheezing.   ?Cardiovascular:  Negative for chest pain and palpitations.  ?Gastrointestinal:  Negative for blood in stool, constipation, diarrhea, nausea and vomiting.  ?Genitourinary:  Negative for dysuria, frequency, hematuria and urgency.  ?Musculoskeletal:  Negative for back pain, falls and myalgias.  ?Neurological:  Negative for dizziness, sensory change, loss of consciousness, weakness and headaches.  ?Endo/Heme/Allergies:  Negative for environmental allergies. Does not bruise/bleed easily.  ?Psychiatric/Behavioral:  Negative for depression and suicidal ideas. The patient is not nervous/anxious and does not have insomnia.   ? ?   ?Objective:  ?  ?Physical Exam ?Vitals and nursing note reviewed.  ?Constitutional:   ?   General: She is not in acute distress. ?   Appearance: Normal appearance. She is not ill-appearing.  ?HENT:  ?   Head: Normocephalic and atraumatic.  ?   Right Ear: Tympanic membrane, ear canal and external ear normal.  ?   Left Ear: Tympanic membrane, ear canal and external ear normal.  ?Eyes:  ?   Extraocular Movements: Extraocular movements intact.  ?   Pupils: Pupils are equal, round, and reactive to light.  ?Cardiovascular:  ?   Rate and Rhythm: Normal rate and regular rhythm.  ?   Pulses: Normal pulses.  ?   Heart sounds: Normal heart sounds. No murmur heard. ?  No gallop.  ?Pulmonary:  ?   Effort: Pulmonary effort is normal. No  respiratory distress.  ?   Breath sounds: Normal breath sounds. No wheezing, rhonchi or rales.  ?Abdominal:  ?   General: Bowel sounds are normal. There is no distension.  ?   Palpations: Abdomen is soft. There is no mass.  ?   Tenderness: There is no abdominal tenderness. There is no guarding or rebound.  ?   Hernia: No hernia is present.  ?Musculoskeletal:     ?   General: No swelling or tenderness.  ?  Cervical back: Normal range of motion and neck supple.  ?Lymphadenopathy:  ?   Cervical: No cervical adenopathy.  ?Skin: ?   General: Skin is warm and dry.  ?Neurological:  ?   Mental Status: She is alert and oriented to person, place, and time.  ?Psychiatric:     ?   Behavior: Behavior normal.  ? ? ?BP 120/80 (BP Location: Left Arm, Patient Position: Sitting, Cuff Size: Normal)   Pulse 85   Temp 98.8 ?F (37.1 ?C) (Oral)   Resp 18   Ht 5\' 4"  (1.626 m)   Wt 160 lb 12.8 oz (72.9 kg)   LMP 02/17/2018   SpO2 99%   BMI 27.60 kg/m?  ?Wt Readings from Last 3 Encounters:  ?03/30/22 160 lb 12.8 oz (72.9 kg)  ?09/11/21 164 lb 6.4 oz (74.6 kg)  ?03/10/21 151 lb 9.6 oz (68.8 kg)  ? ? ?Diabetic Foot Exam - Simple   ?No data filed ?  ? ?Lab Results  ?Component Value Date  ? WBC 5.9 09/11/2021  ? HGB 12.3 09/11/2021  ? HCT 38.1 09/11/2021  ? PLT 252.0 09/11/2021  ? GLUCOSE 264 (H) 09/11/2021  ? CHOL 191 09/11/2021  ? TRIG 165.0 (H) 09/11/2021  ? HDL 59.70 09/11/2021  ? LDLDIRECT 152.3 07/13/2013  ? LDLCALC 99 09/11/2021  ? ALT 15 09/11/2021  ? AST 15 09/11/2021  ? NA 137 09/11/2021  ? K 4.3 09/11/2021  ? CL 99 09/11/2021  ? CREATININE 0.96 09/11/2021  ? BUN 10 09/11/2021  ? CO2 29 09/11/2021  ? TSH 3.09 03/10/2021  ? HGBA1C 14.7 (H) 11/17/2018  ? MICROALBUR 1.2 11/17/2018  ? ? ?Lab Results  ?Component Value Date  ? TSH 3.09 03/10/2021  ? ?Lab Results  ?Component Value Date  ? WBC 5.9 09/11/2021  ? HGB 12.3 09/11/2021  ? HCT 38.1 09/11/2021  ? MCV 78.0 09/11/2021  ? PLT 252.0 09/11/2021  ? ?Lab Results  ?Component Value  Date  ? NA 137 09/11/2021  ? K 4.3 09/11/2021  ? CO2 29 09/11/2021  ? GLUCOSE 264 (H) 09/11/2021  ? BUN 10 09/11/2021  ? CREATININE 0.96 09/11/2021  ? BILITOT 0.3 09/11/2021  ? ALKPHOS 86 09/11/2021  ? AST 15 09/11/2021

## 2022-03-30 NOTE — Assessment & Plan Note (Signed)
Well controlled, no changes to meds. Encouraged heart healthy diet such as the DASH diet and exercise as tolerated.  °

## 2022-03-30 NOTE — Patient Instructions (Signed)

## 2022-03-30 NOTE — Assessment & Plan Note (Signed)
per orders endo ?

## 2022-03-31 ENCOUNTER — Encounter (HOSPITAL_COMMUNITY): Payer: Self-pay

## 2022-03-31 ENCOUNTER — Ambulatory Visit (HOSPITAL_COMMUNITY)
Admission: EM | Admit: 2022-03-31 | Discharge: 2022-03-31 | Disposition: A | Discharge status: Home / Self Care | Payer: Federal, State, Local not specified - PPO

## 2022-03-31 DIAGNOSIS — T50Z95A Adverse effect of other vaccines and biological substances, initial encounter: Secondary | ICD-10-CM | POA: Diagnosis not present

## 2022-03-31 NOTE — ED Triage Notes (Signed)
Pt presents with generalized body aches and headache after receiving her second shingles vaccine.  ?

## 2022-03-31 NOTE — ED Provider Notes (Signed)
?[facility] ? ? ? ?CSN: [id number] ?Arrival date & time: 03/31/22  1107 ? ? ?  ? ?History   ?Chief Complaint ?Chief Complaint  ?Patient presents with  ? Generalized Body Aches  ? ? ?HPI ?Paige Fleming is a 56 y.o. female.  ? ?The patient is a 56 year old female who presents with side effects after she received her shingles vaccine yesterday.  She states she woke up this morning with fever, headache, and pain in the left upper arm where she was given the vaccination.  She took Tylenol around 10 AM this morning, states that she is starting to feel a little bit better.  She states that she called her physician's office, and was told to come to urgent care for evaluation.  She denies any shortness of breath, difficulty breathing, tongue swelling, feeling like her throat is closing, or lip swelling.  She does have some area of warmth and tenderness in the left upper arm.  She reports that she does have a history of diabetes. ? ?The history is provided by the patient.  ? ?Past Medical History:  ?Diagnosis Date  ? Abortion history 1995  ? [id number]  ? Allergic rhinitis   ? Asthma   ? Diabetes mellitus type II   ? Gestational diabetes mellitus 2001  ? Hyperlipidemia   ? Hypertension   ? ? ?Patient Active Problem List  ? Diagnosis Date Noted  ? Preventative health care 03/30/2022  ? Type 2 diabetes mellitus with hyperglycemia, with long-term current use of insulin (HCC) 11/17/2018  ? Hyperlipidemia associated with type 2 diabetes mellitus (HCC) 11/17/2018  ? Hyperlipidemia LDL goal <100 05/05/2018  ? Cellulitis and abscess of digit 07/06/2013  ? Grief reaction 11/03/2012  ? CELLULITIS, FINGER 01/09/2011  ? URI 12/19/2010  ? Essential hypertension 09/21/2010  ? Diabetes mellitus type II, uncontrolled (HCC) 06/19/2010  ? HYPERGLYCEMIA, FASTING 06/19/2010  ? POSTTRAUMATIC STRESS DISORDER 05/16/2010  ? COUGH 01/25/2009  ? ACUTE SINUSITIS, UNSPECIFIED 08/30/2008  ? ALLERGIC RHINITIS 08/27/2008  ? CONTUSION OF  BUTTOCK 07/20/2008  ? ? ?Past Surgical History:  ?Procedure Laterality Date  ? CERVICAL BIOPSY  2000  ? TUBAL LIGATION  2001  ? ? ?OB History   ?No obstetric history on file. ?  ? ? ? ?Home Medications   ? ?Prior to Admission medications   ?Medication Sig Start Date End Date Taking? Authorizing Provider  ?azelastine (OPTIVAR) 0.05 % ophthalmic solution INSTILL 1 DROP INTO AFFECTED EYE TWICE A DAY 03/10/21   Donato Schultz, DO  ?Azelastine HCl (ASTEPRO) 0.15 % SOLN Place 2 sprays into both nostrils daily. 11/17/12   Donato Schultz, DO  ?B-D UF III MINI PEN NEEDLES 31G X 5 MM MISC USE DAILY WITH NOVOLOG 02/09/22   Zola Button, Grayling Congress, DO  ?ciclopirox (PENLAC) 8 % solution Apply topically daily. 02/16/22   [provider]  ?Cyanocobalamin (B12 LIQUID HEALTH BOOSTER PO) Take by mouth.    [provider]  ?EPINEPHrine 0.3 mg/0.3 mL IJ SOAJ injection AS DIRECTED FOR SYSTEMIC REACTIONS INJECTION 30 DAYS 03/10/21   Donato Schultz, DO  ?fenofibrate 160 MG tablet Take 1 tablet (160 mg total) by mouth daily. 11/26/18   Seabron Spates R, DO  ?glucose blood test strip Use as instructed 12/13/15   Seabron Spates R, DO  ?insulin aspart protamine- aspart (NOVOLOG MIX 70/30) (70-30) 100 UNIT/ML injection Inject 30 Units into the skin 2 (two) times daily.    [provider]  ?levocetirizine (XYZAL) 5 MG tablet Take 5 mg by mouth every evening.    [provider]  ?lisinopril-hydrochlorothiazide (ZESTORETIC) 20-12.5 MG tablet TAKE 2 TABLETS BY MOUTH DAILY 02/09/22   Zola Button, Grayling Congress, DO  ?[facility] DELICA LANCETS MISC Check BS daily 02/25/12   Seabron Spates R, DO  ?rosuvastatin (CRESTOR) 40 MG tablet Take 1 tablet (40 mg total) by mouth daily. 03/30/22   Donato Schultz, DO  ?lisinopril (PRINIVIL,ZESTRIL) 20 MG tablet Take 1 tablet (20 mg total) by mouth daily. 10/22/11 03/06/12  Donato Schultz, DO  ?Saxagliptin-Metformin 04-999 MG TB24 Take 1 tablet by  mouth every evening. 06/27/11 03/06/12  Donato Schultz, DO  ? ? ?Family History ?Family History  ?Problem Relation Age of Onset  ? Hypertension Mother   ? Arthritis Mother   ? Breast cancer Mother 72  ? Cancer Mother   ?     breast  ? Diabetes Mother   ? Hyperlipidemia Mother   ? Diabetes Father   ? Hyperlipidemia Father   ? Crohn's disease Sister   ?     twin  ? Hypertension Maternal Aunt   ? Breast cancer Maternal Aunt   ? Diabetes Paternal Aunt   ? Hypertension Maternal Grandmother   ? Arthritis Maternal Grandmother   ? Heart disease Maternal Grandfather 93  ?     mi  ? Breast cancer Cousin   ? Asthma Other   ?     children  ? ? ?Social History ?Social History  ? ?Tobacco Use  ? Smoking status: Never  ? Smokeless tobacco: Never  ?Substance Use Topics  ? Alcohol use: No  ?  Comment: rare  ? Drug use: No  ? ? ? ?Allergies   ?Patient has no known allergies. ? ? ?Review of Systems ?Review of Systems  ?Constitutional:  Positive for fever.  ?Respiratory: Negative.    ?Cardiovascular: Negative.   ?Gastrointestinal: Negative.   ?Skin:  Positive for color change.  ?Neurological:  Positive for headaches.  ? ? ?Physical Exam ?Triage Vital Signs ?ED Triage Vitals  ?Enc Vitals Group  ?   BP 03/31/22 1252 (!) 141/76  ?   Pulse Rate 03/31/22 1252 100  ?   Resp 03/31/22 1251 18  ?   Temp 03/31/22 1252 98.8 ?F (37.1 ?C)  ?   Temp Source 03/31/22 1251 Oral  ?   SpO2 03/31/22 1252 100 %  ?   Weight --   ?   Height --   ?   Head Circumference --   ?   Peak Flow --   ?   Pain Score 03/31/22 1255 5  ?   Pain Loc --   ?   Pain Edu? --   ?   Excl. in GC? --   ? ?No data found. ? ?Updated Vital Signs ?BP (!) 141/76   Pulse 100   Temp 98.8 ?F (37.1 ?C)   Resp 18   LMP 02/17/2018   SpO2 100%  ? ?Visual Acuity ?Right Eye Distance:   ?Left Eye Distance:   ?Bilateral Distance:   ? ?Right Eye Near:   ?Left Eye Near:    ?Bilateral Near:    ? ?Physical Exam ?Vitals reviewed.  ?Constitutional:   ?   General: She is not in acute  distress. ?   Appearance: Normal appearance.  ?HENT:  ?   Head: Normocephalic and atraumatic.  ?   Right Ear: Tympanic membrane,  ear canal and external ear normal.  ?   Left Ear: Tympanic membrane, ear canal and external ear normal.  ?   Nose: Nose normal.  ?   Mouth/Throat:  ?   Mouth: Mucous membranes are moist.  ?Eyes:  ?   Extraocular Movements: Extraocular movements intact.  ?   Conjunctiva/sclera: Conjunctivae normal.  ?   Pupils: Pupils are equal, round, and reactive to light.  ?Cardiovascular:  ?   Rate and Rhythm: Normal rate and regular rhythm.  ?   Pulses: Normal pulses.  ?   Heart sounds: Normal heart sounds.  ?Pulmonary:  ?   Effort: Pulmonary effort is normal.  ?   Breath sounds: Normal breath sounds.  ?Abdominal:  ?   General: Bowel sounds are normal.  ?   Palpations: Abdomen is soft.  ?   Tenderness: There is no abdominal tenderness.  ?Musculoskeletal:  ?   Cervical back: Normal range of motion and neck supple.  ?Skin: ?   General: Skin is warm and dry.  ?   Capillary Refill: Capillary refill takes less than 2 seconds.  ?   Findings: Erythema present. Rash is not urticarial.  ?   Comments: Left deltoid with round area of erythema, area is warm to palpation. No induration or swelling noted.   ?Neurological:  ?   General: No focal deficit present.  ?   Mental Status: She is alert and oriented to person, place, and time.  ?Psychiatric:     ?   Mood and Affect: Mood normal.     ?   Behavior: Behavior normal.  ? ? ? ?UC Treatments / Results  ?Labs ?(all labs ordered are listed, but only abnormal results are displayed) ?Labs Reviewed - No data to display ? ?EKG ? ? ?Radiology ?No results found. ? ?Procedures ?Procedures (including critical care time) ? ?Medications Ordered in UC ?Medications - No data to display ? ?Initial Impression / Assessment and Plan / UC Course  ?I have reviewed the triage vital signs and the nursing notes. ? ?Pertinent labs & imaging results that were available during my care of the  patient were reviewed by me and considered in my medical decision making (see chart for details). ? ?The patient is a 56 year old female who presents with side effects from a shingles vaccine.  She received the vacc

## 2022-03-31 NOTE — Discharge Instructions (Addendum)
Your side effects to the vaccination appear to be normal. ?Continue Tylenol as needed for pain, fever, or general discomfort. ?Continue cool compresses to the affected extremity. ?Your symptoms should continue to improve; however, if they do not, you should follow-up with your PCP as soon as possible. ?

## 2022-04-02 ENCOUNTER — Telehealth: Payer: Self-pay

## 2022-04-02 NOTE — Telephone Encounter (Signed)
Nurse Assessment ?Nurse: Dolores Frame, RN, Celeste Date/Time (Eastern Time): 03/31/2022 10:11:44 AM ?Confirm and document reason for call. If ?symptomatic, describe symptoms. ?---Caller states she got a shingles shot yesterday and ?woke up this morning with fever, h/a and left arm pain. ?Most recent temp 100.3 oral. ?Does the patient have any new or worsening ?symptoms? ---Yes ?Will a triage be completed? ---Yes ?Related visit to physician within the last 2 weeks? ---Yes ?Does the PT have any chronic conditions? (i.e. ?diabetes, asthma, this includes High risk factors for ?pregnancy, etc.) ?---Yes ?List chronic conditions. ---dm ?Is this a behavioral health or substance abuse call? ---No ?Guidelines ?Guideline Title Affirmed Question Affirmed Notes Nurse Date/Time (Eastern ?Time) ?Immunization ?Reactions ?[1] Fever > 100.0 F ?(37.8 C) AND [2] ?diabetes mellitus ?or weak immune ?system (e.g., HIV ?positive, cancer ?chemo, splenectomy, ?organ transplant, ?chronic steroids) ?Dolores Frame, RN, Celeste 03/31/2022 10:13:12 ?AM ?PLEASE NOTE: All timestamps contained within this report are represented as [place] Standard Time. ?CONFIDENTIALTY NOTICE: This fax transmission is intended only for the addressee. It contains information that is legally privileged, confidential or ?otherwise protected from use or disclosure. If you are not the intended recipient, you are strictly prohibited from reviewing, disclosing, copying using ?or disseminating any of this information or taking any action in reliance on or regarding this information. If you have received this fax in error, please ?notify us immediately by telephone so that we can arrange for its return to [place]. Phone: [phone], Toll-Free: [phone], Fax: [phone] ?Page: 2 of 2 ?Call Id: [id number] ?Disp. Time (Eastern ?Time) Disposition Final User ?03/31/2022 10:17:49 AM See HCP within 4 Hours (or ?PCP triage) ?Yes Pitre, RN, Celeste ?Caller Disagree/Comply Comply ?Caller Understands  Yes ?PreDisposition Home Care ?Care Advice Given Per Guideline ?SEE HCP (OR PCP TRIAGE) WITHIN 4 HOURS: * IF OFFICE WILL BE CLOSED AND NO PCP (PRIMARY CARE ?PROVIDER) SECOND-LEVEL TRIAGE: You need to be seen within the next 3 or 4 hours. A nearby Urgent Care Center [facility]) is ?often a good source of care. Another choice is to go to the ED. Go sooner if you become worse. FEVER MEDICINES: * For fevers ?above 101? F (38.3? C) take either acetaminophen or ibuprofen. CALL BACK IF: * You become worse CARE ADVICE given per ?Immunization Reactions (Adult) guideline. ?Referrals ?GO TO FACILITY UNDECIDED ?

## 2022-04-12 ENCOUNTER — Ambulatory Visit (INDEPENDENT_AMBULATORY_CARE_PROVIDER_SITE_OTHER): Payer: Federal, State, Local not specified - PPO

## 2022-04-12 DIAGNOSIS — Z23 Encounter for immunization: Secondary | ICD-10-CM

## 2022-04-12 NOTE — Progress Notes (Signed)
Patient here for Tdap as indicated on ov note from 03-30-22 ?

## 2022-05-01 ENCOUNTER — Telehealth: Payer: Self-pay | Admitting: Family Medicine

## 2022-05-01 NOTE — Telephone Encounter (Signed)
Pt called stating she has an imprint of the bandaid on her skin where she had gotten her tdap shot about 3 weeks ago. She also stated that she has tried an assortment of things to try and remove it including, isopropyl alcohol and coconut oil. Pt would like advise to remove it. Please Advise. ?

## 2022-05-01 NOTE — Telephone Encounter (Signed)
Pt called and was advised.

## 2022-08-10 ENCOUNTER — Other Ambulatory Visit: Payer: Self-pay | Admitting: Family Medicine

## 2022-08-10 DIAGNOSIS — I10 Essential (primary) hypertension: Secondary | ICD-10-CM

## 2022-09-21 ENCOUNTER — Other Ambulatory Visit: Payer: Self-pay | Admitting: Family Medicine

## 2022-09-21 DIAGNOSIS — Z1231 Encounter for screening mammogram for malignant neoplasm of breast: Secondary | ICD-10-CM

## 2022-10-01 ENCOUNTER — Encounter: Payer: Self-pay | Admitting: Family Medicine

## 2022-10-01 ENCOUNTER — Ambulatory Visit: Payer: Federal, State, Local not specified - PPO | Admitting: Family Medicine

## 2022-10-01 VITALS — BP 120/70 | HR 94 | Temp 97.7°F | Resp 18 | Ht 64.0 in | Wt 151.4 lb

## 2022-10-01 DIAGNOSIS — E1165 Type 2 diabetes mellitus with hyperglycemia: Secondary | ICD-10-CM | POA: Diagnosis not present

## 2022-10-01 DIAGNOSIS — Z794 Long term (current) use of insulin: Secondary | ICD-10-CM | POA: Diagnosis not present

## 2022-10-01 DIAGNOSIS — I10 Essential (primary) hypertension: Secondary | ICD-10-CM | POA: Diagnosis not present

## 2022-10-01 DIAGNOSIS — E1169 Type 2 diabetes mellitus with other specified complication: Secondary | ICD-10-CM

## 2022-10-01 DIAGNOSIS — E785 Hyperlipidemia, unspecified: Secondary | ICD-10-CM | POA: Diagnosis not present

## 2022-10-01 DIAGNOSIS — Z1211 Encounter for screening for malignant neoplasm of colon: Secondary | ICD-10-CM

## 2022-10-01 LAB — COMPREHENSIVE METABOLIC PANEL
ALT: 10 U/L (ref 0–35)
AST: 12 U/L (ref 0–37)
Albumin: 4.2 g/dL (ref 3.5–5.2)
Alkaline Phosphatase: 93 U/L (ref 39–117)
BUN: 10 mg/dL (ref 6–23)
CO2: 29 mEq/L (ref 19–32)
Calcium: 9.8 mg/dL (ref 8.4–10.5)
Chloride: 98 mEq/L (ref 96–112)
Creatinine, Ser: 1.05 mg/dL (ref 0.40–1.20)
GFR: 59.35 mL/min — ABNORMAL LOW (ref >60.00)
Glucose, Bld: 276 mg/dL — ABNORMAL HIGH (ref 70–99)
Potassium: 4.3 mEq/L (ref 3.5–5.1)
Sodium: 136 mEq/L (ref 135–145)
Total Bilirubin: 0.5 mg/dL (ref 0.2–1.2)
Total Protein: 7.1 g/dL (ref 6.0–8.3)

## 2022-10-01 LAB — CBC WITH DIFFERENTIAL/PLATELET
Basophils Absolute: 0 10*3/uL (ref 0.0–0.1)
Basophils Relative: 0.9 % (ref 0.0–3.0)
Eosinophils Absolute: 0.1 10*3/uL (ref 0.0–0.7)
Eosinophils Relative: 2.4 % (ref 0.0–5.0)
HCT: 39.7 % (ref 36.0–46.0)
Hemoglobin: 13 g/dL (ref 12.0–15.0)
Lymphocytes Relative: 39.2 % (ref 12.0–46.0)
Lymphs Abs: 1.7 10*3/uL (ref 0.7–4.0)
MCHC: 32.7 g/dL (ref 30.0–36.0)
MCV: 76.8 fl — ABNORMAL LOW (ref 78.0–100.0)
Monocytes Absolute: 0.3 10*3/uL (ref 0.1–1.0)
Monocytes Relative: 8.2 % (ref 3.0–12.0)
Neutro Abs: 2.1 10*3/uL (ref 1.4–7.7)
Neutrophils Relative %: 49.3 % (ref 43.0–77.0)
Platelets: 215 10*3/uL (ref 150.0–400.0)
RBC: 5.18 Mil/uL — ABNORMAL HIGH (ref 3.87–5.11)
RDW: 14.3 % (ref 11.5–15.5)
WBC: 4.3 10*3/uL (ref 4.0–10.5)

## 2022-10-01 LAB — LIPID PANEL
Cholesterol: 267 mg/dL — ABNORMAL HIGH (ref 0–200)
HDL: 53.3 mg/dL (ref >39.00)
LDL Cholesterol: 189 mg/dL — ABNORMAL HIGH (ref 0–99)
NonHDL: 213.68
Total CHOL/HDL Ratio: 5
Triglycerides: 121 mg/dL (ref 0.0–149.0)
VLDL: 24.2 mg/dL (ref 0.0–40.0)

## 2022-10-01 LAB — MICROALBUMIN / CREATININE URINE RATIO
Creatinine,U: 194.3 mg/dL
Microalb Creat Ratio: 0.9 mg/g (ref 0.0–30.0)
Microalb, Ur: 1.7 mg/dL (ref 0.0–1.9)

## 2022-10-01 LAB — HEMOGLOBIN A1C: Hgb A1c MFr Bld: 13.1 % — ABNORMAL HIGH (ref 4.6–6.5)

## 2022-10-01 MED ORDER — ROSUVASTATIN CALCIUM 40 MG PO TABS: 40.0000 mg | ORAL_TABLET | Freq: Every day | ORAL | 1 refills | Status: DC

## 2022-10-01 MED ORDER — BD PEN NEEDLE MINI U/F 31G X 5 MM MISC: 2 refills | Status: AC

## 2022-10-01 NOTE — Assessment & Plan Note (Signed)
Tolerating statin, encouraged heart healthy diet, avoid trans fats, minimize simple carbs and saturated fats. Increase exercise as tolerated 

## 2022-10-01 NOTE — Assessment & Plan Note (Signed)
Well controlled, no changes to meds. Encouraged heart healthy diet such as the DASH diet and exercise as tolerated.  °

## 2022-10-01 NOTE — Assessment & Plan Note (Signed)
Per endo She needs f/u

## 2022-10-01 NOTE — Progress Notes (Addendum)
Subjective:   By signing my name below, I, Paige Fleming, attest that this documentation has been prepared under the direction and in the presence of Paige Fleming 10/01/2022    Patient ID: Paige Fleming, female    DOB: 10/30/1966, 56 y.o.   MRN: [id number]  Chief Complaint  Patient presents with   Hyperlipidemia   Diabetes   Hypertension   Follow-up    HPI Patient is in today for office visit  She is requesting refills on her Crestor 40 mg prescription and mini pen needles 31G x 25mm.  She complains of a moving fatty tissue or organ on her left abdomen. She reports that it happened twice or three times during the summer where she would get up, stretch and feel a popping sensation on her left abdomen  She complains of a lump on her left shin.   She reports that her blood sugar is okay. She removed her Elenor Legato due to the noises the machine would emit while she was at work. She is now [facility].  She has not received a colonoscopy and is requesting to use cologuard.   Patient is not interested in receiving influenza vaccine.   She is requesting a doctor's note for today's visit.   She is currently walking and reports losing weight Wt Readings from Last 3 Encounters:  10/01/22 151 lb 6.4 oz (68.7 kg)  03/30/22 160 lb 12.8 oz (72.9 kg)  09/11/21 164 lb 6.4 oz (74.6 kg)   Past Medical History:  Diagnosis Date   Abortion history 1995   G3P2012   Allergic rhinitis    Asthma    Diabetes mellitus type II    Gestational diabetes mellitus 2001   Hyperlipidemia    Hypertension     Past Surgical History:  Procedure Laterality Date   CERVICAL BIOPSY  2000   TUBAL LIGATION  2001    Family History  Problem Relation Age of Onset   Hypertension Mother    Arthritis Mother    Breast cancer Mother 67   Cancer Mother        breast   Diabetes Mother    Hyperlipidemia Mother    Diabetes Father    Hyperlipidemia Father    Crohn's disease Sister        twin    Hypertension Maternal Aunt    Breast cancer Maternal Aunt    Diabetes Paternal Aunt    Hypertension Maternal Grandmother    Arthritis Maternal Grandmother    Heart disease Maternal Grandfather 26       mi   Breast cancer Cousin    Asthma Other        children    Social History   Socioeconomic History   Marital status: Legally Separated    Spouse name: Not on file   Number of children: 2   Years of education: Not on file   Highest education level: Not on file  Occupational History   Occupation: CLIENT SER REP    Employer: [employer]   Occupation: Armed forces training and education officer    Comment: [employer]  Tobacco Use   Smoking status: Never   Smokeless tobacco: Never  Substance and Sexual Activity   Alcohol use: No    Comment: rare   Drug use: No   Sexual activity: Yes    Partners: Male  Other Topics Concern   Not on file  Social History Narrative   Exercise-- walks in am   Social Determinants of Health  Financial Resource Strain: Not on file  Food Insecurity: Not on file  Transportation Needs: Not on file  Physical Activity: Not on file  Stress: Not on file  Social Connections: Not on file  Intimate Partner Violence: Not on file    Outpatient Medications Prior to Visit  Medication Sig Dispense Refill   azelastine (OPTIVAR) 0.05 % ophthalmic solution INSTILL 1 DROP INTO AFFECTED EYE TWICE A DAY 6 mL 6   Azelastine HCl (ASTEPRO) 0.15 % SOLN Place 2 sprays into both nostrils daily. 30 mL 11   ciclopirox (PENLAC) 8 % solution Apply topically daily.     Cyanocobalamin (B12 LIQUID HEALTH BOOSTER PO) Take by mouth.     EPINEPHrine 0.3 mg/0.3 mL IJ SOAJ injection AS DIRECTED FOR SYSTEMIC REACTIONS INJECTION 30 DAYS 1 each 1   fenofibrate 160 MG tablet Take 1 tablet (160 mg total) by mouth daily. 30 tablet 2   glucose blood test strip Use as instructed 100 each 12   insulin aspart protamine- aspart (NOVOLOG MIX 70/30) (70-30) 100 UNIT/ML injection Inject 30 Units into the skin  2 (two) times daily.     levocetirizine (XYZAL) 5 MG tablet Take 5 mg by mouth every evening.     lisinopril-hydrochlorothiazide (ZESTORETIC) 20-12.5 MG tablet TAKE 2 TABLETS BY MOUTH DAILY 180 tablet 1   ONETOUCH DELICA LANCETS MISC Check BS daily 100 each 1   B-D UF III MINI PEN NEEDLES 31G X 5 MM MISC USE DAILY WITH NOVOLOG 100 each 2   rosuvastatin (CRESTOR) 40 MG tablet Take 1 tablet (40 mg total) by mouth daily. 90 tablet 1   No facility-administered medications prior to visit.    No Known Allergies  Review of Systems  Constitutional:  Negative for fever and malaise/fatigue.  HENT:  Negative for congestion.   Eyes:  Negative for blurred vision.  Respiratory:  Negative for shortness of breath.   Cardiovascular:  Negative for chest pain, palpitations and leg swelling.  Gastrointestinal:  Negative for abdominal pain, blood in stool and nausea.  Genitourinary:  Negative for dysuria and frequency.  Musculoskeletal:  Negative for falls.       (+) Moving Tissue/Organ in Left Abdomen (+) Lump Left Shin  Skin:  Negative for rash.  Neurological:  Negative for dizziness, loss of consciousness and headaches.  Endo/Heme/Allergies:  Negative for environmental allergies.  Psychiatric/Behavioral:  Negative for depression. The patient is not nervous/anxious.        Objective:    Physical Exam Vitals and nursing note reviewed.  Constitutional:      General: She is not in acute distress.    Appearance: Normal appearance. She is well-developed. She is not ill-appearing.  HENT:     Head: Normocephalic and atraumatic.     Right Ear: External ear normal.     Left Ear: External ear normal.  Eyes:     Extraocular Movements: Extraocular movements intact.     Conjunctiva/sclera: Conjunctivae normal.     Pupils: Pupils are equal, round, and reactive to light.  Neck:     Thyroid: No thyromegaly.     Vascular: No carotid bruit or JVD.  Cardiovascular:     Rate and Rhythm: Normal rate and  regular rhythm.     Pulses:          Dorsalis pedis pulses are 2+ on the right side and 2+ on the left side.       Posterior tibial pulses are 2+ on the right side and 2+ on the  left side.     Heart sounds: Normal heart sounds. No murmur heard.    No gallop.  Pulmonary:     Effort: Pulmonary effort is normal. No respiratory distress.     Breath sounds: Normal breath sounds. No wheezing or rales.  Chest:     Chest wall: No tenderness.  Abdominal:     General: Bowel sounds are normal. There is no distension.     Palpations: Abdomen is soft.     Tenderness: There is no abdominal tenderness. There is no guarding.  Musculoskeletal:     Cervical back: Normal range of motion and neck supple.  Skin:    General: Skin is warm and dry.  Neurological:     Mental Status: She is alert and oriented to person, place, and time.  Psychiatric:        Judgment: Judgment normal.     BP 120/70 (BP Location: Left Arm, Patient Position: Sitting, Cuff Size: Normal)   Pulse 94   Temp 97.7 F (36.5 C) (Oral)   Resp 18   Ht 5\' 4"  (1.626 m)   Wt 151 lb 6.4 oz (68.7 kg)   LMP 02/17/2018   SpO2 99%   BMI 25.99 kg/m  Wt Readings from Last 3 Encounters:  10/01/22 151 lb 6.4 oz (68.7 kg)  03/30/22 160 lb 12.8 oz (72.9 kg)  09/11/21 164 lb 6.4 oz (74.6 kg)    Diabetic Foot Exam - Simple   Simple Foot Form Diabetic Foot exam was performed with the following findings: Yes 10/01/2022 12:43 PM  Visual Inspection No deformities, no ulcerations, no other skin breakdown bilaterally: Yes Sensation Testing Intact to touch and monofilament testing bilaterally: Yes Pulse Check Posterior Tibialis and Dorsalis pulse intact bilaterally: Yes Comments    Lab Results  Component Value Date   WBC 5.2 03/30/2022   HGB 12.9 03/30/2022   HCT 39.5 03/30/2022   PLT 223.0 03/30/2022   GLUCOSE 165 (H) 03/30/2022   CHOL 153 03/30/2022   TRIG 74.0 03/30/2022   HDL 54.10 03/30/2022   LDLDIRECT 152.3 07/13/2013    LDLCALC 84 03/30/2022   ALT 16 03/30/2022   AST 16 03/30/2022   NA 138 03/30/2022   K 4.6 03/30/2022   CL 99 03/30/2022   CREATININE 1.01 03/30/2022   BUN 17 03/30/2022   CO2 32 03/30/2022   TSH 2.16 03/30/2022   HGBA1C 14.7 (H) 11/17/2018   MICROALBUR 1.2 11/17/2018    Lab Results  Component Value Date   TSH 2.16 03/30/2022   Lab Results  Component Value Date   WBC 5.2 03/30/2022   HGB 12.9 03/30/2022   HCT 39.5 03/30/2022   MCV 77.3 (L) 03/30/2022   PLT 223.0 03/30/2022   Lab Results  Component Value Date   NA 138 03/30/2022   K 4.6 03/30/2022   CO2 32 03/30/2022   GLUCOSE 165 (H) 03/30/2022   BUN 17 03/30/2022   CREATININE 1.01 03/30/2022   BILITOT 0.4 03/30/2022   ALKPHOS 83 03/30/2022   AST 16 03/30/2022   ALT 16 03/30/2022   PROT 7.2 03/30/2022   ALBUMIN 4.5 03/30/2022   CALCIUM 10.2 03/30/2022   GFR 62.41 03/30/2022   Lab Results  Component Value Date   CHOL 153 03/30/2022   Lab Results  Component Value Date   HDL 54.10 03/30/2022   Lab Results  Component Value Date   LDLCALC 84 03/30/2022   Lab Results  Component Value Date   TRIG 74.0 03/30/2022   Lab  Results  Component Value Date   CHOLHDL 3 03/30/2022   Lab Results  Component Value Date   HGBA1C 14.7 (H) 11/17/2018       Assessment & Plan:   Problem List Items Addressed This Visit       Unprioritized   Hyperlipidemia LDL goal <100   Relevant Medications   rosuvastatin (CRESTOR) 40 MG tablet   Other Relevant Orders   Comprehensive metabolic panel   Lipid panel   Type 2 diabetes mellitus with hyperglycemia, with long-term current use of insulin (HCC)    Per endo She needs f/u       Relevant Medications   rosuvastatin (CRESTOR) 40 MG tablet   Insulin Pen Needle (B-D UF III MINI PEN NEEDLES) 31G X 5 MM MISC   Other Relevant Orders   Comprehensive metabolic panel   Hemoglobin A1c   Microalbumin / creatinine urine ratio   Hyperlipidemia associated with type 2 diabetes  mellitus (HCC)    Tolerating statin, encouraged heart healthy diet, avoid trans fats, minimize simple carbs and saturated fats. Increase exercise as tolerated      Relevant Medications   rosuvastatin (CRESTOR) 40 MG tablet   Other Relevant Orders   CBC with Differential/Platelet   Lipid panel   Microalbumin / creatinine urine ratio   Essential hypertension    Well controlled, no changes to meds. Encouraged heart healthy diet such as the DASH diet and exercise as tolerated.       Relevant Medications   rosuvastatin (CRESTOR) 40 MG tablet   Other Relevant Orders   CBC with Differential/Platelet   Comprehensive metabolic panel   Hemoglobin A1c   Lipid panel   Other Visit Diagnoses     Colon cancer screening    -  Primary   Relevant Orders   Cologuard      Meds ordered this encounter  Medications   rosuvastatin (CRESTOR) 40 MG tablet    Sig: Take 1 tablet (40 mg total) by mouth daily.    Dispense:  90 tablet    Refill:  1   Insulin Pen Needle (B-D UF III MINI PEN NEEDLES) 31G X 5 MM MISC    Sig: As directed    Dispense:  100 each    Refill:  2    I, Donato Schultz, Fleming, personally preformed the services described in this documentation.  All medical record entries made by the scribe were at my direction and in my presence.  I have reviewed the chart and discharge instructions (if applicable) and agree that the record reflects my personal performance and is accurate and complete. 10/01/2022   I,Amber Collins,acting as a scribe for Donato Schultz, Fleming.,have documented all relevant documentation on the behalf of Donato Schultz, Fleming,as directed by  Donato Schultz, Fleming while in the presence of Donato Schultz, Fleming.    Donato Schultz, Fleming

## 2022-10-17 ENCOUNTER — Ambulatory Visit
Admission: RE | Admit: 2022-10-17 | Discharge: 2022-10-17 | Disposition: A | Discharge status: Home / Self Care | Payer: Federal, State, Local not specified - PPO | Source: Ambulatory Visit | Attending: Family Medicine | Admitting: Family Medicine

## 2022-10-17 DIAGNOSIS — Z1231 Encounter for screening mammogram for malignant neoplasm of breast: Secondary | ICD-10-CM

## 2022-10-19 ENCOUNTER — Other Ambulatory Visit: Payer: Self-pay | Admitting: Family Medicine

## 2022-10-19 DIAGNOSIS — R928 Other abnormal and inconclusive findings on diagnostic imaging of breast: Secondary | ICD-10-CM

## 2022-10-24 DIAGNOSIS — Z6827 Body mass index (BMI) 27.0-27.9, adult: Secondary | ICD-10-CM | POA: Diagnosis not present

## 2022-10-24 DIAGNOSIS — Z01419 Encounter for gynecological examination (general) (routine) without abnormal findings: Secondary | ICD-10-CM | POA: Diagnosis not present

## 2022-10-26 DIAGNOSIS — Z01411 Encounter for gynecological examination (general) (routine) with abnormal findings: Secondary | ICD-10-CM | POA: Diagnosis not present

## 2022-10-26 DIAGNOSIS — Z124 Encounter for screening for malignant neoplasm of cervix: Secondary | ICD-10-CM | POA: Diagnosis not present

## 2022-10-26 DIAGNOSIS — Z01419 Encounter for gynecological examination (general) (routine) without abnormal findings: Secondary | ICD-10-CM | POA: Diagnosis not present

## 2022-10-26 DIAGNOSIS — Z113 Encounter for screening for infections with a predominantly sexual mode of transmission: Secondary | ICD-10-CM | POA: Diagnosis not present

## 2022-10-31 ENCOUNTER — Ambulatory Visit
Admission: RE | Admit: 2022-10-31 | Discharge: 2022-10-31 | Disposition: A | Discharge status: Home / Self Care | Payer: Federal, State, Local not specified - PPO | Source: Ambulatory Visit | Attending: Family Medicine | Admitting: Family Medicine

## 2022-10-31 ENCOUNTER — Ambulatory Visit: Admission: RE | Admit: 2022-10-31 | Payer: Federal, State, Local not specified - PPO | Source: Ambulatory Visit

## 2022-10-31 DIAGNOSIS — R92322 Mammographic fibroglandular density, left breast: Secondary | ICD-10-CM | POA: Diagnosis not present

## 2022-10-31 DIAGNOSIS — R928 Other abnormal and inconclusive findings on diagnostic imaging of breast: Secondary | ICD-10-CM

## 2022-11-14 LAB — COLOGUARD: COLOGUARD: NEGATIVE

## 2022-12-05 DIAGNOSIS — E119 Type 2 diabetes mellitus without complications: Secondary | ICD-10-CM | POA: Diagnosis not present

## 2023-02-14 DIAGNOSIS — Z118 Encounter for screening for other infectious and parasitic diseases: Secondary | ICD-10-CM | POA: Diagnosis not present

## 2023-02-14 DIAGNOSIS — N898 Other specified noninflammatory disorders of vagina: Secondary | ICD-10-CM | POA: Diagnosis not present

## 2023-02-14 DIAGNOSIS — N762 Acute vulvitis: Secondary | ICD-10-CM | POA: Diagnosis not present

## 2023-03-08 ENCOUNTER — Other Ambulatory Visit: Payer: Self-pay | Admitting: Family Medicine

## 2023-03-08 DIAGNOSIS — I10 Essential (primary) hypertension: Secondary | ICD-10-CM

## 2023-04-04 ENCOUNTER — Encounter: Payer: Federal, State, Local not specified - PPO | Admitting: Family Medicine

## 2023-04-16 ENCOUNTER — Ambulatory Visit (INDEPENDENT_AMBULATORY_CARE_PROVIDER_SITE_OTHER): Payer: Federal, State, Local not specified - PPO | Admitting: Family Medicine

## 2023-04-16 ENCOUNTER — Encounter: Payer: Self-pay | Admitting: Family Medicine

## 2023-04-16 VITALS — BP 130/84 | HR 84 | Temp 98.0°F | Resp 16 | Ht 64.0 in | Wt 154.2 lb

## 2023-04-16 DIAGNOSIS — E1169 Type 2 diabetes mellitus with other specified complication: Secondary | ICD-10-CM

## 2023-04-16 DIAGNOSIS — E1165 Type 2 diabetes mellitus with hyperglycemia: Secondary | ICD-10-CM | POA: Diagnosis not present

## 2023-04-16 DIAGNOSIS — C189 Malignant neoplasm of colon, unspecified: Secondary | ICD-10-CM

## 2023-04-16 DIAGNOSIS — I10 Essential (primary) hypertension: Secondary | ICD-10-CM | POA: Diagnosis not present

## 2023-04-16 DIAGNOSIS — L509 Urticaria, unspecified: Secondary | ICD-10-CM

## 2023-04-16 DIAGNOSIS — E538 Deficiency of other specified B group vitamins: Secondary | ICD-10-CM | POA: Diagnosis not present

## 2023-04-16 DIAGNOSIS — Z1211 Encounter for screening for malignant neoplasm of colon: Secondary | ICD-10-CM

## 2023-04-16 DIAGNOSIS — Z794 Long term (current) use of insulin: Secondary | ICD-10-CM | POA: Diagnosis not present

## 2023-04-16 DIAGNOSIS — E785 Hyperlipidemia, unspecified: Secondary | ICD-10-CM | POA: Diagnosis not present

## 2023-04-16 DIAGNOSIS — T7840XD Allergy, unspecified, subsequent encounter: Secondary | ICD-10-CM

## 2023-04-16 DIAGNOSIS — Z0000 Encounter for general adult medical examination without abnormal findings: Secondary | ICD-10-CM | POA: Diagnosis not present

## 2023-04-16 LAB — CBC WITH DIFFERENTIAL/PLATELET
Basophils Absolute: 0 10*3/uL (ref 0.0–0.1)
Basophils Relative: 0.9 % (ref 0.0–3.0)
Eosinophils Absolute: 0.1 10*3/uL (ref 0.0–0.7)
Eosinophils Relative: 2 % (ref 0.0–5.0)
HCT: 43.6 % (ref 36.0–46.0)
Hemoglobin: 14.1 g/dL (ref 12.0–15.0)
Lymphocytes Relative: 38.8 % (ref 12.0–46.0)
Lymphs Abs: 2.1 10*3/uL (ref 0.7–4.0)
MCHC: 32.4 g/dL (ref 30.0–36.0)
MCV: 77.7 fl — ABNORMAL LOW (ref 78.0–100.0)
Monocytes Absolute: 0.4 10*3/uL (ref 0.1–1.0)
Monocytes Relative: 8.3 % (ref 3.0–12.0)
Neutro Abs: 2.7 10*3/uL (ref 1.4–7.7)
Neutrophils Relative %: 50 % (ref 43.0–77.0)
Platelets: 246 10*3/uL (ref 150.0–400.0)
RBC: 5.61 Mil/uL — ABNORMAL HIGH (ref 3.87–5.11)
RDW: 13.8 % (ref 11.5–15.5)
WBC: 5.4 10*3/uL (ref 4.0–10.5)

## 2023-04-16 LAB — COMPREHENSIVE METABOLIC PANEL
ALT: 15 U/L (ref 0–35)
AST: 16 U/L (ref 0–37)
Albumin: 4.4 g/dL (ref 3.5–5.2)
Alkaline Phosphatase: 94 U/L (ref 39–117)
BUN: 16 mg/dL (ref 6–23)
CO2: 31 mEq/L (ref 19–32)
Calcium: 9.8 mg/dL (ref 8.4–10.5)
Chloride: 100 mEq/L (ref 96–112)
Creatinine, Ser: 0.99 mg/dL (ref 0.40–1.20)
GFR: 63.45 mL/min (ref >60.00)
Glucose, Bld: 121 mg/dL — ABNORMAL HIGH (ref 70–99)
Potassium: 3.9 mEq/L (ref 3.5–5.1)
Sodium: 140 mEq/L (ref 135–145)
Total Bilirubin: 0.4 mg/dL (ref 0.2–1.2)
Total Protein: 7.3 g/dL (ref 6.0–8.3)

## 2023-04-16 LAB — MICROALBUMIN / CREATININE URINE RATIO
Creatinine,U: 245.8 mg/dL
Microalb Creat Ratio: 1.9 mg/g (ref 0.0–30.0)
Microalb, Ur: 4.6 mg/dL — ABNORMAL HIGH (ref 0.0–1.9)

## 2023-04-16 LAB — LIPID PANEL
Cholesterol: 311 mg/dL — ABNORMAL HIGH (ref 0–200)
HDL: 70.6 mg/dL (ref >39.00)
LDL Cholesterol: 221 mg/dL — ABNORMAL HIGH (ref 0–99)
NonHDL: 240.27
Total CHOL/HDL Ratio: 4
Triglycerides: 98 mg/dL (ref 0.0–149.0)
VLDL: 19.6 mg/dL (ref 0.0–40.0)

## 2023-04-16 LAB — HEMOGLOBIN A1C: Hgb A1c MFr Bld: 13.4 % — ABNORMAL HIGH (ref 4.6–6.5)

## 2023-04-16 LAB — VITAMIN B12: Vitamin B-12: 558 pg/mL (ref 211–911)

## 2023-04-16 MED ORDER — MOMETASONE FUROATE 0.1 % EX CREA
TOPICAL_CREAM | CUTANEOUS | 3 refills | Status: AC
Start: 2023-04-16 — End: ?

## 2023-04-16 MED ORDER — EPINEPHRINE 0.3 MG/0.3ML IJ SOAJ
INTRAMUSCULAR | 1 refills | Status: AC
Start: 2023-04-16 — End: ?

## 2023-04-16 NOTE — Progress Notes (Signed)
Subjective:   By signing my name below, I, Shehryar Baig, attest that this documentation has been prepared under the direction and in the presence of Donato Schultz, DO. 04/16/2023   Patient ID: Paige Fleming, female    DOB: [birth year], 57 y.o.   MRN: [id number]  Chief Complaint  Patient presents with  . Annual Exam    Fasting    HPI Patient is in today for a comprehensive physical exam.   She is requesting a refill for her Epi-pen and 0.1% mometasone cream.  She denies fever, new moles, congestion, sinus pain, sore throat, chest pain, palpitations, cough, shortness of breath, wheezing, nausea, vomiting, abdominal pain, diarrhea, constipation, dysuria, frequency, hematuria, new muscle pain, new joint pain, or headaches at this time.  Her cousin passed away from cancer recently, her father is alive at 28 years old, her mother passed away from breast cancer at age 53, otherwise she has no changes to her family medical history.  Mammogram was last completed 10/31/2022. Results are normal. Repeat in 1 year.  She reports being UTD on pap smear.  She is due for colonoscopy.  She is UTD on vision care.    Past Medical History:  Diagnosis Date  . Abortion history 1995   G3P2012  . Allergic rhinitis   . Asthma   . Diabetes mellitus type II   . Gestational diabetes mellitus 2001  . Hyperlipidemia   . Hypertension     Past Surgical History:  Procedure Laterality Date  . CERVICAL BIOPSY  2000  . TUBAL LIGATION  2001    Family History  Problem Relation Age of Onset  . Hypertension Mother   . Arthritis Mother   . Breast cancer Mother 34  . Cancer Mother        breast  . Diabetes Mother   . Hyperlipidemia Mother   . Diabetes Father   . Hyperlipidemia Father   . Crohn's disease Sister        twin  . Hypertension Maternal Grandmother   . Arthritis Maternal Grandmother   . Heart disease Maternal Grandfather 13       mi  . Hypertension Maternal Aunt   .  Breast cancer Maternal Aunt   . Diabetes Paternal Aunt   . Breast cancer Cousin   . Cancer Cousin   . Asthma Other        children    Social History   Socioeconomic History  . Marital status: Legally Separated    Spouse name: Not on file  . Number of children: 2  . Years of education: Not on file  . Highest education level: Not on file  Occupational History  . Occupation: CLIENT SER REP    Employer: [employer]  . Occupation: Optician, dispensing    Comment: [employer]  Tobacco Use  . Smoking status: Never  . Smokeless tobacco: Never  Substance and Sexual Activity  . Alcohol use: No    Comment: rare  . Drug use: No  . Sexual activity: Yes    Partners: Male  Other Topics Concern  . Not on file  Social History Narrative   Exercise-- walks in am   Social Determinants of Health   Financial Resource Strain: Not on file  Food Insecurity: Not on file  Transportation Needs: Not on file  Physical Activity: Not on file  Stress: Not on file  Social Connections: Not on file  Intimate Partner Violence: Not on file    Outpatient Medications Prior  to Visit  Medication Sig Dispense Refill  . azelastine (OPTIVAR) 0.05 % ophthalmic solution INSTILL 1 DROP INTO AFFECTED EYE TWICE A DAY 6 mL 6  . Azelastine HCl (ASTEPRO) 0.15 % SOLN Place 2 sprays into both nostrils daily. 30 mL 11  . ciclopirox (PENLAC) 8 % solution Apply topically daily.    . Cyanocobalamin (B12 LIQUID HEALTH BOOSTER PO) Take by mouth.    Marland Kitchen glucose blood test strip Use as instructed 100 each 12  . insulin aspart protamine- aspart (NOVOLOG MIX 70/30) (70-30) 100 UNIT/ML injection Inject 30 Units into the skin 2 (two) times daily.    . Insulin Pen Needle (B-D UF III MINI PEN NEEDLES) 31G X 5 MM MISC As directed 100 each 2  . levocetirizine (XYZAL) 5 MG tablet Take 5 mg by mouth every evening.    Marland Kitchen lisinopril-hydrochlorothiazide (ZESTORETIC) 20-12.5 MG tablet TAKE 2 TABLETS BY MOUTH EVERY DAY 180 tablet 0  .  ONETOUCH DELICA LANCETS MISC Check BS daily 100 each 1  . rosuvastatin (CRESTOR) 40 MG tablet Take 1 tablet (40 mg total) by mouth daily. 90 tablet 1  . fenofibrate 160 MG tablet Take 1 tablet (160 mg total) by mouth daily. (Patient not taking: Reported on 04/16/2023) 30 tablet 2  . EPINEPHrine 0.3 mg/0.3 mL IJ SOAJ injection AS DIRECTED FOR SYSTEMIC REACTIONS INJECTION 30 DAYS (Patient not taking: Reported on 04/16/2023) 1 each 1  . lisinopril (PRINIVIL,ZESTRIL) 20 MG tablet Take 1 tablet (20 mg total) by mouth daily. 30 tablet 2  . Saxagliptin-Metformin 04-999 MG TB24 Take 1 tablet by mouth every evening. 30 tablet 2   No facility-administered medications prior to visit.    No Known Allergies  Review of Systems  Constitutional:  Negative for fever.  HENT:  Negative for congestion, sinus pain and sore throat.   Respiratory:  Negative for cough, shortness of breath and wheezing.   Cardiovascular:  Negative for chest pain and palpitations.  Gastrointestinal:  Negative for abdominal pain, constipation, diarrhea, nausea and vomiting.  Genitourinary:  Negative for dysuria, frequency and hematuria.  Musculoskeletal:        (-)new muscle pain (-)new joint pain  Skin:        (-)New moles  Neurological:  Negative for headaches.       Objective:    Physical Exam Constitutional:      General: She is not in acute distress.    Appearance: Normal appearance. She is well-developed. She is not ill-appearing.  HENT:     Head: Normocephalic and atraumatic.     Right Ear: Tympanic membrane, ear canal and external ear normal.     Left Ear: Tympanic membrane, ear canal and external ear normal.     Nose: Nose normal.  Eyes:     Extraocular Movements: Extraocular movements intact.     Pupils: Pupils are equal, round, and reactive to light.  Cardiovascular:     Rate and Rhythm: Normal rate and regular rhythm.     Heart sounds: Normal heart sounds. No murmur heard.    No gallop.  Pulmonary:      Effort: Pulmonary effort is normal. No respiratory distress.     Breath sounds: Normal breath sounds. No wheezing or rales.  Chest:     Chest wall: No tenderness.  Abdominal:     General: Bowel sounds are normal. There is no distension.     Palpations: Abdomen is soft.     Tenderness: There is no abdominal tenderness. There is no  guarding.  Musculoskeletal:        General: Normal range of motion.     Cervical back: Normal range of motion and neck supple.  Skin:    General: Skin is warm and dry.  Neurological:     General: No focal deficit present.     Mental Status: She is alert and oriented to person, place, and time.  Psychiatric:        Mood and Affect: Mood normal.        Behavior: Behavior normal.        Thought Content: Thought content normal.        Judgment: Judgment normal.    BP 130/84   Pulse 84   Temp 98 F (36.7 C) (Oral)   Resp 16   Ht  (1.626 m)   Wt 154 lb 4 oz (70 kg)   LMP 02/17/2018   SpO2 98%   BMI 26.48 kg/m  Wt Readings from Last 3 Encounters:  04/16/23 154 lb 4 oz (70 kg)  10/01/22 151 lb 6.4 oz (68.7 kg)  03/30/22 160 lb 12.8 oz (72.9 kg)       Assessment & Plan:  Preventative health care Assessment & Plan: Ghm utd Check labs  See AVS Health Maintenance  Topic Date Due  . COLONOSCOPY (Pts 45-49yrs Insurance coverage will need to be confirmed)  Never done  . COVID-19 Vaccine (5 - 2023-24 season) 08/31/2022  . HEMOGLOBIN A1C  04/02/2023  . INFLUENZA VACCINE  08/01/2023  . Diabetic kidney evaluation - eGFR measurement  10/02/2023  . Diabetic kidney evaluation - Urine ACR  10/02/2023  . FOOT EXAM  10/02/2023  . MAMMOGRAM  10/18/2023  . OPHTHALMOLOGY EXAM  12/06/2023  . PAP SMEAR-Modifier  10/24/2025  . DTaP/Tdap/Td (4 - Td or Tdap) 04/12/2032  . Hepatitis C Screening  Completed  . HIV Screening  Completed  . Zoster Vaccines- Shingrix  Completed  . HPV VACCINES  Aged Out     Orders: -     Lipid panel -     CBC with  Differential/Platelet -     Comprehensive metabolic panel -     Hemoglobin A1c -     Microalbumin / creatinine urine ratio  Urticaria -     Mometasone Furoate; Appy bid  Dispense: 45 g; Refill: 3  Allergy, subsequent encounter -     EPINEPHrine; AS DIRECTED FOR SYSTEMIC REACTIONS INJECTION 30 DAYS  Dispense: 1 each; Refill: 1  Hyperlipidemia associated with type 2 diabetes mellitus Assessment & Plan: Tolerating statin, encouraged heart healthy diet, avoid trans fats, minimize simple carbs and saturated fats. Increase exercise as tolerated   Orders: -     Lipid panel -     Comprehensive metabolic panel -     Hemoglobin A1c -     Microalbumin / creatinine urine ratio  Type 2 diabetes mellitus with hyperglycemia, with long-term current use of insulin Assessment & Plan: hgba1c to be checked, minimize simple carbs. Increase exercise as tolerated. Continue current meds She will f/u with endo   Orders: -     CBC with Differential/Platelet -     Comprehensive metabolic panel -     Hemoglobin A1c  Essential hypertension -     Lipid panel -     CBC with Differential/Platelet -     Comprehensive metabolic panel  B12 deficiency -     Vitamin B12  Malignant neoplasm of colon, unspecified part of colon  Colon cancer screening -  Ambulatory referral to Gastroenterology    I, Donato Schultz, DO, personally preformed the services described in this documentation.  All medical record entries made by the scribe were at my direction and in my presence.  I have reviewed the chart and discharge instructions (if applicable) and agree that the record reflects my personal performance and is accurate and complete. 04/16/2023   I,Shehryar Baig,acting as a scribe for Donato Schultz, DO.,have documented all relevant documentation on the behalf of Donato Schultz, DO,as directed by  Donato Schultz, DO while in the presence of Donato Schultz, DO.   Donato Schultz, DO

## 2023-04-16 NOTE — Assessment & Plan Note (Signed)
hgba1c to be checked, minimize simple carbs. Increase exercise as tolerated. Continue current meds She will f/u with endo

## 2023-04-16 NOTE — Patient Instructions (Signed)
Preventive Care 57-57 Years Old, Female Preventive care refers to lifestyle choices and visits with your health care provider that can promote health and wellness. Preventive care visits are also called wellness exams. What can I expect for my preventive care visit? Counseling Your health care provider may ask you questions about your: Medical history, including: Past medical problems. Family medical history. Pregnancy history. Current health, including: Menstrual cycle. Method of birth control. Emotional well-being. Home life and relationship well-being. Sexual activity and sexual health. Lifestyle, including: Alcohol, nicotine or tobacco, and drug use. Access to firearms. Diet, exercise, and sleep habits. Work and work environment. Sunscreen use. Safety issues such as seatbelt and bike helmet use. Physical exam Your health care provider will check your: Height and weight. These may be used to calculate your BMI (body mass index). BMI is a measurement that tells if you are at a healthy weight. Waist circumference. This measures the distance around your waistline. This measurement also tells if you are at a healthy weight and may help predict your risk of certain diseases, such as type 2 diabetes and high blood pressure. Heart rate and blood pressure. Body temperature. Skin for abnormal spots. What immunizations do I need?  Vaccines are usually given at various ages, according to a schedule. Your health care provider will recommend vaccines for you based on your age, medical history, and lifestyle or other factors, such as travel or where you work. What tests do I need? Screening Your health care provider may recommend screening tests for certain conditions. This may include: Lipid and cholesterol levels. Diabetes screening. This is done by checking your blood sugar (glucose) after you have not eaten for a while (fasting). Pelvic exam and Pap test. Hepatitis B test. Hepatitis C  test. HIV (human immunodeficiency virus) test. STI (sexually transmitted infection) testing, if you are at risk. Lung cancer screening. Colorectal cancer screening. Mammogram. Talk with your health care provider about when you should start having regular mammograms. This may depend on whether you have a family history of breast cancer. BRCA-related cancer screening. This may be done if you have a family history of breast, ovarian, tubal, or peritoneal cancers. Bone density scan. This is done to screen for osteoporosis. Talk with your health care provider about your test results, treatment options, and if necessary, the need for more tests. Follow these instructions at home: Eating and drinking  Eat a diet that includes fresh fruits and vegetables, whole grains, lean protein, and low-fat dairy products. Take vitamin and mineral supplements as recommended by your health care provider. Do not drink alcohol if: Your health care provider tells you not to drink. You are pregnant, may be pregnant, or are planning to become pregnant. If you drink alcohol: Limit how much you have to 0-1 drink a day. Know how much alcohol is in your drink. In the [place] one drink equals one 12 oz bottle of beer (355 mL), one 5 oz glass of wine (148 mL), or one 1 oz glass of hard liquor (44 mL). Lifestyle Brush your teeth every morning and night with fluoride toothpaste. Floss one time each day. Exercise for at least 30 minutes 5 or more days each week. Do not use any products that contain nicotine or tobacco. These products include cigarettes, chewing tobacco, and vaping devices, such as e-cigarettes. If you need help quitting, ask your health care provider. Do not use drugs. If you are sexually active, practice safe sex. Use a condom or other form of protection to   prevent STIs. If you do not wish to become pregnant, use a form of birth control. If you plan to become pregnant, see your health care provider for a  prepregnancy visit. Take aspirin only as told by your health care provider. Make sure that you understand how much to take and what form to take. Work with your health care provider to find out whether it is safe and beneficial for you to take aspirin daily. Find healthy ways to manage stress, such as: Meditation, yoga, or listening to music. Journaling. Talking to a trusted person. Spending time with friends and family. Minimize exposure to UV radiation to reduce your risk of skin cancer. Safety Always wear your seat belt while driving or riding in a vehicle. Do not drive: If you have been drinking alcohol. Do not ride with someone who has been drinking. When you are tired or distracted. While texting. If you have been using any mind-altering substances or drugs. Wear a helmet and other protective equipment during sports activities. If you have firearms in your house, make sure you follow all gun safety procedures. Seek help if you have been physically or sexually abused. What's next? Visit your health care provider once a year for an annual wellness visit. Ask your health care provider how often you should have your eyes and teeth checked. Stay up to date on all vaccines. This information is not intended to replace advice given to you by your health care provider. Make sure you discuss any questions you have with your health care provider. Document Revised: 06/14/2021 Document Reviewed: 06/14/2021 Elsevier Patient Education  2023 Elsevier Inc.  

## 2023-04-16 NOTE — Assessment & Plan Note (Signed)
Tolerating statin, encouraged heart healthy diet, avoid trans fats, minimize simple carbs and saturated fats. Increase exercise as tolerated 

## 2023-04-16 NOTE — Assessment & Plan Note (Signed)
Ghm utd Check labs  See [facility] Health Maintenance  Topic Date Due   COLONOSCOPY (Pts 45-55yrs Insurance coverage will need to be confirmed)  Never done   COVID-19 Vaccine (5 - 2023-24 season) 08/31/2022   HEMOGLOBIN A1C  04/02/2023   INFLUENZA VACCINE  08/01/2023   Diabetic kidney evaluation - eGFR measurement  10/02/2023   Diabetic kidney evaluation - Urine ACR  10/02/2023   FOOT EXAM  10/02/2023   MAMMOGRAM  10/18/2023   OPHTHALMOLOGY EXAM  12/06/2023   PAP SMEAR-Modifier  10/24/2025   DTaP/Tdap/Td (4 - Td or Tdap) 04/12/2032   Hepatitis C Screening  Completed   HIV Screening  Completed   Zoster Vaccines- Shingrix  Completed   HPV VACCINES  Aged Out

## 2023-05-09 ENCOUNTER — Telehealth: Payer: Self-pay | Admitting: Family Medicine

## 2023-05-09 DIAGNOSIS — I10 Essential (primary) hypertension: Secondary | ICD-10-CM

## 2023-05-09 MED ORDER — LISINOPRIL-HYDROCHLOROTHIAZIDE 20-12.5 MG PO TABS
2.0000 | ORAL_TABLET | Freq: Every day | ORAL | 1 refills | Status: DC
Start: 2023-05-09 — End: 2023-10-17

## 2023-05-09 NOTE — Telephone Encounter (Signed)
Prescription Request  05/09/2023  Is this a "Controlled Substance" medicine? No  LOV: 04/16/2023  What is the name of the medication or equipment?  lisinopril-hydrochlorothiazide (ZESTORETIC) 20-12.5 MG tablet   Have you contacted your pharmacy to request a refill? No   Which pharmacy would you like this sent to?  [id number] [place], [place] - [address] [place] [address] Phone: [phone] Fax: [phone]    Patient notified that their request is being sent to the clinical staff for review and that they should receive a response within 2 business days.   Please advise at [facility] [phone]

## 2023-05-09 NOTE — Telephone Encounter (Signed)
Refill sent.

## 2023-06-05 ENCOUNTER — Telehealth: Payer: Self-pay | Admitting: Family Medicine

## 2023-06-05 NOTE — Telephone Encounter (Signed)
Pt called to advise that effective as of last week, she is back to working 68 hours/week and she has FMLA to be completed. She is going to try to fax it but is working 7-7:30 most days.

## 2023-06-05 NOTE — Telephone Encounter (Signed)
Will await paperwork.

## 2023-06-07 NOTE — Telephone Encounter (Signed)
Left message on machine to let patient know that I have not received any paperwork and to call me back or refax.

## 2023-06-07 NOTE — Telephone Encounter (Signed)
Spoke with patient and she has not yet faxed over any paperwork. Says she will send over before 6pm today.

## 2023-06-18 ENCOUNTER — Telehealth: Payer: Self-pay | Admitting: Family Medicine

## 2023-06-18 NOTE — Telephone Encounter (Signed)
Pt dropped off Certification of Health Care Provider for Employee's serios health condition paperwork. Pt does NOT want it to be faxed she wants to be called and then come and pick it up. Placed in PCP's tray.

## 2023-06-19 NOTE — Telephone Encounter (Signed)
Forms received.  

## 2023-06-26 NOTE — Telephone Encounter (Signed)
Pt dropped off fmla paperwork for L wrist? There is no office visit with her complaining about her wrist? She needs office visit --- at least virtual--- unless I'm missing something

## 2023-06-27 NOTE — Telephone Encounter (Signed)
Pt called. LDVM advising Lowne hasn't seen patient for this concern and to set up appointment.

## 2023-06-28 NOTE — Telephone Encounter (Signed)
Pt stated she got a call from our office but there was no vm so she was not sure what it was regarding.

## 2023-07-01 NOTE — Telephone Encounter (Signed)
noted 

## 2023-07-01 NOTE — Telephone Encounter (Signed)
Pt sched 7/5

## 2023-07-05 ENCOUNTER — Ambulatory Visit: Payer: Federal, State, Local not specified - PPO | Admitting: Family Medicine

## 2023-07-05 ENCOUNTER — Encounter: Payer: Self-pay | Admitting: Family Medicine

## 2023-07-05 VITALS — BP 134/68 | HR 102 | Temp 98.7°F | Resp 18 | Ht 64.0 in | Wt 148.2 lb

## 2023-07-05 DIAGNOSIS — E1165 Type 2 diabetes mellitus with hyperglycemia: Secondary | ICD-10-CM

## 2023-07-05 DIAGNOSIS — L700 Acne vulgaris: Secondary | ICD-10-CM | POA: Insufficient documentation

## 2023-07-05 DIAGNOSIS — Z794 Long term (current) use of insulin: Secondary | ICD-10-CM

## 2023-07-05 DIAGNOSIS — M25532 Pain in left wrist: Secondary | ICD-10-CM | POA: Diagnosis not present

## 2023-07-05 MED ORDER — CLINDAMYCIN PHOSPHATE 1 % EX LOTN
TOPICAL_LOTION | Freq: Two times a day (BID) | CUTANEOUS | 0 refills | Status: DC
Start: 2023-07-05 — End: 2023-09-09

## 2023-07-05 MED ORDER — INSULIN ASPART PROT & ASPART (70-30 MIX) 100 UNIT/ML ~~LOC~~ SUSP: 30.0000 [IU] | Freq: Two times a day (BID) | SUBCUTANEOUS | 1 refills | Status: DC

## 2023-07-05 NOTE — Progress Notes (Signed)
Established Patient Office Visit  Subjective   Patient ID: Paige Fleming, female    DOB: 09-24-1966  Age: 57 y.o. MRN: [id number]  Chief Complaint  Patient presents with   FMLA    HPI Discussed the use of AI scribe software for clinical note transcription with the patient, who gave verbal consent to proceed.  History of Present Illness   The patient, with a history of diabetes, presents with left wrist pain. 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 The patient has been managing the pain with over-the-counter Voltaren gel and ice, and has been wearing a wrist stabilizer for over a year. The patient has not seen an orthopedic specialist for this issue.  In addition to the wrist pain, the patient is also dealing with a recent personal loss. Their husband passed away suddenly from stage four kidney cancer that had metastasized to the bone. The patient is understandably distressed and reports difficulty sleeping.      Patient Active Problem List   Diagnosis Date Noted   Left wrist pain 07/05/2023   Acne vulgaris 07/05/2023   Preventative health care 03/30/2022   Type 2 diabetes mellitus with hyperglycemia, with long-term current use of insulin (HCC) 11/17/2018   Hyperlipidemia associated with type 2 diabetes mellitus (HCC) 11/17/2018   Hyperlipidemia LDL goal <100 05/05/2018   Cellulitis and abscess of digit 07/06/2013   Grief reaction 11/03/2012   CELLULITIS, FINGER 01/09/2011   URI 12/19/2010   Essential hypertension 09/21/2010   Diabetes mellitus type II, uncontrolled 06/19/2010   HYPERGLYCEMIA, FASTING 06/19/2010   POSTTRAUMATIC STRESS DISORDER 05/16/2010   COUGH 01/25/2009   ACUTE SINUSITIS, UNSPECIFIED 08/30/2008   ALLERGIC RHINITIS 08/27/2008   CONTUSION OF BUTTOCK 07/20/2008    Past Medical History:  Diagnosis Date   Abortion history 1995   [id number]   Allergic rhinitis    Asthma    Diabetes mellitus type II    Gestational diabetes mellitus 2001   Hyperlipidemia    Hypertension    Past Surgical History:  Procedure Laterality Date   CERVICAL BIOPSY  2000   TUBAL LIGATION  2001   Social History   Tobacco Use   Smoking status: Never   Smokeless tobacco: Never  Substance Use Topics   Alcohol use: No    Comment: rare   Drug use: No   Social History   Socioeconomic History   Marital status: Legally Separated    Spouse name: Not on file   Number of children: 2   Years of education: Not on file   Highest education level: Not on file  Occupational History   Occupation: CLIENT SER REP    Employer: [employer]   Occupation: Optician, dispensing    Comment: [employer]  Tobacco Use   Smoking status: Never   Smokeless tobacco: Never  Substance and Sexual Activity   Alcohol use: No    Comment: rare   Drug use: No   Sexual activity: Yes    Partners: Male  Other Topics Concern   Not on file  Social History Narrative   Exercise-- walks in am   Social Determinants of Health   Financial Resource Strain: Not on file  Food Insecurity: Not on file  Transportation Needs: Not on file  Physical Activity: Not on file  Stress: Not on  file  Social Connections: Not on file  Intimate Partner Violence: Not on file   Family Status  Relation Name Status   Mother  Deceased at age 77       breast cancer , kidney cancer   Father  Alive   Sister  (Not Specified)   MGM  (Not Specified)   MGF  (Not Specified)   Mat Aunt  (Not Specified)   Emelda Brothers  (Not Specified)   Cousin  Deceased   Other  (Not Specified)   Family History  Problem Relation Age of Onset   Hypertension Mother    Arthritis Mother    Breast cancer Mother 51   Cancer Mother        breast   Diabetes Mother    Hyperlipidemia Mother    Diabetes Father    Hyperlipidemia Father     Crohn's disease Sister        twin   Hypertension Maternal Grandmother    Arthritis Maternal Grandmother    Heart disease Maternal Grandfather 67       mi   Hypertension Maternal Aunt    Breast cancer Maternal Aunt    Diabetes Paternal Aunt    Breast cancer Cousin    Cancer Cousin    Asthma Other        children   No Known Allergies    Review of Systems  Constitutional:  Negative for fever and malaise/fatigue.  HENT:  Negative for congestion.   Eyes:  Negative for blurred vision.  Respiratory:  Negative for cough and shortness of breath.   Cardiovascular:  Negative for chest pain, palpitations and leg swelling.  Gastrointestinal:  Negative for vomiting.  Musculoskeletal:  Negative for back pain.  Skin:  Negative for rash.  Neurological:  Negative for loss of consciousness and headaches.      Objective:     BP 134/68 (BP Location: Right Arm, Patient Position: Sitting, Cuff Size: Normal)   Pulse (!) 102   Temp 98.7 F (37.1 C) (Oral)   Resp 18   Ht 5\' 4"  (1.626 m)   Wt 148 lb 3.2 oz (67.2 kg)   LMP 02/17/2018   SpO2 99%   BMI 25.44 kg/m  BP Readings from Last 3 Encounters:  07/05/23 134/68  04/16/23 130/84  10/01/22 120/70   Wt Readings from Last 3 Encounters:  07/05/23 148 lb 3.2 oz (67.2 kg)  04/16/23 154 lb 4 oz (70 kg)  10/01/22 151 lb 6.4 oz (68.7 kg)   SpO2 Readings from Last 3 Encounters:  07/05/23 99%  04/16/23 98%  10/01/22 99%      Physical Exam Vitals and nursing note reviewed.  Constitutional:      General: She is not in acute distress.    Appearance: Normal appearance. She is well-developed.  HENT:     Head: Normocephalic and atraumatic.  Eyes:     General: No scleral icterus.       Right eye: No discharge.        Left eye: No discharge.  Cardiovascular:     Rate and Rhythm: Normal rate and regular rhythm.     Heart sounds: No murmur heard. Pulmonary:     Effort: Pulmonary effort is normal. No respiratory distress.     Breath  sounds: Normal breath sounds.  Musculoskeletal:        General: Normal range of motion.     Left wrist: Tenderness present.       Arms:  Cervical back: Normal range of motion and neck supple.     Right lower leg: No edema.     Left lower leg: No edema.     Comments: And weakness   Skin:    General: Skin is warm and dry.  Neurological:     General: No focal deficit present.     Mental Status: She is alert and oriented to person, place, and time.  Psychiatric:        Mood and Affect: Mood normal.        Behavior: Behavior normal.        Thought Content: Thought content normal.        Judgment: Judgment normal.      No results found for any visits on 07/05/23.  Last CBC Lab Results  Component Value Date   WBC 5.4 04/16/2023   HGB 14.1 04/16/2023   HCT 43.6 04/16/2023   MCV 77.7 (L) 04/16/2023   MCH 25.2 (L) 03/10/2021   RDW 13.8 04/16/2023   PLT 246.0 04/16/2023   Last metabolic panel Lab Results  Component Value Date   GLUCOSE 121 (H) 04/16/2023   NA 140 04/16/2023   K 3.9 04/16/2023   CL 100 04/16/2023   CO2 31 04/16/2023   BUN 16 04/16/2023   CREATININE 0.99 04/16/2023   GFR 63.45 04/16/2023   CALCIUM 9.8 04/16/2023   PROT 7.3 04/16/2023   ALBUMIN 4.4 04/16/2023   BILITOT 0.4 04/16/2023   ALKPHOS 94 04/16/2023   AST 16 04/16/2023   ALT 15 04/16/2023   Last lipids Lab Results  Component Value Date   CHOL 311 (H) 04/16/2023   HDL 70.60 04/16/2023   LDLCALC 221 (H) 04/16/2023   LDLDIRECT 152.3 07/13/2013   TRIG 98.0 04/16/2023   CHOLHDL 4 04/16/2023   Last hemoglobin A1c Lab Results  Component Value Date   HGBA1C 13.4 (H) 04/16/2023   Last thyroid functions Lab Results  Component Value Date   TSH 2.16 03/30/2022   Last vitamin D No results found for: "25OHVITD2", "25OHVITD3", "VD25OH" Last vitamin B12 and Folate Lab Results  Component Value Date   VITAMINB12 558 04/16/2023      The 10-year ASCVD risk score (Arnett DK, et al., 2019)  is: 18%    Assessment & Plan:   Problem List Items Addressed This Visit       Unprioritized   Type 2 diabetes mellitus with hyperglycemia, with long-term current use of insulin (HCC)    hgba1c per Dr Talmage Nap,  minimize simple carbs. Increase exercise as tolerated. Continue current meds       Relevant Medications   insulin aspart protamine- aspart (NOVOLOG MIX 70/30) (70-30) 100 UNIT/ML injection   Left wrist pain - Primary    Con't with wrist splint Refer to ortho  Con't voltaren       Relevant Orders   Ambulatory referral to Orthopedic Surgery   Acne vulgaris   Relevant Medications   clindamycin (CLEOCIN-T) 1 % lotion  Assessment and Plan    Wrist Pain: Chronic wrist pain, possibly due to repetitive strain from typing. No numbness or tingling reported. Currently using over-the-counter Voltaren and a wrist stabilizer with some relief. -Refer to [facility] for further evaluation and management.  Skin Lesion: New skin lesion on the face, possibly due to bacterial infection. No significant itchiness or dryness reported. -Prescribe topical antibiotic cream.  Diabetes: Patient on NovoLog 70/30, 30 units twice daily, with some dose adjustment based on meal size. Last refill from  Dr. Talmage Nap. -Refill NovoLog 70/30 prescription.  Bereavement: Recent loss of husband, causing significant emotional distress. Patient plans to take 2-4 weeks off work for bereavement. -Encourage patient to seek support as needed during this difficult time.        No follow-ups on file.    Donato Schultz, DO

## 2023-07-05 NOTE — Assessment & Plan Note (Signed)
Con't with wrist splint Refer to ortho  Con't voltaren

## 2023-07-05 NOTE — Assessment & Plan Note (Signed)
hgba1c per Dr Talmage Nap,  minimize simple carbs. Increase exercise as tolerated. Continue current meds

## 2023-07-09 ENCOUNTER — Telehealth: Payer: Self-pay | Admitting: Family Medicine

## 2023-07-09 ENCOUNTER — Other Ambulatory Visit: Payer: Self-pay | Admitting: Family Medicine

## 2023-07-09 DIAGNOSIS — L700 Acne vulgaris: Secondary | ICD-10-CM

## 2023-07-09 NOTE — Telephone Encounter (Signed)
Pt just wanted to notify pcp that the cream that was given to her to help with a facial rash is not helping. She would like to get a dermatology referral.

## 2023-07-15 ENCOUNTER — Telehealth: Payer: Self-pay | Admitting: Family Medicine

## 2023-07-15 DIAGNOSIS — M25532 Pain in left wrist: Secondary | ICD-10-CM | POA: Diagnosis not present

## 2023-07-15 NOTE — Telephone Encounter (Signed)
Patient called and states pharmacy gave wrong RX she needs the Novolog 70/30 injections in a box of 5. I do see there is 1 refill on file but pharmacy states does not have it.

## 2023-07-16 NOTE — Telephone Encounter (Signed)
Attempted to call patient. VM was full 

## 2023-07-23 NOTE — Telephone Encounter (Signed)
Patient called regarding the Novolog RX, please call

## 2023-07-26 ENCOUNTER — Other Ambulatory Visit: Payer: Self-pay

## 2023-07-26 MED ORDER — NOVOLOG 70/30 FLEXPEN RELION (70-30) 100 UNIT/ML ~~LOC~~ SUPN: 30.0000 [IU] | PEN_INJECTOR | Freq: Two times a day (BID) | SUBCUTANEOUS | 2 refills | Status: DC

## 2023-07-27 DIAGNOSIS — M25532 Pain in left wrist: Secondary | ICD-10-CM | POA: Diagnosis not present

## 2023-08-05 DIAGNOSIS — M25532 Pain in left wrist: Secondary | ICD-10-CM | POA: Diagnosis not present

## 2023-09-09 ENCOUNTER — Other Ambulatory Visit: Payer: Self-pay | Admitting: Family Medicine

## 2023-09-09 DIAGNOSIS — L700 Acne vulgaris: Secondary | ICD-10-CM

## 2023-10-17 ENCOUNTER — Telehealth: Payer: Self-pay | Admitting: Family Medicine

## 2023-10-17 ENCOUNTER — Encounter: Payer: Self-pay | Admitting: Family Medicine

## 2023-10-17 ENCOUNTER — Ambulatory Visit: Payer: Federal, State, Local not specified - PPO | Admitting: Family Medicine

## 2023-10-17 VITALS — BP 128/88 | HR 73 | Temp 98.0°F | Resp 18 | Ht 64.0 in | Wt 149.2 lb

## 2023-10-17 DIAGNOSIS — M25532 Pain in left wrist: Secondary | ICD-10-CM

## 2023-10-17 DIAGNOSIS — I10 Essential (primary) hypertension: Secondary | ICD-10-CM

## 2023-10-17 DIAGNOSIS — Z794 Long term (current) use of insulin: Secondary | ICD-10-CM

## 2023-10-17 DIAGNOSIS — E1165 Type 2 diabetes mellitus with hyperglycemia: Secondary | ICD-10-CM

## 2023-10-17 DIAGNOSIS — E785 Hyperlipidemia, unspecified: Secondary | ICD-10-CM | POA: Diagnosis not present

## 2023-10-17 DIAGNOSIS — E1169 Type 2 diabetes mellitus with other specified complication: Secondary | ICD-10-CM

## 2023-10-17 DIAGNOSIS — B351 Tinea unguium: Secondary | ICD-10-CM

## 2023-10-17 LAB — CBC WITH DIFFERENTIAL/PLATELET
Basophils Absolute: 0 10*3/uL (ref 0.0–0.1)
Basophils Relative: 0.9 % (ref 0.0–3.0)
Eosinophils Absolute: 0.1 10*3/uL (ref 0.0–0.7)
Eosinophils Relative: 2 % (ref 0.0–5.0)
HCT: 43.1 % (ref 36.0–46.0)
Hemoglobin: 13.7 g/dL (ref 12.0–15.0)
Lymphocytes Relative: 35.2 % (ref 12.0–46.0)
Lymphs Abs: 1.5 10*3/uL (ref 0.7–4.0)
MCHC: 31.7 g/dL (ref 30.0–36.0)
MCV: 79.1 fL (ref 78.0–100.0)
Monocytes Absolute: 0.4 10*3/uL (ref 0.1–1.0)
Monocytes Relative: 9 % (ref 3.0–12.0)
Neutro Abs: 2.3 10*3/uL (ref 1.4–7.7)
Neutrophils Relative %: 52.9 % (ref 43.0–77.0)
Platelets: 234 10*3/uL (ref 150.0–400.0)
RBC: 5.45 Mil/uL — ABNORMAL HIGH (ref 3.87–5.11)
RDW: 13.9 % (ref 11.5–15.5)
WBC: 4.3 10*3/uL (ref 4.0–10.5)

## 2023-10-17 LAB — COMPREHENSIVE METABOLIC PANEL
ALT: 14 U/L (ref 0–35)
AST: 13 U/L (ref 0–37)
Albumin: 4.3 g/dL (ref 3.5–5.2)
Alkaline Phosphatase: 85 U/L (ref 39–117)
BUN: 12 mg/dL (ref 6–23)
CO2: 32 meq/L (ref 19–32)
Calcium: 9.9 mg/dL (ref 8.4–10.5)
Chloride: 96 meq/L (ref 96–112)
Creatinine, Ser: 1.04 mg/dL (ref 0.40–1.20)
GFR: 59.6 mL/min — ABNORMAL LOW (ref >60.00)
Glucose, Bld: 300 mg/dL — ABNORMAL HIGH (ref 70–99)
Potassium: 4.4 meq/L (ref 3.5–5.1)
Sodium: 137 meq/L (ref 135–145)
Total Bilirubin: 0.5 mg/dL (ref 0.2–1.2)
Total Protein: 7.1 g/dL (ref 6.0–8.3)

## 2023-10-17 LAB — LIPID PANEL
Cholesterol: 188 mg/dL (ref 0–200)
HDL: 56.4 mg/dL (ref >39.00)
LDL Cholesterol: 105 mg/dL — ABNORMAL HIGH (ref 0–99)
NonHDL: 131.29
Total CHOL/HDL Ratio: 3
Triglycerides: 132 mg/dL (ref 0.0–149.0)
VLDL: 26.4 mg/dL (ref 0.0–40.0)

## 2023-10-17 LAB — HEMOGLOBIN A1C: Hgb A1c MFr Bld: 12 % — ABNORMAL HIGH (ref 4.6–6.5)

## 2023-10-17 LAB — TSH: TSH: 3.14 u[IU]/mL (ref 0.35–5.50)

## 2023-10-17 MED ORDER — LISINOPRIL-HYDROCHLOROTHIAZIDE 20-12.5 MG PO TABS
2.0000 | ORAL_TABLET | Freq: Every day | ORAL | 1 refills | Status: DC
Start: 2023-10-17 — End: 2024-04-10

## 2023-10-17 MED ORDER — CICLOPIROX 8 % EX SOLN: Freq: Every day | CUTANEOUS | 2 refills | Status: DC

## 2023-10-17 NOTE — Patient Instructions (Signed)

## 2023-10-17 NOTE — Assessment & Plan Note (Signed)
Per ortho As low as she does not work more than 8 hours it does not bother her

## 2023-10-17 NOTE — Telephone Encounter (Signed)
Pt called to let Dr. Laury Axon know that she wanted to get a referral to Cardiology with Thomasene Ripple, DO based on her appt with her today.

## 2023-10-17 NOTE — Assessment & Plan Note (Signed)
Well controlled, no changes to meds. Encouraged heart healthy diet such as the DASH diet and exercise as tolerated.  °

## 2023-10-17 NOTE — Assessment & Plan Note (Signed)
Tolerating statin, encouraged heart healthy diet, avoid trans fats, minimize simple carbs and saturated fats. Increase exercise as tolerated

## 2023-10-17 NOTE — Progress Notes (Signed)
Established Patient Office Visit  Subjective   Patient ID: Paige Fleming, female    DOB: [birth year]  Age: 57 y.o. MRN: [id number]  Chief Complaint  Patient presents with   Hypertension   Hyperlipidemia   Follow-up    HPI Discussed the use of AI scribe software for clinical note transcription with the patient, who gave verbal consent to proceed.  History of Present Illness   The patient, with a history of diabetes, presents with a recurrence of toenail fungus. She had previously been treated by her endocrinologist, Dr. Lurene Shadow, with a lacquer applied daily for approximately six months, which had initially resolved the issue. However, a few weeks ago, she noticed the fungus returning on one toe. She has some lacquer left but will likely need more to treat the current infection.  The patient also mentions a recent move and the death of her husband, which has led to some weight loss due to decreased appetite. She is currently seeing a counselor for grief management.  In addition, she has been experiencing some wrist pain, which was evaluated by an orthopedic specialist. The patient underwent an MRI and was told she might have early stages of arthritis. She was advised to return to the clinic when in pain for further evaluation.  Lastly, the patient expresses concern about her cardiovascular health. She had participated in a cardiovascular screening program ten years ago and is considering doing it again. She is currently on a high dose of Crestor for cholesterol management.      Patient Active Problem List   Diagnosis Date Noted   Left wrist pain 07/05/2023   Acne vulgaris 07/05/2023   Preventative health care 03/30/2022   Type 2 diabetes mellitus with hyperglycemia, with long-term current use of insulin (HCC) 11/17/2018   Hyperlipidemia associated with type 2 diabetes mellitus (HCC) 11/17/2018   Hyperlipidemia LDL goal <100 05/05/2018   Cellulitis and abscess of digit  07/06/2013   Grief reaction 11/03/2012   CELLULITIS, FINGER 01/09/2011   Acute upper respiratory infection 12/19/2010   Essential hypertension 09/21/2010   Diabetes mellitus type II, uncontrolled 06/19/2010   HYPERGLYCEMIA, FASTING 06/19/2010   POSTTRAUMATIC STRESS DISORDER 05/16/2010   COUGH 01/25/2009   ACUTE SINUSITIS, UNSPECIFIED 08/30/2008   Allergic rhinitis 08/27/2008   CONTUSION OF BUTTOCK 07/20/2008   Past Medical History:  Diagnosis Date   Abortion history 1995   [id number]   Allergic rhinitis    Asthma    Diabetes mellitus type II    Gestational diabetes mellitus 2001   Hyperlipidemia    Hypertension    Past Surgical History:  Procedure Laterality Date   CERVICAL BIOPSY  2000   TUBAL LIGATION  2001   Social History   Tobacco Use   Smoking status: Never   Smokeless tobacco: Never  Substance Use Topics   Alcohol use: No    Comment: rare   Drug use: No   Social History   Socioeconomic History   Marital status: Legally Separated    Spouse name: Not on file   Number of children: 2   Years of education: Not on file   Highest education level: Not on file  Occupational History   Occupation: CLIENT SER REP    Employer: [employer]   Occupation: Optician, dispensing    Comment: [employer]  Tobacco Use   Smoking status: Never   Smokeless tobacco: Never  Substance and Sexual Activity   Alcohol use: No    Comment: rare   Drug use: No  Sexual activity: Yes    Partners: Male  Other Topics Concern   Not on file  Social History Narrative   Exercise-- walks in am   Social Determinants of Health   Financial Resource Strain: Not on file  Food Insecurity: Not on file  Transportation Needs: Not on file  Physical Activity: Not on file  Stress: Not on file  Social Connections: Not on file  Intimate Partner Violence: Not on file   Family Status  Relation Name Status   Mother  Deceased at age 38       breast cancer , kidney cancer   Father  Alive    Sister  (Not Specified)   MGM  (Not Specified)   MGF  (Not Specified)   Mat Aunt  (Not Specified)   Emelda Brothers  (Not Specified)   Cousin  Deceased   Other  (Not Specified)  No partnership data on file   Family History  Problem Relation Age of Onset   Hypertension Mother    Arthritis Mother    Breast cancer Mother 60   Cancer Mother        breast   Diabetes Mother    Hyperlipidemia Mother    Diabetes Father    Hyperlipidemia Father    Crohn's disease Sister        twin   Hypertension Maternal Grandmother    Arthritis Maternal Grandmother    Heart disease Maternal Grandfather 15       mi   Hypertension Maternal Aunt    Breast cancer Maternal Aunt    Diabetes Paternal Aunt    Breast cancer Cousin    Cancer Cousin    Asthma Other        children   No Known Allergies    Review of Systems  Constitutional:  Negative for fever and malaise/fatigue.  HENT:  Negative for congestion.   Eyes:  Negative for blurred vision.  Respiratory:  Negative for cough and shortness of breath.   Cardiovascular:  Negative for chest pain, palpitations and leg swelling.  Gastrointestinal:  Negative for abdominal pain, blood in stool, nausea and vomiting.  Genitourinary:  Negative for dysuria and frequency.  Musculoskeletal:  Negative for back pain and falls.  Skin:  Negative for rash.  Neurological:  Negative for dizziness, loss of consciousness and headaches.  Endo/Heme/Allergies:  Negative for environmental allergies.  Psychiatric/Behavioral:  Negative for depression. The patient is not nervous/anxious.    Diabetic Foot Exam - Simple   Simple Foot Form Diabetic Foot exam was performed with the following findings: Yes 10/17/2023  9:53 AM  Visual Inspection No deformities, no ulcerations, no other skin breakdown bilaterally: Yes Sensation Testing Intact to touch and monofilament testing bilaterally: Yes Pulse Check Posterior Tibialis and Dorsalis pulse intact bilaterally: Yes Comments        Objective:     BP 128/88 (BP Location: Left Arm, Patient Position: Sitting, Cuff Size: Normal)   Pulse 73   Temp 98 F (36.7 C) (Oral)   Resp 18   Ht 5\' 4"  (1.626 m)   Wt 149 lb 3.2 oz (67.7 kg)   LMP 02/17/2018   SpO2 100%   BMI 25.61 kg/m  BP Readings from Last 3 Encounters:  10/17/23 128/88  07/05/23 134/68  04/16/23 130/84   Wt Readings from Last 3 Encounters:  10/17/23 149 lb 3.2 oz (67.7 kg)  07/05/23 148 lb 3.2 oz (67.2 kg)  04/16/23 154 lb 4 oz (70 kg)   SpO2 Readings  from Last 3 Encounters:  10/17/23 100%  07/05/23 99%  04/16/23 98%      Physical Exam Vitals and nursing note reviewed.  Constitutional:      General: She is not in acute distress.    Appearance: Normal appearance. She is well-developed.  HENT:     Head: Normocephalic and atraumatic.  Eyes:     General: No scleral icterus.       Right eye: No discharge.        Left eye: No discharge.  Cardiovascular:     Rate and Rhythm: Normal rate and regular rhythm.     Heart sounds: No murmur heard. Pulmonary:     Effort: Pulmonary effort is normal. No respiratory distress.     Breath sounds: Normal breath sounds.  Musculoskeletal:        General: Normal range of motion.     Cervical back: Normal range of motion and neck supple.     Right lower leg: No edema.     Left lower leg: No edema.  Skin:    General: Skin is warm and dry.  Neurological:     Mental Status: She is alert and oriented to person, place, and time.  Psychiatric:        Mood and Affect: Mood normal.        Behavior: Behavior normal.        Thought Content: Thought content normal.        Judgment: Judgment normal.      No results found for any visits on 10/17/23.  Last CBC Lab Results  Component Value Date   WBC 5.4 04/16/2023   HGB 14.1 04/16/2023   HCT 43.6 04/16/2023   MCV 77.7 (L) 04/16/2023   MCH 25.2 (L) 03/10/2021   RDW 13.8 04/16/2023   PLT 246.0 04/16/2023   Last metabolic panel Lab Results   Component Value Date   GLUCOSE 121 (H) 04/16/2023   NA 140 04/16/2023   K 3.9 04/16/2023   CL 100 04/16/2023   CO2 31 04/16/2023   BUN 16 04/16/2023   CREATININE 0.99 04/16/2023   GFR 63.45 04/16/2023   CALCIUM 9.8 04/16/2023   PROT 7.3 04/16/2023   ALBUMIN 4.4 04/16/2023   BILITOT 0.4 04/16/2023   ALKPHOS 94 04/16/2023   AST 16 04/16/2023   ALT 15 04/16/2023   Last lipids Lab Results  Component Value Date   CHOL 311 (H) 04/16/2023   HDL 70.60 04/16/2023   LDLCALC 221 (H) 04/16/2023   LDLDIRECT 152.3 07/13/2013   TRIG 98.0 04/16/2023   CHOLHDL 4 04/16/2023   Last hemoglobin A1c Lab Results  Component Value Date   HGBA1C 13.4 (H) 04/16/2023   Last thyroid functions Lab Results  Component Value Date   TSH 2.16 03/30/2022   Last vitamin D No results found for: "25OHVITD2", "25OHVITD3", "VD25OH" Last vitamin B12 and Folate Lab Results  Component Value Date   VITAMINB12 558 04/16/2023      The 10-year ASCVD risk score (Arnett DK, et al., 2019) is: 15.7%    Assessment & Plan:   Problem List Items Addressed This Visit       Unprioritized   Type 2 diabetes mellitus with hyperglycemia, with long-term current use of insulin (HCC) - Primary    Per endo      Relevant Medications   lisinopril-hydrochlorothiazide (ZESTORETIC) 20-12.5 MG tablet   Other Relevant Orders   Comprehensive metabolic panel   Hemoglobin A1c   Left wrist pain  Per ortho As low as she does not work more than 8 hours it does not bother her       Hyperlipidemia associated with type 2 diabetes mellitus (HCC)    Tolerating statin, encouraged heart healthy diet, avoid trans fats, minimize simple carbs and saturated fats. Increase exercise as tolerated       Relevant Medications   lisinopril-hydrochlorothiazide (ZESTORETIC) 20-12.5 MG tablet   Other Relevant Orders   CBC with Differential/Platelet   Comprehensive metabolic panel   Lipid panel   Essential hypertension    Well  controlled, no changes to meds. Encouraged heart healthy diet such as the DASH diet and exercise as tolerated.        Relevant Medications   lisinopril-hydrochlorothiazide (ZESTORETIC) 20-12.5 MG tablet   Other Relevant Orders   TSH   Other Visit Diagnoses     Onychomycosis       Relevant Medications   ciclopirox (PENLAC) 8 % solution     Assessment and Plan    Onychomycosis Recurrence after successful treatment with antifungal lacquer. -Continue antifungal lacquer application daily.  Diabetes Mellitus No current complications reported. -Continue current management. -Order A1C to monitor control.  Hyperlipidemia Currently on high-dose Crestor. -Continue current management.  Cardiovascular Risk Interest in cardiovascular screening. -Consider calcium score and genetic blood tests to assess risk.  Grief and Weight Loss Recent bereavement and associated weight loss. -Continue current counseling. -Monitor weight and mental health closely.  General Health Maintenance -Consider colonoscopy when feasible. -Plan for COVID-19 booster shot in November.        Return in about 6 months (around 04/16/2024), or if symptoms worsen or fail to improve, for annual exam, fasting.    Donato Schultz, DO

## 2023-10-17 NOTE — Addendum Note (Signed)
Addended by: Seabron Spates R on: 10/17/2023 01:25 PM   Modules accepted: Orders

## 2023-10-17 NOTE — Assessment & Plan Note (Signed)
Per endo

## 2023-10-23 ENCOUNTER — Other Ambulatory Visit: Payer: Self-pay | Admitting: Family Medicine

## 2023-10-23 DIAGNOSIS — E785 Hyperlipidemia, unspecified: Secondary | ICD-10-CM

## 2023-11-27 DIAGNOSIS — N762 Acute vulvitis: Secondary | ICD-10-CM | POA: Diagnosis not present

## 2023-11-27 DIAGNOSIS — Z1231 Encounter for screening mammogram for malignant neoplasm of breast: Secondary | ICD-10-CM | POA: Diagnosis not present

## 2023-11-27 DIAGNOSIS — Z01419 Encounter for gynecological examination (general) (routine) without abnormal findings: Secondary | ICD-10-CM | POA: Diagnosis not present

## 2023-11-27 DIAGNOSIS — Z1331 Encounter for screening for depression: Secondary | ICD-10-CM | POA: Diagnosis not present

## 2023-11-27 DIAGNOSIS — N952 Postmenopausal atrophic vaginitis: Secondary | ICD-10-CM | POA: Diagnosis not present

## 2023-11-27 DIAGNOSIS — Z01411 Encounter for gynecological examination (general) (routine) with abnormal findings: Secondary | ICD-10-CM | POA: Diagnosis not present

## 2023-12-16 DIAGNOSIS — H524 Presbyopia: Secondary | ICD-10-CM | POA: Diagnosis not present

## 2023-12-16 DIAGNOSIS — E119 Type 2 diabetes mellitus without complications: Secondary | ICD-10-CM | POA: Diagnosis not present

## 2023-12-19 ENCOUNTER — Ambulatory Visit: Payer: Federal, State, Local not specified - PPO | Attending: Cardiology | Admitting: Cardiology

## 2023-12-19 ENCOUNTER — Encounter: Payer: Self-pay | Admitting: Cardiology

## 2023-12-19 VITALS — BP 158/78 | HR 94 | Ht 63.0 in | Wt 149.6 lb

## 2023-12-19 DIAGNOSIS — E1169 Type 2 diabetes mellitus with other specified complication: Secondary | ICD-10-CM

## 2023-12-19 DIAGNOSIS — E1165 Type 2 diabetes mellitus with hyperglycemia: Secondary | ICD-10-CM

## 2023-12-19 DIAGNOSIS — Z794 Long term (current) use of insulin: Secondary | ICD-10-CM

## 2023-12-19 DIAGNOSIS — E785 Hyperlipidemia, unspecified: Secondary | ICD-10-CM

## 2023-12-19 DIAGNOSIS — I10 Essential (primary) hypertension: Secondary | ICD-10-CM

## 2023-12-19 DIAGNOSIS — I517 Cardiomegaly: Secondary | ICD-10-CM

## 2023-12-19 MED ORDER — BLOOD PRESSURE MONITOR AUTOMAT DEVI
1.0000 [IU] | Freq: Once | 0 refills | Status: AC
Start: 2023-12-19 — End: 2023-12-19
  Filled 2023-12-19: qty 1, 30d supply, fill #0
  Filled 2023-12-21: qty 1, 1d supply, fill #0

## 2023-12-19 NOTE — Patient Instructions (Signed)
Medication Instructions:  Your physician recommends that you continue on your current medications as directed. Please refer to the Current Medication list given to you today.  *If you need a refill on your cardiac medications before your next appointment, please call your pharmacy*   Lab Work: Your physician recommends that you return for lab work in: next week or 2 for LP(a)  If you have labs (blood work) drawn today and your tests are completely normal, you will receive your results only by: MyChart Message (if you have MyChart) OR A paper copy in the mail If you have any lab test that is abnormal or we need to change your treatment, we will call you to review the results.   Testing/Procedures: Your physician has requested that you have an echocardiogram. Echocardiography is a painless test that uses sound waves to create images of your heart. It provides your doctor with information about the size and shape of your heart and how well your heart's chambers and valves are working. This procedure takes approximately one hour. There are no restrictions for this procedure. Please do NOT wear cologne, perfume, aftershave, or lotions (deodorant is allowed). Please arrive 15 minutes prior to your appointment time.  Please note: We ask at that you not bring children with you during ultrasound (echo/ vascular) testing. Due to room size and safety concerns, children are not allowed in the ultrasound rooms during exams. Our front office staff cannot provide observation of children in our lobby area while testing is being conducted. An adult accompanying a patient to their appointment will only be allowed in the ultrasound room at the discretion of the ultrasound technician under special circumstances. We apologize for any inconvenience.   Dr. Servando Salina has ordered a CT coronary calcium score.   Test locations:  [facility]  [facility] [place]  Imaging at [facility]  This is $99 out of pocket.   Coronary CalciumScan A coronary calcium scan is an imaging test used to look for deposits of calcium and other fatty materials (plaques) in the inner lining of the blood vessels of the heart (coronary arteries). These deposits of calcium and plaques can partly clog and narrow the coronary arteries without producing any symptoms or warning signs. This puts a person at risk for a heart attack. This test can detect these deposits before symptoms develop. Tell a health care provider about: Any allergies you have. All medicines you are taking, including vitamins, herbs, eye drops, creams, and over-the-counter medicines. Any problems you or family members have had with anesthetic medicines. Any blood disorders you have. Any surgeries you have had. Any medical conditions you have. Whether you are pregnant or may be pregnant. What are the risks? Generally, this is a safe procedure. However, problems may occur, including: Harm to a pregnant woman and her unborn baby. This test involves the use of radiation. Radiation exposure can be dangerous to a pregnant woman and her unborn baby. If you are pregnant, you generally should not have this procedure done. Slight increase in the risk of cancer. This is because of the radiation involved in the test. What happens before the procedure? No preparation is needed for this procedure. What happens during the procedure? You will undress and remove any jewelry around your neck or chest. You will put on a hospital gown. Sticky electrodes will be placed on your chest. The electrodes will be connected to an electrocardiogram (ECG) machine to record a tracing of  the electrical activity of your heart. A CT scanner will take pictures of your heart. During this time, you will be asked to lie still and hold your breath for 2-3 seconds while a picture of your heart is being taken. The procedure may vary among  health care providers and hospitals. What happens after the procedure? You can get dressed. You can return to your normal activities. It is up to you to get the results of your test. Ask your health care provider, or the department that is doing the test, when your results will be ready. Summary A coronary calcium scan is an imaging test used to look for deposits of calcium and other fatty materials (plaques) in the inner lining of the blood vessels of the heart (coronary arteries). Generally, this is a safe procedure. Tell your health care provider if you are pregnant or may be pregnant. No preparation is needed for this procedure. A CT scanner will take pictures of your heart. You can return to your normal activities after the scan is done. This information is not intended to replace advice given to you by your health care provider. Make sure you discuss any questions you have with your health care provider. Document Released: 06/14/2008 Document Revised: 11/05/2016 Document Reviewed: 11/05/2016 Elsevier Interactive Patient Education  2017 [employer].    Follow-Up: At [facility], you and your health needs are our priority.  As part of our continuing mission to provide you with exceptional heart care, we have created designated Provider Care Teams.  These Care Teams include your primary Cardiologist (physician) and Advanced Practice Providers (APPs -  Physician Assistants and Nurse Practitioners) who all work together to provide you with the care you need, when you need it.  We recommend signing up for the patient portal called "MyChart".  Sign up information is provided on this After Visit Summary.  MyChart is used to connect with patients for Virtual Visits (Telemedicine).  Patients are able to view lab/test results, encounter notes, upcoming appointments, etc.  Non-urgent messages can be sent to your provider as well.   To learn more about what you can do with MyChart, go to  [email or web address].    Your next appointment:   16 week(s)  Provider:   Dr. Servando Salina

## 2023-12-20 ENCOUNTER — Other Ambulatory Visit (HOSPITAL_COMMUNITY): Payer: Self-pay

## 2023-12-21 ENCOUNTER — Other Ambulatory Visit (HOSPITAL_BASED_OUTPATIENT_CLINIC_OR_DEPARTMENT_OTHER): Payer: Self-pay

## 2023-12-21 NOTE — Progress Notes (Signed)
Cardiology Office Note:    Date:  12/21/2023   ID:  Gara Kroner, DOB [birth year], MRN [id number]  PCP:  Zola Button, Grayling Congress, DO  Cardiologist:  Thomasene Ripple, DO  Electrophysiologist:  None   Referring MD: Zola Button, Grayling Congress, *   " I am   History of Present Illness:    Paige Fleming is a 57 y.o. female with a history of hypertension and diabetes, presents for a cardiovascular assessment.   She reports no current symptoms or issues, and her visit is primarily preventive. She has been managing her hypertension with Lisinopril and her cholesterol with Crestor. She was diagnosed with hypertension around the same time she was diagnosed with diabetes, which was in her late forties. She has a family history of hypertension on her maternal side, and her maternal grandfather died of a massive heart attack at age 83. She has been monitoring her blood pressure at home, but not consistently. She reports a sedentary lifestyle due to her work, but she used to walk regularly before the weather became colder. She has a history of gestational diabetes and was warned by her OBGYN that it could return, which it did. Her current A1c is reported to be 12.  Past Medical History:  Diagnosis Date   Abortion history 1995   G3P2012   Allergic rhinitis    Asthma    Diabetes mellitus type II    Gestational diabetes mellitus 2001   Hyperlipidemia    Hypertension     Past Surgical History:  Procedure Laterality Date   CERVICAL BIOPSY  2000   TUBAL LIGATION  2001    Current Medications: Current Meds  Medication Sig   azelastine (OPTIVAR) 0.05 % ophthalmic solution INSTILL 1 DROP INTO AFFECTED EYE TWICE A DAY   Azelastine HCl (ASTEPRO) 0.15 % SOLN Place 2 sprays into both nostrils daily.   Blood Pressure Monitoring (BLOOD PRESSURE MONITOR AUTOMAT) DEVI Use as directed   ciclopirox (PENLAC) 8 % solution Apply topically daily.   clindamycin (CLEOCIN T) 1 % lotion APPLY TOPICALLY  TWICE A DAY   Cyanocobalamin (B12 LIQUID HEALTH BOOSTER PO) Take by mouth.   EPINEPHrine 0.3 mg/0.3 mL IJ SOAJ injection AS DIRECTED FOR SYSTEMIC REACTIONS INJECTION 30 DAYS   glucose blood test strip Use as instructed   insulin aspart protamine - aspart (NOVOLOG 70/30 FLEXPEN) (70-30) 100 UNIT/ML FlexPen Inject 30 Units into the skin 2 (two) times daily.   Insulin Pen Needle (B-D UF III MINI PEN NEEDLES) 31G X 5 MM MISC As directed   levocetirizine (XYZAL) 5 MG tablet Take 5 mg by mouth every evening.   lisinopril-hydrochlorothiazide (ZESTORETIC) 20-12.5 MG tablet Take 2 tablets by mouth daily.   mometasone (ELOCON) 0.1 % cream Appy bid   ONETOUCH DELICA LANCETS MISC Check BS daily   rosuvastatin (CRESTOR) 40 MG tablet Take 1 tablet (40 mg total) by mouth daily.     Allergies:   Patient has no known allergies.   Social History   Socioeconomic History   Marital status: Legally Separated    Spouse name: Not on file   Number of children: 2   Years of education: Not on file   Highest education level: Not on file  Occupational History   Occupation: CLIENT SER REP    Employer: [employer]   Occupation: Optician, dispensing    Comment: [employer]  Tobacco Use   Smoking status: Never   Smokeless tobacco: Never  Substance and Sexual Activity   Alcohol use:  No    Comment: rare   Drug use: No   Sexual activity: Yes    Partners: Male  Other Topics Concern   Not on file  Social History Narrative   Exercise-- walks in am   Social Drivers of Health   Financial Resource Strain: Not on file  Food Insecurity: Not on file  Transportation Needs: Not on file  Physical Activity: Not on file  Stress: Not on file  Social Connections: Not on file     Family History: The patient's family history includes Arthritis in her maternal grandmother and mother; Asthma in an other family member; Breast cancer in her cousin and maternal aunt; Breast cancer (age of onset: 43) in her mother;  Cancer in her cousin and mother; Crohn's disease in her sister; Diabetes in her father, mother, and paternal aunt; Heart disease (age of onset: 38) in her maternal grandfather; Hyperlipidemia in her father and mother; Hypertension in her maternal aunt, maternal grandmother, and mother.  ROS:   Review of Systems  Constitution: Negative for decreased appetite, fever and weight gain.  HENT: Negative for congestion, ear discharge, hoarse voice and sore throat.   Eyes: Negative for discharge, redness, vision loss in right eye and visual halos.  Cardiovascular: Negative for chest pain, dyspnea on exertion, leg swelling, orthopnea and palpitations.  Respiratory: Negative for cough, hemoptysis, shortness of breath and snoring.   Endocrine: Negative for heat intolerance and polyphagia.  Hematologic/Lymphatic: Negative for bleeding problem. Does not bruise/bleed easily.  Skin: Negative for flushing, nail changes, rash and suspicious lesions.  Musculoskeletal: Negative for arthritis, joint pain, muscle cramps, myalgias, neck pain and stiffness.  Gastrointestinal: Negative for abdominal pain, bowel incontinence, diarrhea and excessive appetite.  Genitourinary: Negative for decreased libido, genital sores and incomplete emptying.  Neurological: Negative for brief paralysis, focal weakness, headaches and loss of balance.  Psychiatric/Behavioral: Negative for altered mental status, depression and suicidal ideas.  Allergic/Immunologic: Negative for HIV exposure and persistent infections.    EKGs/Labs/Other Studies Reviewed:    The following studies were reviewed today:   EKG:  The ekg ordered today demonstrates NSR with HR 94 bpm with possible left atrial enlargement .   Recent Labs: 10/17/2023: ALT 14; BUN 12; Creatinine, Ser 1.04; Hemoglobin 13.7; Platelets 234.0; Potassium 4.4; Sodium 137; TSH 3.14  Recent Lipid Panel    Component Value Date/Time   CHOL 188 10/17/2023 0905   TRIG 132.0 10/17/2023  0905   HDL 56.40 10/17/2023 0905   CHOLHDL 3 10/17/2023 0905   VLDL 26.4 10/17/2023 0905   LDLCALC 105 (H) 10/17/2023 0905   LDLCALC 220 (H) 03/10/2021 1410   LDLDIRECT 152.3 07/13/2013 0826    Physical Exam:    VS:  BP (!) 158/78 (BP Location: Right Arm, Patient Position: Sitting, Cuff Size: Normal)   Pulse 94   Ht 5\' 3"  (1.6 m)   Wt 149 lb 9.6 oz (67.9 kg)   LMP 02/17/2018   SpO2 97%   BMI 26.50 kg/m     Wt Readings from Last 3 Encounters:  12/19/23 149 lb 9.6 oz (67.9 kg)  10/17/23 149 lb 3.2 oz (67.7 kg)  07/05/23 148 lb 3.2 oz (67.2 kg)     GEN: Well nourished, well developed in no acute distress HEENT: Normal NECK: No JVD; No carotid bruits LYMPHATICS: No lymphadenopathy CARDIAC: S1S2 noted,RRR, no murmurs, rubs, gallops RESPIRATORY:  Clear to auscultation without rales, wheezing or rhonchi  ABDOMEN: Soft, non-tender, non-distended, +bowel sounds, no guarding. EXTREMITIES: No edema, No  cyanosis, no clubbing MUSCULOSKELETAL:  No deformity  SKIN: Warm and dry NEUROLOGIC:  Alert and oriented x 3, non-focal PSYCHIATRIC:  Normal affect, good insight  ASSESSMENT:    1. Essential hypertension   2. Hyperlipidemia associated with type 2 diabetes mellitus (HCC)   3. Type 2 diabetes mellitus with hyperglycemia, with long-term current use of insulin (HCC)   4. Left atrial enlargement    PLAN:    Hypertension - Elevated blood pressure in the office. Patient is currently on Lisinopril 40mg  daily. Discussed the possibility of adding Amlodipine or changing the antihypertensive medication. -Encouraged patient to monitor blood pressure at home daily for two weeks and upload readings via MyChart. -Consider adding Amlodipine or changing antihypertensive medication based on home blood pressure readings.  Diabetes Mellitus - Patient has a history of gestational diabetes and was diagnosed with diabetes in her late 66s. Current HbA1c is 12, indicating poor glycemic  control. -Encouraged patient to follow up with her endocrinologist.  Cardiovascular Risk Assessment - Patient has a family history of hypertension and a grandfather who died of a massive heart attack at age 17. She is interested in preventive care and had an ABI done 10 years ago which was normal. Order an echocardiogram to assess heart function and wall thickness. -Consider Coronary Artery Calcification CT scan for further risk assessment - she is aware that there may be an associated cost for CT calcium score.  Hyperlipidemia - continue current statin   General Health Maintenance - -Encourage patient to maintain a healthy diet and regular exercise, particularly walking as tolerated. Follow-up appointment in 2 weeks to review home blood pressure readings and discuss further management.  The patient is in agreement with the above plan. The patient left the office in stable condition.  The patient will follow up in   Medication Adjustments/Labs and Tests Ordered: Current medicines are reviewed at length with the patient today.  Concerns regarding medicines are outlined above.  Orders Placed This Encounter  Procedures   CT CARDIAC SCORING (SELF PAY ONLY)   Lipoprotein A (LPA)   EKG 12-Lead   ECHOCARDIOGRAM COMPLETE   Meds ordered this encounter  Medications   Blood Pressure Monitoring (BLOOD PRESSURE MONITOR AUTOMAT) DEVI    Sig: Use as directed    Dispense:  1 each    Refill:  0    Patient Instructions  Medication Instructions:  Your physician recommends that you continue on your current medications as directed. Please refer to the Current Medication list given to you today.  *If you need a refill on your cardiac medications before your next appointment, please call your pharmacy*   Lab Work: Your physician recommends that you return for lab work in: next week or 2 for LP(a)  If you have labs (blood work) drawn today and your tests are completely normal, you will receive your  results only by: MyChart Message (if you have MyChart) OR A paper copy in the mail If you have any lab test that is abnormal or we need to change your treatment, we will call you to review the results.   Testing/Procedures: Your physician has requested that you have an echocardiogram. Echocardiography is a painless test that uses sound waves to create images of your heart. It provides your doctor with information about the size and shape of your heart and how well your heart's chambers and valves are working. This procedure takes approximately one hour. There are no restrictions for this procedure. Please do NOT wear cologne, perfume,  aftershave, or lotions (deodorant is allowed). Please arrive 15 minutes prior to your appointment time.  Please note: We ask at that you not bring children with you during ultrasound (echo/ vascular) testing. Due to room size and safety concerns, children are not allowed in the ultrasound rooms during exams. Our front office staff cannot provide observation of children in our lobby area while testing is being conducted. An adult accompanying a patient to their appointment will only be allowed in the ultrasound room at the discretion of the ultrasound technician under special circumstances. We apologize for any inconvenience.   Dr. Servando Salina has ordered a CT coronary calcium score.   Test locations:  [facility]  [place] at [facility]  This is $99 out of pocket.   Coronary CalciumScan A coronary calcium scan is an imaging test used to look for deposits of calcium and other fatty materials (plaques) in the inner lining of the blood vessels of the heart (coronary arteries). These deposits of calcium and plaques can partly clog and narrow the coronary arteries without producing any symptoms or warning signs. This puts a person at risk for a heart attack. This test can detect these deposits  before symptoms develop. Tell a health care provider about: Any allergies you have. All medicines you are taking, including vitamins, herbs, eye drops, creams, and over-the-counter medicines. Any problems you or family members have had with anesthetic medicines. Any blood disorders you have. Any surgeries you have had. Any medical conditions you have. Whether you are pregnant or may be pregnant. What are the risks? Generally, this is a safe procedure. However, problems may occur, including: Harm to a pregnant woman and her unborn baby. This test involves the use of radiation. Radiation exposure can be dangerous to a pregnant woman and her unborn baby. If you are pregnant, you generally should not have this procedure done. Slight increase in the risk of cancer. This is because of the radiation involved in the test. What happens before the procedure? No preparation is needed for this procedure. What happens during the procedure? You will undress and remove any jewelry around your neck or chest. You will put on a hospital gown. Sticky electrodes will be placed on your chest. The electrodes will be connected to an electrocardiogram (ECG) machine to record a tracing of the electrical activity of your heart. A CT scanner will take pictures of your heart. During this time, you will be asked to lie still and hold your breath for 2-3 seconds while a picture of your heart is being taken. The procedure may vary among health care providers and hospitals. What happens after the procedure? You can get dressed. You can return to your normal activities. It is up to you to get the results of your test. Ask your health care provider, or the department that is doing the test, when your results will be ready. Summary A coronary calcium scan is an imaging test used to look for deposits of calcium and other fatty materials (plaques) in the inner lining of the blood vessels of the heart (coronary  arteries). Generally, this is a safe procedure. Tell your health care provider if you are pregnant or may be pregnant. No preparation is needed for this procedure. A CT scanner will take pictures of your heart. You can return to your normal activities after the scan is done. This information is not intended to replace advice given to you by your  health care provider. Make sure you discuss any questions you have with your health care provider. Document Released: 06/14/2008 Document Revised: 11/05/2016 Document Reviewed: 11/05/2016 Elsevier Interactive Patient Education  2017 [employer].    Follow-Up: At [facility], you and your health needs are our priority.  As part of our continuing mission to provide you with exceptional heart care, we have created designated Provider Care Teams.  These Care Teams include your primary Cardiologist (physician) and Advanced Practice Providers (APPs -  Physician Assistants and Nurse Practitioners) who all work together to provide you with the care you need, when you need it.  We recommend signing up for the patient portal called "MyChart".  Sign up information is provided on this After Visit Summary.  MyChart is used to connect with patients for Virtual Visits (Telemedicine).  Patients are able to view lab/test results, encounter notes, upcoming appointments, etc.  Non-urgent messages can be sent to your provider as well.   To learn more about what you can do with MyChart, go to [email or web address].    Your next appointment:   16 week(s)  Provider:   Dr. Servando Salina   Adopting a Healthy Lifestyle.  Know what a healthy weight is for you (roughly BMI <25) and aim to maintain this   Aim for 7+ servings of fruits and vegetables daily   65-80+ fluid ounces of water or unsweet tea for healthy kidneys   Limit to max 1 drink of alcohol per day; avoid smoking/tobacco   Limit animal fats in diet for cholesterol and heart health - choose grass fed  whenever available   Avoid highly processed foods, and foods high in saturated/trans fats   Aim for low stress - take time to unwind and care for your mental health   Aim for 150 min of moderate intensity exercise weekly for heart health, and weights twice weekly for bone health   Aim for 7-9 hours of sleep daily   When it comes to diets, agreement about the perfect plan isnt easy to find, even among the experts. Experts at the [facility] [employer] developed an idea known as the Healthy Eating Plate. Just imagine a plate divided into logical, healthy portions.   The emphasis is on diet quality:   Load up on vegetables and fruits - one-half of your plate: Aim for color and variety, and remember that potatoes dont count.   Go for whole grains - one-quarter of your plate: Whole wheat, barley, wheat berries, quinoa, oats, brown rice, and foods made with them. If you want pasta, go with whole wheat pasta.   Protein power - one-quarter of your plate: Fish, chicken, beans, and nuts are all healthy, versatile protein sources. Limit red meat.   The diet, however, does go beyond the plate, offering a few other suggestions.   Use healthy plant oils, such as olive, canola, soy, corn, sunflower and peanut. Check the labels, and avoid partially hydrogenated oil, which have unhealthy trans fats.   If youre thirsty, drink water. Coffee and tea are good in moderation, but skip sugary drinks and limit milk and dairy products to one or two daily servings.   The type of carbohydrate in the diet is more important than the amount. Some sources of carbohydrates, such as vegetables, fruits, whole grains, and beans-are healthier than others.   Finally, stay active  Signed, Thomasene Ripple, DO  12/21/2023 11:03 PM    [facility]

## 2024-01-03 ENCOUNTER — Ambulatory Visit (HOSPITAL_COMMUNITY)
Admission: RE | Admit: 2024-01-03 | Discharge: 2024-01-03 | Disposition: A | Discharge status: Home / Self Care | Payer: Self-pay | Source: Ambulatory Visit | Attending: Cardiology | Admitting: Cardiology

## 2024-01-03 DIAGNOSIS — I10 Essential (primary) hypertension: Secondary | ICD-10-CM | POA: Diagnosis not present

## 2024-01-03 DIAGNOSIS — E785 Hyperlipidemia, unspecified: Secondary | ICD-10-CM | POA: Insufficient documentation

## 2024-01-03 DIAGNOSIS — E1169 Type 2 diabetes mellitus with other specified complication: Secondary | ICD-10-CM | POA: Diagnosis not present

## 2024-01-04 LAB — LIPOPROTEIN A (LPA): Lipoprotein (a): 289.5 nmol/L — ABNORMAL HIGH (ref <75.0)

## 2024-01-06 ENCOUNTER — Telehealth: Payer: Self-pay | Admitting: Cardiology

## 2024-01-06 NOTE — Telephone Encounter (Signed)
 Patient is returning call to discuss CT results + schedule virtual appointment.

## 2024-01-10 ENCOUNTER — Telehealth: Payer: Self-pay

## 2024-01-10 NOTE — Progress Notes (Signed)
 Care Guide Pharmacy Note  01/10/2024 Name: Braylinn Gulden MRN: [id number] DOB: 03-Jul-1966  Referred By: Antonio Cyndee Jamee JONELLE, DO Reason for referral: Care Coordination (TNM Diabetes. )   Corbyn Steedman is a 58 y.o. year old female who is a primary care patient of Antonio Cyndee Jamee JONELLE, DO.  Luke Fritter was referred to the pharmacist for assistance related to: DMII  An unsuccessful telephone outreach was attempted today to contact the patient who was referred to the pharmacy team for assistance with diabetes. Additional attempts will be made to contact the patient.  Dreama Lynwood [facility]/VBCI-Population Health [facility] Assistant [phone]

## 2024-01-10 NOTE — Telephone Encounter (Signed)
Called pt, no answer.  Left message.

## 2024-01-10 NOTE — Telephone Encounter (Signed)
 Called pt to set her up with an appt with Dr. Servando Salina to discuss results. (Openings Tuesday morning 1/14.)

## 2024-01-14 ENCOUNTER — Ambulatory Visit: Payer: Federal, State, Local not specified - PPO | Attending: Cardiology | Admitting: Cardiology

## 2024-01-14 ENCOUNTER — Telehealth: Payer: Self-pay

## 2024-01-14 ENCOUNTER — Encounter: Payer: Self-pay | Admitting: Cardiology

## 2024-01-14 VITALS — BP 139/78 | HR 84 | Ht 63.0 in | Wt 148.0 lb

## 2024-01-14 DIAGNOSIS — E1169 Type 2 diabetes mellitus with other specified complication: Secondary | ICD-10-CM | POA: Diagnosis not present

## 2024-01-14 DIAGNOSIS — E119 Type 2 diabetes mellitus without complications: Secondary | ICD-10-CM

## 2024-01-14 DIAGNOSIS — E785 Hyperlipidemia, unspecified: Secondary | ICD-10-CM

## 2024-01-14 DIAGNOSIS — E7841 Elevated Lipoprotein(a): Secondary | ICD-10-CM | POA: Diagnosis not present

## 2024-01-14 DIAGNOSIS — Z794 Long term (current) use of insulin: Secondary | ICD-10-CM

## 2024-01-14 NOTE — Progress Notes (Signed)
 Care Guide Pharmacy Note  01/14/2024 Name: Paige Fleming MRN: [id number] DOB: Jul 11, 1966  Referred By: Antonio Cyndee Jamee JONELLE, DO Reason for referral: Care Coordination (TNM Diabetes. )   Paige Fleming is a 58 y.o. year old female who is a primary care patient of Antonio Cyndee Jamee JONELLE, DO.  Paige Fleming was referred to the pharmacist for assistance related to: DMII  Successful contact was made with the patient to discuss pharmacy services.  Patient declines engagement at this time. Contact information was provided to the patient should they wish to reach out for assistance at a later time.  Paige Fleming Health/VBCI-Population Health [facility] Assistant [phone]

## 2024-01-14 NOTE — Progress Notes (Signed)
 Virtual Visit via Video Note   Because of Paige Fleming's co-morbid illnesses, she is at least at moderate risk for complications without adequate follow up.  This format is felt to be most appropriate for this patient at this time.  All issues noted in this document were discussed and addressed.  A limited physical exam was performed with this format.  Please refer to the patient's chart for her consent to telehealth for [facility]   Date:  01/17/2024   ID:  Luke Fritter, DOB [birth year], MRN [id number]  Patient Location: Home Provider Location: Office/Clinic  PCP:  Antonio Cyndee Jamee JONELLE, DO  Cardiologist:  Beren Yniguez, DO  Electrophysiologist:  None   Evaluation Performed:  Follow-Up Visit  Chief Complaint:   I am doing well  History of Present Illness:    Paige Fleming is a 58 y.o. female with Hypertension , Type 2 Diabetes Mellitus, She is her today to discuss her result for the lipoprotein a.   She offer no complaints at this time.   The patient does not have symptoms concerning for COVID-19 infection (fever, chills, cough, or new shortness of breath).    Past Medical History:  Diagnosis Date   Abortion history 1995   G3P2012   Allergic rhinitis    Asthma    Diabetes mellitus type II    Gestational diabetes mellitus 2001   Hyperlipidemia    Hypertension    Past Surgical History:  Procedure Laterality Date   CERVICAL BIOPSY  2000   TUBAL LIGATION  2001     Current Meds  Medication Sig   azelastine  (OPTIVAR ) 0.05 % ophthalmic solution INSTILL 1 DROP INTO AFFECTED EYE TWICE A DAY   Azelastine  HCl (ASTEPRO ) 0.15 % SOLN Place 2 sprays into both nostrils daily.   ciclopirox  (PENLAC ) 8 % solution Apply topically daily.   clindamycin  (CLEOCIN  T) 1 % lotion APPLY TOPICALLY TWICE A DAY   Cyanocobalamin  (B12 LIQUID HEALTH BOOSTER PO) Take by mouth.   EPINEPHrine  0.3 mg/0.3 mL IJ SOAJ injection AS DIRECTED FOR SYSTEMIC  REACTIONS INJECTION 30 DAYS   glucose blood test strip Use as instructed   insulin  aspart protamine - aspart (NOVOLOG  70/30 FLEXPEN) (70-30) 100 UNIT/ML FlexPen Inject 30 Units into the skin 2 (two) times daily.   Insulin  Pen Needle (B-D UF III MINI PEN NEEDLES) 31G X 5 MM MISC As directed   lisinopril -hydrochlorothiazide  (ZESTORETIC ) 20-12.5 MG tablet Take 2 tablets by mouth daily.   mometasone  (ELOCON ) 0.1 % cream Appy bid   ONETOUCH DELICA LANCETS MISC Check BS daily   rosuvastatin  (CRESTOR ) 40 MG tablet Take 1 tablet (40 mg total) by mouth daily.     Allergies:   Patient has no known allergies.   Social History   Tobacco Use   Smoking status: Never   Smokeless tobacco: Never  Substance Use Topics   Alcohol use: No    Comment: rare   Drug use: No     Family Hx: The patient's family history includes Arthritis in her maternal grandmother and mother; Asthma in an other family member; Breast cancer in her cousin and maternal aunt; Breast cancer (age of onset: 38) in her mother; Cancer in her cousin and mother; Crohn's disease in her sister; Diabetes in her father, mother, and paternal aunt; Heart disease (age of onset: 58) in her maternal grandfather; Hyperlipidemia in her father and mother; Hypertension in her maternal aunt, maternal grandmother, and mother.  ROS:   Please see the history of  present illness.     All other systems reviewed and are negative.   Prior CV studies:   The following studies were reviewed today:  Coronary ct and echo   Labs/Other Tests and Data Reviewed:    EKG:  No ECG reviewed.  Recent Labs: 10/17/2023: ALT 14; BUN 12; Creatinine, Ser 1.04; Hemoglobin 13.7; Platelets 234.0; Potassium 4.4; Sodium 137; TSH 3.14   Recent Lipid Panel Lab Results  Component Value Date/Time   CHOL 188 10/17/2023 09:05 AM   TRIG 132.0 10/17/2023 09:05 AM   HDL 56.40 10/17/2023 09:05 AM   CHOLHDL 3 10/17/2023 09:05 AM   LDLCALC 105 (H) 10/17/2023 09:05 AM    LDLCALC 220 (H) 03/10/2021 02:10 PM   LDLDIRECT 152.3 07/13/2013 08:26 AM    Wt Readings from Last 3 Encounters:  01/14/24 148 lb (67.1 kg)  12/19/23 149 lb 9.6 oz (67.9 kg)  10/17/23 149 lb 3.2 oz (67.7 kg)     Objective:    Vital Signs:  BP 139/78 (BP Location: Left Arm, Patient Position: Sitting, Cuff Size: Normal)   Pulse 84   Ht 5' 3 (1.6 m)   Wt 148 lb (67.1 kg)   LMP 02/17/2018   BMI 26.22 kg/m      ASSESSMENT & PLAN:    Elevated Lpa  Mixed Hyperlipidemia  Type 2 Diabetes Mellitus on insulin    We discussed what it means to have elevated Lpa and steps we can take for primary prevention.  Educated on opportunity for research participation.   Continue crestor   COVID-19 Education: The signs and symptoms of COVID-19 were discussed with the patient and how to seek care for testing (follow up with PCP or arrange E-visit).   The importance of social distancing was discussed today.  Time:   Today, I have spent 15 minutes with the patient with telehealth technology discussing the above problems.     Medication Adjustments/Labs and Tests Ordered: Current medicines are reviewed at length with the patient today.  Concerns regarding medicines are outlined above.   Tests Ordered: No orders of the defined types were placed in this encounter.   Medication Changes: No orders of the defined types were placed in this encounter.   Follow Up:  In Person in 1 year(s)  Signed, Johnluke Haugen, DO  01/17/2024 1:30 PM    [facility]

## 2024-01-15 NOTE — Patient Instructions (Signed)
 Medication Instructions:  Your physician recommends that you continue on your current medications as directed. Please refer to the Current Medication list given to you today.  *If you need a refill on your cardiac medications before your next appointment, please call your pharmacy*  Follow-Up: At [facility], you and your health needs are our priority.  As part of our continuing mission to provide you with exceptional heart care, we have created designated Provider Care Teams.  These Care Teams include your primary Cardiologist (physician) and Advanced Practice Providers (APPs -  Physician Assistants and Nurse Practitioners) who all work together to provide you with the care you need, when you need it.  Your next appointment:   1 year(s)  Provider:   Thomasene Ripple, DO     Other Instructions:

## 2024-01-23 ENCOUNTER — Ambulatory Visit (HOSPITAL_COMMUNITY)
Admission: RE | Admit: 2024-01-23 | Discharge: 2024-01-23 | Disposition: A | Discharge status: Home / Self Care | Payer: Federal, State, Local not specified - PPO | Source: Ambulatory Visit | Attending: Internal Medicine | Admitting: Internal Medicine

## 2024-01-23 ENCOUNTER — Encounter: Payer: Self-pay | Admitting: Cardiology

## 2024-01-23 DIAGNOSIS — E785 Hyperlipidemia, unspecified: Secondary | ICD-10-CM

## 2024-01-23 DIAGNOSIS — I10 Essential (primary) hypertension: Secondary | ICD-10-CM | POA: Diagnosis not present

## 2024-01-23 DIAGNOSIS — E1169 Type 2 diabetes mellitus with other specified complication: Secondary | ICD-10-CM

## 2024-01-23 LAB — ECHOCARDIOGRAM COMPLETE
AR max vel: 1.65 cm2
AV Area VTI: 1.67 cm2
AV Area mean vel: 1.67 cm2
AV Mean grad: 4 mm[Hg]
AV Peak grad: 7 mm[Hg]
Ao pk vel: 1.32 m/s
Area-P 1/2: 5.42 cm2
MV M vel: 0.82 m/s
MV Peak grad: 2.7 mm[Hg]
S' Lateral: 2.22 cm

## 2024-02-07 LAB — POCT ABI - SCREENING FOR PILOT NO CHARGE
Left ABI: 0.98
Right ABI: 1

## 2024-02-07 NOTE — Progress Notes (Signed)
 No pending issues at this time

## 2024-02-13 ENCOUNTER — Telehealth: Payer: Self-pay | Admitting: *Deleted

## 2024-02-13 NOTE — Telephone Encounter (Signed)
Patient was identified as falling into the [place] Measure - Diabetes.   Patient was: Appointment scheduled with primary care provider in the next 30 days.

## 2024-03-15 ENCOUNTER — Other Ambulatory Visit: Payer: Self-pay | Admitting: Family Medicine

## 2024-04-02 NOTE — Progress Notes (Unsigned)
 The patient attended a screening event on 02/07/2024. Pt does not have bp and blood glucose results reported. During the event, the patient reported that she does not smoke, has private/VA insurance, is established with a primary care provider (Dr. Lavona Mound Tobb), and does not have any indicated SDOH needs.   A chart review confirmed that the pt's PCP is Seabron Spates, DO with [facility]. The patient's most recent office visit was on 10/17/2023 and a follow-up appointment with her PCP is scheduled for 04/16/2024 at 8:20 AM.The patient demonstrates consistent engagement in her care. Chart review further indicates that the pt has BCBS as her insurance.  At this time, no additional support from the Health Equity Team is indicated.

## 2024-04-10 ENCOUNTER — Encounter: Payer: Self-pay | Admitting: Cardiology

## 2024-04-10 ENCOUNTER — Ambulatory Visit: Payer: Federal, State, Local not specified - PPO | Attending: Cardiology | Admitting: Cardiology

## 2024-04-10 VITALS — BP 158/80 | HR 90 | Ht 64.0 in | Wt 152.2 lb

## 2024-04-10 DIAGNOSIS — E1165 Type 2 diabetes mellitus with hyperglycemia: Secondary | ICD-10-CM | POA: Diagnosis not present

## 2024-04-10 DIAGNOSIS — I10 Essential (primary) hypertension: Secondary | ICD-10-CM

## 2024-04-10 DIAGNOSIS — Z794 Long term (current) use of insulin: Secondary | ICD-10-CM

## 2024-04-10 DIAGNOSIS — E782 Mixed hyperlipidemia: Secondary | ICD-10-CM | POA: Diagnosis not present

## 2024-04-10 DIAGNOSIS — E7841 Elevated Lipoprotein(a): Secondary | ICD-10-CM | POA: Diagnosis not present

## 2024-04-10 MED ORDER — OLMESARTAN MEDOXOMIL-HCTZ 20-12.5 MG PO TABS: 1.0000 | ORAL_TABLET | Freq: Every day | ORAL | 3 refills | Status: AC

## 2024-04-10 NOTE — Progress Notes (Signed)
 Cardiology Office Note:    Date:  04/10/2024   ID:  Earna Go, DOB [birth year], MRN [id number]  PCP:  Crecencio Dodge, Candida Chalk, DO  Cardiologist:  Jadelyn Elks, DO  Electrophysiologist:  None   Referring MD: Crecencio Dodge, Candida Chalk, *   " I am doing fine"  History of Present Illness:    Gwynneth Fabio is a 58 y.o. female with a hx of hypertension, hyperlipidemia, DM2 and elevated Lpa here today.   Since her last visit with me she has been taking her blood pressure and the systolic blood pressure seems to fluctuate.  She offers no other complaint at this time.     The patient has been diligent in monitoring her blood pressure and has noticed a pattern of fluctuation, with readings ranging from 110 to 134. The patient reports no associated symptoms such as chest pain or shortness of breath. The patient is currently on lisinopril and hydrochlorothiazide for hypertension, and Crestor, which was started in 2009.  In addition to the blood pressure concerns, the patient also mentions a small lump on her left leg, which has been present for several years. The patient reports no associated pain or discomfort from this lump.  The patient also has a history of diabetes, for which she is under the care of another physician, Dr. Jalene Mayor. The patient is due to see Dr. Jalene Mayor in the coming week for a follow-up appointment.  Past Medical History:  Diagnosis Date   Abortion history 1995   G3P2012   Allergic rhinitis    Asthma    Diabetes mellitus type II    Gestational diabetes mellitus 2001   Hyperlipidemia    Hypertension     Past Surgical History:  Procedure Laterality Date   CERVICAL BIOPSY  2000   TUBAL LIGATION  2001    Current Medications: Current Meds  Medication Sig   ciclopirox (PENLAC) 8 % solution Apply topically daily.   clindamycin (CLEOCIN T) 1 % lotion APPLY TOPICALLY TWICE A DAY   Cyanocobalamin (B12 LIQUID HEALTH BOOSTER PO) Take by mouth.    EPINEPHrine 0.3 mg/0.3 mL IJ SOAJ injection AS DIRECTED FOR SYSTEMIC REACTIONS INJECTION 30 DAYS   glucose blood test strip Use as instructed   insulin aspart protamine - aspart (NOVOLOG MIX 70/30 FLEXPEN) (70-30) 100 UNIT/ML FlexPen Inject 30 Units into the skin 2 (two) times daily with a meal.   Insulin Pen Needle (B-D UF III MINI PEN NEEDLES) 31G X 5 MM MISC As directed   levocetirizine (XYZAL) 5 MG tablet Take 5 mg by mouth every evening.   mometasone (ELOCON) 0.1 % cream Appy bid   olmesartan-hydrochlorothiazide (BENICAR HCT) 20-12.5 MG tablet Take 1 tablet by mouth daily.   ONETOUCH DELICA LANCETS MISC Check BS daily   rosuvastatin (CRESTOR) 40 MG tablet Take 1 tablet (40 mg total) by mouth daily.   [DISCONTINUED] lisinopril-hydrochlorothiazide (ZESTORETIC) 20-12.5 MG tablet Take 2 tablets by mouth daily.     Allergies:   Patient has no known allergies.   Social History   Socioeconomic History   Marital status: Legally Separated    Spouse name: Not on file   Number of children: 2   Years of education: Not on file   Highest education level: Not on file  Occupational History   Occupation: CLIENT SER REP    Employer: [employer]   Occupation: Optician, dispensing    Comment: [employer]  Tobacco Use   Smoking status: Never   Smokeless tobacco: Never  Substance and Sexual Activity   Alcohol use: No    Comment: rare   Drug use: No   Sexual activity: Yes    Partners: Male  Other Topics Concern   Not on file  Social History Narrative   Exercise-- walks in am   Social Drivers of Health   Financial Resource Strain: Not on file  Food Insecurity: No Food Insecurity (02/07/2024)   Hunger Vital Sign    Worried About Running Out of Food in the Last Year: Never true    Ran Out of Food in the Last Year: Never true  Transportation Needs: No Transportation Needs (02/07/2024)   PRAPARE - Administrator, Civil Service (Medical): No    Lack of Transportation  (Non-Medical): No  Physical Activity: Not on file  Stress: Not on file  Social Connections: Not on file     Family History: The patient's family history includes Arthritis in her maternal grandmother and mother; Asthma in an other family member; Breast cancer in her cousin and maternal aunt; Breast cancer (age of onset: 48) in her mother; Cancer in her cousin and mother; Crohn's disease in her sister; Diabetes in her father, mother, and paternal aunt; Heart disease (age of onset: 74) in her maternal grandfather; Hyperlipidemia in her father and mother; Hypertension in her maternal aunt, maternal grandmother, and mother.  ROS:   Review of Systems  Constitution: Negative for decreased appetite, fever and weight gain.  HENT: Negative for congestion, ear discharge, hoarse voice and sore throat.   Eyes: Negative for discharge, redness, vision loss in right eye and visual halos.  Cardiovascular: Negative for chest pain, dyspnea on exertion, leg swelling, orthopnea and palpitations.  Respiratory: Negative for cough, hemoptysis, shortness of breath and snoring.   Endocrine: Negative for heat intolerance and polyphagia.  Hematologic/Lymphatic: Negative for bleeding problem. Does not bruise/bleed easily.  Skin: Negative for flushing, nail changes, rash and suspicious lesions.  Musculoskeletal: Negative for arthritis, joint pain, muscle cramps, myalgias, neck pain and stiffness.  Gastrointestinal: Negative for abdominal pain, bowel incontinence, diarrhea and excessive appetite.  Genitourinary: Negative for decreased libido, genital sores and incomplete emptying.  Neurological: Negative for brief paralysis, focal weakness, headaches and loss of balance.  Psychiatric/Behavioral: Negative for altered mental status, depression and suicidal ideas.  Allergic/Immunologic: Negative for HIV exposure and persistent infections.    EKGs/Labs/Other Studies Reviewed:    The following studies were reviewed  today:   EKG:  The ekg ordered today demonstrates   Recent Labs: 10/17/2023: ALT 14; BUN 12; Creatinine, Ser 1.04; Hemoglobin 13.7; Platelets 234.0; Potassium 4.4; Sodium 137; TSH 3.14  Recent Lipid Panel    Component Value Date/Time   CHOL 188 10/17/2023 0905   TRIG 132.0 10/17/2023 0905   HDL 56.40 10/17/2023 0905   CHOLHDL 3 10/17/2023 0905   VLDL 26.4 10/17/2023 0905   LDLCALC 105 (H) 10/17/2023 0905   LDLCALC 220 (H) 03/10/2021 1410   LDLDIRECT 152.3 07/13/2013 0826    Physical Exam:    VS:  BP (!) 158/80   Pulse 90   Ht 5\' 4"  (1.626 m)   Wt 152 lb 3.2 oz (69 kg)   LMP 02/17/2018   SpO2 99%   BMI 26.13 kg/m     Wt Readings from Last 3 Encounters:  04/10/24 152 lb 3.2 oz (69 kg)  01/14/24 148 lb (67.1 kg)  12/19/23 149 lb 9.6 oz (67.9 kg)     GEN: Well nourished, well developed in no acute distress  HEENT: Normal NECK: No JVD; No carotid bruits LYMPHATICS: No lymphadenopathy CARDIAC: S1S2 noted,RRR, no murmurs, rubs, gallops RESPIRATORY:  Clear to auscultation without rales, wheezing or rhonchi  ABDOMEN: Soft, non-tender, non-distended, +bowel sounds, no guarding. EXTREMITIES: No edema, No cyanosis, no clubbing MUSCULOSKELETAL:  No deformity  SKIN: Warm and dry NEUROLOGIC:  Alert and oriented x 3, non-focal PSYCHIATRIC:  Normal affect, good insight  ASSESSMENT:    1. Mixed hyperlipidemia   2. Type 2 diabetes mellitus with hyperglycemia, with long-term current use of insulin (HCC)   3. Elevated lipoprotein(a)   4. Primary hypertension    PLAN:    1.Hypertension - she is hypertensive in the office today. Her systolic blood pressure at home is also elevated at home. I will stop the lisinopril-hydrochlorothiazide and start Olmersartan-hydrochlorothiazide 20mg  -12.5mg  daily. Hopefully we will soon see improvement/   2. HLN - She is on Crestor. Will get blood work today.   3. DM2 - HbA1c 12 on last lab. Will repeat.   Has follow up with her pcp soon.     The patient is in agreement with the above plan. The patient left the office in stable condition.  The patient will follow up in   Medication Adjustments/Labs and Tests Ordered: Current medicines are reviewed at length with the patient today.  Concerns regarding medicines are outlined above.  Orders Placed This Encounter  Procedures   Lipid panel   Hemoglobin A1c   Meds ordered this encounter  Medications   olmesartan-hydrochlorothiazide (BENICAR HCT) 20-12.5 MG tablet    Sig: Take 1 tablet by mouth daily.    Dispense:  90 tablet    Refill:  3    Patient Instructions  Medication Instructions:  Your physician has recommended you make the following change in your medication:  STOP: Lisinopril START: Olmesartan- Hydrochlorothiazide 20-12.5 mg once daily  Please take your blood pressure daily for 2 weeks and send in a MyChart message. Please include heart rates. (One message at the end of the 2 weeks).   HOW TO TAKE YOUR BLOOD PRESSURE: Rest 5 minutes before taking your blood pressure. Don't smoke or drink caffeinated beverages for at least 30 minutes before. Take your blood pressure before (not after) you eat. Sit comfortably with your back supported and both feet on the floor (don't cross your legs). Elevate your arm to heart level on a table or a desk. Use the proper sized cuff. It should fit smoothly and snugly around your bare upper arm. There should be enough room to slip a fingertip under the cuff. The bottom edge of the cuff should be 1 inch above the crease of the elbow. Ideally, take 3 measurements at one sitting and record the average.  *If you need a refill on your cardiac medications before your next appointment, please call your pharmacy*  Lab Work: Lipids, HgbA1c If you have labs (blood work) drawn today and your tests are completely normal, you will receive your results only by: MyChart Message (if you have MyChart) OR A paper copy in the mail If you have any  lab test that is abnormal or we need to change your treatment, we will call you to review the results.   Follow-Up: At [facility], you and your health needs are our priority.  As part of our continuing mission to provide you with exceptional heart care, our providers are all part of one team.  This team includes your primary Cardiologist (physician) and Advanced Practice Providers or APPs (Physician  Assistants and Nurse Practitioners) who all work together to provide you with the care you need, when you need it.  Your next appointment:   6 month(s)  Provider:   Zacary Bauer, DO     Other Instructions:    1st Floor: - Lobby - Registration  - Pharmacy  - Lab - Cafe  2nd Floor: - PV Lab - Diagnostic Testing (echo, CT, nuclear med)  3rd Floor: - Vacant  4th Floor: - TCTS (cardiothoracic surgery) - AFib Clinic - Structural Heart Clinic - Vascular Surgery  - Vascular Ultrasound  5th Floor: - HeartCare Cardiology (general and EP) - Clinical Pharmacy for coumadin, hypertension, lipid, weight-loss medications, and med management appointments    Valet parking services will be available as well.      Adopting a Healthy Lifestyle.  Know what a healthy weight is for you (roughly BMI <25) and aim to maintain this   Aim for 7+ servings of fruits and vegetables daily   65-80+ fluid ounces of water or unsweet tea for healthy kidneys   Limit to max 1 drink of alcohol per day; avoid smoking/tobacco   Limit animal fats in diet for cholesterol and heart health - choose grass fed whenever available   Avoid highly processed foods, and foods high in saturated/trans fats   Aim for low stress - take time to unwind and care for your mental health   Aim for 150 min of moderate intensity exercise weekly for heart health, and weights twice weekly for bone health   Aim for 7-9 hours of sleep daily   When it comes to diets, agreement about the perfect plan isnt easy to  find, even among the experts. Experts at the [facility] [employer] developed an idea known as the Healthy Eating Plate. Just imagine a plate divided into logical, healthy portions.   The emphasis is on diet quality:   Load up on vegetables and fruits - one-half of your plate: Aim for color and variety, and remember that potatoes dont count.   Go for whole grains - one-quarter of your plate: Whole wheat, barley, wheat berries, quinoa, oats, brown rice, and foods made with them. If you want pasta, go with whole wheat pasta.   Protein power - one-quarter of your plate: Fish, chicken, beans, and nuts are all healthy, versatile protein sources. Limit red meat.   The diet, however, does go beyond the plate, offering a few other suggestions.   Use healthy plant oils, such as olive, canola, soy, corn, sunflower and peanut. Check the labels, and avoid partially hydrogenated oil, which have unhealthy trans fats.   If youre thirsty, drink water. Coffee and tea are good in moderation, but skip sugary drinks and limit milk and dairy products to one or two daily servings.   The type of carbohydrate in the diet is more important than the amount. Some sources of carbohydrates, such as vegetables, fruits, whole grains, and beans-are healthier than others.   Finally, stay active  Signed, Merced Hanners, DO  04/10/2024 1:44 PM    [facility]

## 2024-04-10 NOTE — Patient Instructions (Signed)
 Medication Instructions:  Your physician has recommended you make the following change in your medication:  STOP: Lisinopril START: Olmesartan- Hydrochlorothiazide 20-12.5 mg once daily  Please take your blood pressure daily for 2 weeks and send in a MyChart message. Please include heart rates. (One message at the end of the 2 weeks).   HOW TO TAKE YOUR BLOOD PRESSURE: Rest 5 minutes before taking your blood pressure. Don't smoke or drink caffeinated beverages for at least 30 minutes before. Take your blood pressure before (not after) you eat. Sit comfortably with your back supported and both feet on the floor (don't cross your legs). Elevate your arm to heart level on a table or a desk. Use the proper sized cuff. It should fit smoothly and snugly around your bare upper arm. There should be enough room to slip a fingertip under the cuff. The bottom edge of the cuff should be 1 inch above the crease of the elbow. Ideally, take 3 measurements at one sitting and record the average.  *If you need a refill on your cardiac medications before your next appointment, please call your pharmacy*  Lab Work: Lipids, HgbA1c If you have labs (blood work) drawn today and your tests are completely normal, you will receive your results only by: MyChart Message (if you have MyChart) OR A paper copy in the mail If you have any lab test that is abnormal or we need to change your treatment, we will call you to review the results.   Follow-Up: At [facility], you and your health needs are our priority.  As part of our continuing mission to provide you with exceptional heart care, our providers are all part of one team.  This team includes your primary Cardiologist (physician) and Advanced Practice Providers or APPs (Physician Assistants and Nurse Practitioners) who all work together to provide you with the care you need, when you need it.  Your next appointment:   6 month(s)  Provider:   Thomasene Ripple, DO     Other Instructions:    1st Floor: - Lobby - Registration  - Pharmacy  - Lab - Cafe  2nd Floor: - PV Lab - Diagnostic Testing (echo, CT, nuclear med)  3rd Floor: - Vacant  4th Floor: - TCTS (cardiothoracic surgery) - AFib Clinic - Structural Heart Clinic - Vascular Surgery  - Vascular Ultrasound  5th Floor: - HeartCare Cardiology (general and EP) - Clinical Pharmacy for coumadin, hypertension, lipid, weight-loss medications, and med management appointments    Valet parking services will be available as well.

## 2024-04-11 ENCOUNTER — Encounter: Payer: Self-pay | Admitting: Cardiology

## 2024-04-11 LAB — LIPID PANEL
Chol/HDL Ratio: 3 ratio (ref 0.0–4.4)
Cholesterol, Total: 193 mg/dL (ref 100–199)
HDL: 64 mg/dL (ref >39)
LDL Chol Calc (NIH): 116 mg/dL — ABNORMAL HIGH (ref 0–99)
Triglycerides: 70 mg/dL (ref 0–149)
VLDL Cholesterol Cal: 13 mg/dL (ref 5–40)

## 2024-04-11 LAB — HEMOGLOBIN A1C
Est. average glucose Bld gHb Est-mCnc: 275 mg/dL
Hgb A1c MFr Bld: 11.2 % — ABNORMAL HIGH (ref 4.8–5.6)

## 2024-04-15 ENCOUNTER — Telehealth: Payer: Self-pay

## 2024-04-15 NOTE — Telephone Encounter (Signed)
 Called pt, no answer. Left message for pt to return the call for Dr. Ryan Coyer recommends. See below:  LDL is 116 , ideally would love to see it less than 100. If you are ok with this I would like to add Zetia 10 mg daily to your regimen.  Your Hba1c is 11 ( not much changed since last lab) but I know see Dr. Jalene Mayor next week.

## 2024-04-16 ENCOUNTER — Ambulatory Visit: Payer: Federal, State, Local not specified - PPO | Admitting: Family Medicine

## 2024-04-16 ENCOUNTER — Encounter: Payer: Self-pay | Admitting: Family Medicine

## 2024-04-16 ENCOUNTER — Other Ambulatory Visit (HOSPITAL_COMMUNITY): Payer: Self-pay

## 2024-04-16 ENCOUNTER — Telehealth: Payer: Self-pay | Admitting: Pharmacy Technician

## 2024-04-16 VITALS — BP 142/86 | HR 89 | Temp 98.6°F | Resp 18 | Ht 64.0 in | Wt 155.8 lb

## 2024-04-16 DIAGNOSIS — Z794 Long term (current) use of insulin: Secondary | ICD-10-CM | POA: Diagnosis not present

## 2024-04-16 DIAGNOSIS — E1165 Type 2 diabetes mellitus with hyperglycemia: Secondary | ICD-10-CM | POA: Diagnosis not present

## 2024-04-16 DIAGNOSIS — B351 Tinea unguium: Secondary | ICD-10-CM

## 2024-04-16 DIAGNOSIS — I10 Essential (primary) hypertension: Secondary | ICD-10-CM

## 2024-04-16 DIAGNOSIS — E1169 Type 2 diabetes mellitus with other specified complication: Secondary | ICD-10-CM | POA: Diagnosis not present

## 2024-04-16 DIAGNOSIS — E785 Hyperlipidemia, unspecified: Secondary | ICD-10-CM

## 2024-04-16 LAB — MICROALBUMIN / CREATININE URINE RATIO
Creatinine,U: 172.1 mg/dL
Microalb Creat Ratio: 15.5 mg/g (ref 0.0–30.0)
Microalb, Ur: 2.7 mg/dL — ABNORMAL HIGH (ref 0.0–1.9)

## 2024-04-16 MED ORDER — NOVOLOG MIX 70/30 FLEXPEN (70-30) 100 UNIT/ML ~~LOC~~ SUPN: 30.0000 [IU] | PEN_INJECTOR | Freq: Two times a day (BID) | SUBCUTANEOUS | 2 refills | Status: DC

## 2024-04-16 MED ORDER — FREESTYLE LIBRE 3 SENSOR MISC: 1 refills | Status: AC

## 2024-04-16 MED ORDER — CICLOPIROX 8 % EX SOLN: Freq: Every day | CUTANEOUS | 2 refills | Status: AC

## 2024-04-16 MED ORDER — TIRZEPATIDE 2.5 MG/0.5ML ~~LOC~~ SOAJ: 2.5000 mg | SUBCUTANEOUS | 3 refills | Status: DC

## 2024-04-16 NOTE — Assessment & Plan Note (Signed)
 Well controlled, no changes to meds. Encouraged heart healthy diet such as the DASH diet and exercise as tolerated.

## 2024-04-16 NOTE — Assessment & Plan Note (Signed)
 Encourage heart healthy diet such as MIND or DASH diet, increase exercise, avoid trans fats, simple carbohydrates and processed foods, consider a krill or fish or flaxseed oil cap daily.

## 2024-04-16 NOTE — Progress Notes (Signed)
 Established Patient Office Visit  Subjective   Patient ID: Angelyne Terwilliger, female    DOB: 03/23/1966  Age: 58 y.o. MRN: [id number]  Chief Complaint  Patient presents with   Diabetes   Hypertension   Hyperlipidemia   Follow-up    HPI Discussed the use of AI scribe software for clinical note transcription with the patient, who gave verbal consent to proceed.  History of Present Illness Ia Leeb is a 58 year old female with diabetes and hyperlipidemia who presents for follow-up on her diabetes management and cholesterol levels.  She experiences an itchy, burning sensation in her right hand, with occasional cramping while typing. She uses gel pads at home to alleviate symptoms. 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 No significant pain in the right hand.  She has a history of diabetes and is currently taking NovoLog insulin twice daily with meals. Her A1c was reported to be 11.6, down from a previous 12.2, but she acknowledges it was better when it was around 9. She has used a continuous glucose monitor (Libre) in the past but found the alerts disruptive. No recent changes in vision.  She has hyperlipidemia and is currently on Crestor. She was informed about the addition of Zetia to her regimen. She was recently switched from lisinopril to olmesartan for blood pressure management, but has not yet started the new medication. Her blood pressure is monitored at home and recorded. No swelling in the ankles.  She has a family history of diabetes and cardiovascular issues. Her father, who is 13, has had strokes and diabetes, and her twin sister has Crohn's disease and was advised to watch her cholesterol. Her mother had breast cancer and passed away in 05/16/2012.  She has undergone extensive cardiovascular testing, including a CT scan for calcium scoring, which was zero, and other tests that returned normal results. She has high  LP(a) levels, which are genetic.  She has received the pneumonia vaccine in 16-May-2021 and regularly visits the eye doctor, with her last visit possibly in December.   Patient Active Problem List   Diagnosis Date Noted   Left wrist pain 07/05/2023   Acne vulgaris 07/05/2023   Preventative health care 03/30/2022   Type 2 diabetes mellitus with hyperglycemia, with long-term current use of insulin (HCC) 11/17/2018   Hyperlipidemia associated with type 2 diabetes mellitus (HCC) 11/17/2018   Hyperlipidemia LDL goal <100 05/05/2018   Cellulitis and abscess of digit 07/06/2013   Grief reaction 11/03/2012   CELLULITIS, FINGER 01/09/2011   Acute upper respiratory infection 12/19/2010   Essential hypertension 09/21/2010   Diabetes mellitus type II, uncontrolled 06/19/2010   HYPERGLYCEMIA, FASTING 06/19/2010   POSTTRAUMATIC STRESS DISORDER 05/16/2010   COUGH 01/25/2009   ACUTE SINUSITIS, UNSPECIFIED 08/30/2008   Allergic rhinitis 08/27/2008   CONTUSION OF BUTTOCK 07/20/2008   Past Medical History:  Diagnosis Date   Abortion history 1995   G3P2012   Allergic rhinitis    Asthma    Diabetes mellitus type II    Gestational diabetes mellitus May 16, 2000   Hyperlipidemia    Hypertension    Past Surgical History:  Procedure Laterality Date   CERVICAL BIOPSY  05-17-99   TUBAL LIGATION  16-May-2000   Social History   Tobacco Use   Smoking status: Never   Smokeless tobacco: Never  Substance Use Topics   Alcohol use: No    Comment: rare   Drug use: No   Social History  Socioeconomic History   Marital status: Widowed    Spouse name: Not on file   Number of children: 2   Years of education: Not on file   Highest education level: Not on file  Occupational History   Occupation: CLIENT SER REP    Employer: [employer]   Occupation: Optician, dispensing    Comment: [employer]  Tobacco Use   Smoking status: Never   Smokeless tobacco: Never  Substance and Sexual Activity   Alcohol use: No     Comment: rare   Drug use: No   Sexual activity: Yes    Partners: Male  Other Topics Concern   Not on file  Social History Narrative   Exercise-- walks in am   Social Drivers of Health   Financial Resource Strain: Not on file  Food Insecurity: No Food Insecurity (02/07/2024)   Hunger Vital Sign    Worried About Running Out of Food in the Last Year: Never true    Ran Out of Food in the Last Year: Never true  Transportation Needs: No Transportation Needs (02/07/2024)   PRAPARE - Administrator, Civil Service (Medical): No    Lack of Transportation (Non-Medical): No  Physical Activity: Not on file  Stress: Not on file  Social Connections: Not on file  Intimate Partner Violence: Not At Risk (02/07/2024)   Humiliation, Afraid, Rape, and Kick questionnaire    Fear of Current or Ex-Partner: No    Emotionally Abused: No    Physically Abused: No    Sexually Abused: No   Family Status  Relation Name Status   Mother  Deceased at age 27       breast cancer , kidney cancer   Father  Alive   Sister  Alive   MGM  (Not Specified)   MGF  (Not Specified)   Mat Aunt  (Not Specified)   Emelda Brothers  (Not Specified)   Cousin  Deceased   Other  (Not Specified)  No partnership data on file   Family History  Problem Relation Age of Onset   Hypertension Mother    Arthritis Mother    Breast cancer Mother 3   Cancer Mother        breast   Diabetes Mother    Hyperlipidemia Mother    Stroke Father    Diabetes Father    Hyperlipidemia Father    Hyperlipidemia Sister    Crohn's disease Sister        twin   Hypertension Maternal Grandmother    Arthritis Maternal Grandmother    Heart disease Maternal Grandfather 109       mi   Hypertension Maternal Aunt    Breast cancer Maternal Aunt    Diabetes Paternal Aunt    Breast cancer Cousin    Cancer Cousin    Asthma Other        children   No Known Allergies  `1  Review of Systems  Constitutional:  Negative for chills, fever and  malaise/fatigue.  HENT:  Negative for congestion and hearing loss.   Eyes:  Negative for blurred vision and discharge.  Respiratory:  Negative for cough, sputum production and shortness of breath.   Cardiovascular:  Negative for chest pain, palpitations and leg swelling.  Gastrointestinal:  Negative for abdominal pain, blood in stool, constipation, diarrhea, heartburn, nausea and vomiting.  Genitourinary:  Negative for dysuria, frequency, hematuria and urgency.  Musculoskeletal:  Negative for back pain, falls and myalgias.  Skin:  Negative  for rash.  Neurological:  Negative for dizziness, sensory change, loss of consciousness, weakness and headaches.  Endo/Heme/Allergies:  Negative for environmental allergies. Does not bruise/bleed easily.  Psychiatric/Behavioral:  Negative for depression and suicidal ideas. The patient is not nervous/anxious and does not have insomnia.       Objective:     BP (!) 142/86 (BP Location: Right Arm, Patient Position: Sitting, Cuff Size: Normal)   Pulse 89   Temp 98.6 F (37 C) (Oral)   Resp 18   Ht 5\' 4"  (1.626 m)   Wt 155 lb 12.8 oz (70.7 kg)   LMP 02/17/2018   SpO2 99%   BMI 26.74 kg/m  BP Readings from Last 3 Encounters:  04/16/24 (!) 142/86  04/10/24 (!) 158/80  01/14/24 139/78   Wt Readings from Last 3 Encounters:  04/16/24 155 lb 12.8 oz (70.7 kg)  04/10/24 152 lb 3.2 oz (69 kg)  01/14/24 148 lb (67.1 kg)   SpO2 Readings from Last 3 Encounters:  04/16/24 99%  04/10/24 99%  12/19/23 97%    Physical Exam Vitals and nursing note reviewed.  Constitutional:      General: She is not in acute distress.    Appearance: Normal appearance. She is well-developed.  HENT:     Head: Normocephalic and atraumatic.     Right Ear: Tympanic membrane, ear canal and external ear normal. There is no impacted cerumen.     Left Ear: Tympanic membrane, ear canal and external ear normal. There is no impacted cerumen.     Nose: Nose normal.      Mouth/Throat:     Mouth: Mucous membranes are moist.     Pharynx: Oropharynx is clear. No oropharyngeal exudate or posterior oropharyngeal erythema.  Eyes:     General: No scleral icterus.       Right eye: No discharge.        Left eye: No discharge.     Conjunctiva/sclera: Conjunctivae normal.     Pupils: Pupils are equal, round, and reactive to light.  Neck:     Thyroid: No thyromegaly or thyroid tenderness.     Vascular: No JVD.  Cardiovascular:     Rate and Rhythm: Normal rate and regular rhythm.     Heart sounds: Normal heart sounds. No murmur heard. Pulmonary:     Effort: Pulmonary effort is normal. No respiratory distress.     Breath sounds: Normal breath sounds.  Abdominal:     General: Bowel sounds are normal. There is no distension.     Palpations: Abdomen is soft. There is no mass.     Tenderness: There is no abdominal tenderness. There is no guarding or rebound.  Genitourinary:    Vagina: Normal.  Musculoskeletal:        General: Normal range of motion.     Cervical back: Normal range of motion and neck supple.     Right lower leg: No edema.     Left lower leg: No edema.  Lymphadenopathy:     Cervical: No cervical adenopathy.  Skin:    General: Skin is warm and dry.     Findings: No erythema or rash.  Neurological:     Mental Status: She is alert and oriented to person, place, and time.     Cranial Nerves: No cranial nerve deficit.     Deep Tendon Reflexes: Reflexes are normal and symmetric.  Psychiatric:        Mood and Affect: Mood normal.  Behavior: Behavior normal.        Thought Content: Thought content normal.        Judgment: Judgment normal.      No results found for any visits on 04/16/24.  Last CBC Lab Results  Component Value Date   WBC 4.3 10/17/2023   HGB 13.7 10/17/2023   HCT 43.1 10/17/2023   MCV 79.1 10/17/2023   MCH 25.2 (L) 03/10/2021   RDW 13.9 10/17/2023   PLT 234.0 10/17/2023   Last metabolic panel Lab Results   Component Value Date   GLUCOSE 300 (H) 10/17/2023   NA 137 10/17/2023   K 4.4 10/17/2023   CL 96 10/17/2023   CO2 32 10/17/2023   BUN 12 10/17/2023   CREATININE 1.04 10/17/2023   GFR 59.60 (L) 10/17/2023   CALCIUM 9.9 10/17/2023   PROT 7.1 10/17/2023   ALBUMIN 4.3 10/17/2023   BILITOT 0.5 10/17/2023   ALKPHOS 85 10/17/2023   AST 13 10/17/2023   ALT 14 10/17/2023   Last lipids Lab Results  Component Value Date   CHOL 193 04/10/2024   HDL 64 04/10/2024   LDLCALC 116 (H) 04/10/2024   LDLDIRECT 152.3 07/13/2013   TRIG 70 04/10/2024   CHOLHDL 3.0 04/10/2024   Last hemoglobin A1c Lab Results  Component Value Date   HGBA1C 11.2 (H) 04/10/2024   Last thyroid functions Lab Results  Component Value Date   TSH 3.14 10/17/2023   Last vitamin D No results found for: "25OHVITD2", "25OHVITD3", "VD25OH" Last vitamin B12 and Folate Lab Results  Component Value Date   VITAMINB12 558 04/16/2023      The 10-year ASCVD risk score (Arnett DK, et al., 2019) is: 16.3%    Assessment & Plan:   Problem List Items Addressed This Visit       Unprioritized   Type 2 diabetes mellitus with hyperglycemia, with long-term current use of insulin (HCC) - Primary   hgba1c to be checked  minimize simple carbs. Increase exercise as tolerated. Continue current meds She has not see endo in a while-- she will schedule a f/u      Relevant Medications   insulin aspart protamine - aspart (NOVOLOG MIX 70/30 FLEXPEN) (70-30) 100 UNIT/ML FlexPen   Continuous Glucose Sensor (FREESTYLE LIBRE 3 SENSOR) MISC   tirzepatide (MOUNJARO) 2.5 MG/0.5ML Pen   Other Relevant Orders   Microalbumin / creatinine urine ratio   Hyperlipidemia associated with type 2 diabetes mellitus (HCC)   Encourage heart healthy diet such as MIND or DASH diet, increase exercise, avoid trans fats, simple carbohydrates and processed foods, consider a krill or fish or flaxseed oil cap daily.        Relevant Medications    insulin aspart protamine - aspart (NOVOLOG MIX 70/30 FLEXPEN) (70-30) 100 UNIT/ML FlexPen   tirzepatide (MOUNJARO) 2.5 MG/0.5ML Pen   Essential hypertension   Well controlled, no changes to meds. Encouraged heart healthy diet such as the DASH diet and exercise as tolerated.        Other Visit Diagnoses       Onychomycosis       Relevant Medications   ciclopirox (PENLAC) 8 % solution     Assessment and Plan Assessment & Plan Diabetes Mellitus Type 2   Her diabetes remains uncontrolled with an A1c of 11.6, down from 12.2. Currently, she uses NovoLog insulin twice daily. Discussed hyperglycemia risks, including myocardial infarction and cerebrovascular accident. She agreed to try [facility] for better glycemic control and weight loss, despite concerns about  side effects. Mounjaro will be administered once weekly via pen, starting at the lowest dose with potential dose escalation for increased efficacy. Prescribe Mounjaro, monitor glucose with a continuous glucose monitor, and encourage follow-up with an endocrinologist.  Hyperlipidemia   She has elevated cholesterol, focusing on lowering LDL, with a genetic predisposition to hypercholesterolemia. Previously treated with Crestor, now adding Zetia. Emphasized the importance of lipid management to prevent cardiovascular complications, considering her genetic predisposition and family history. Continue Crestor and add Zetia to her regimen.  Hypertension   Her blood pressure is slightly elevated today. Previous treatment with lisinopril has been changed to olmesartan, which she has not yet started. Discussed the importance of blood pressure control in the context of her overall cardiovascular risk profile. Start olmesartan as prescribed.  General Health Maintenance   She received a pneumonia vaccine in 2022 and maintains regular eye exams. Ensure her pneumonia vaccine is up to date every five years and continue regular eye exams.    No follow-ups  on file.    Areli Frary R Lowne Chase, DO

## 2024-04-16 NOTE — Assessment & Plan Note (Signed)
 hgba1c to be checked  minimize simple carbs. Increase exercise as tolerated. Continue current meds She has not see endo in a while-- she will schedule a f/u

## 2024-04-16 NOTE — Telephone Encounter (Signed)
 Pharmacy Patient Advocate Encounter   Received notification from CoverMyMeds that prior authorization for Mounjaro 2.5MG /0.5ML auto-injectors is required/requested.   Insurance verification completed.   The patient is insured through [employer] .   Per test claim: PA required; PA submitted to above mentioned insurance via CoverMyMeds Key/confirmation #/EOC [id number] Status is pending

## 2024-04-16 NOTE — Telephone Encounter (Signed)
 Pharmacy Patient Advocate Encounter  Received notification from [facility] that Prior Authorization for Mounjaro 2.5MG /0.5ML auto-injectors has been APPROVED from 04/16/2024 to 04/16/2025. Unable to obtain price due to refill too soon rejection, last fill date 04/16/2024 next available fill date05/07/2024.   PA #/Case ID/Reference #: Key: [id number]

## 2024-04-28 ENCOUNTER — Other Ambulatory Visit: Payer: Self-pay

## 2024-04-28 MED ORDER — EZETIMIBE 10 MG PO TABS
10.0000 mg | ORAL_TABLET | Freq: Every day | ORAL | 3 refills | Status: DC
Start: 2024-04-28 — End: 2024-11-03

## 2024-06-01 ENCOUNTER — Telehealth: Payer: Self-pay | Admitting: Pharmacist

## 2024-06-01 NOTE — Telephone Encounter (Signed)
 Patient was identified as falling into the [place] Measure - Diabetes.   Outreached patient to see how she was doing with Mounjaro 2.5mg  weekly - looks like she filled for the first time 04/22/2024 but not since.  Also started Libre Continuous Glucose sensors.  Reached patient but she was in a [employer]. At her request, sent her a MyChart message with my contact information and times that we might be able to discuss her medications and diabetes further.

## 2024-06-11 ENCOUNTER — Ambulatory Visit: Payer: Federal, State, Local not specified - PPO | Admitting: Dermatology

## 2024-06-11 ENCOUNTER — Encounter: Payer: Self-pay | Admitting: Dermatology

## 2024-06-11 VITALS — BP 171/98 | HR 98

## 2024-06-11 DIAGNOSIS — L308 Other specified dermatitis: Secondary | ICD-10-CM

## 2024-06-11 DIAGNOSIS — E7800 Pure hypercholesterolemia, unspecified: Secondary | ICD-10-CM | POA: Insufficient documentation

## 2024-06-11 DIAGNOSIS — L309 Dermatitis, unspecified: Secondary | ICD-10-CM

## 2024-06-11 DIAGNOSIS — W57XXXA Bitten or stung by nonvenomous insect and other nonvenomous arthropods, initial encounter: Secondary | ICD-10-CM

## 2024-06-11 DIAGNOSIS — Z794 Long term (current) use of insulin: Secondary | ICD-10-CM | POA: Insufficient documentation

## 2024-06-11 DIAGNOSIS — E139 Other specified diabetes mellitus without complications: Secondary | ICD-10-CM | POA: Insufficient documentation

## 2024-06-11 DIAGNOSIS — Z87828 Personal history of other (healed) physical injury and trauma: Secondary | ICD-10-CM | POA: Diagnosis not present

## 2024-06-11 DIAGNOSIS — E039 Hypothyroidism, unspecified: Secondary | ICD-10-CM | POA: Insufficient documentation

## 2024-06-11 MED ORDER — TRIAMCINOLONE ACETONIDE 0.1 % EX OINT: 1.0000 | TOPICAL_OINTMENT | Freq: Two times a day (BID) | CUTANEOUS | 5 refills | Status: AC

## 2024-06-11 NOTE — Progress Notes (Signed)
 New Patient Visit   Subjective  Paige Fleming is a 58 y.o. female who presents for the following: Rash  The patient is a woman with a history of eczema  presenting for evaluation of recurrent rashes. She reports two significant episodes of rash associated with stress from family deaths.  The first episode occurred 4 years ago, manifesting as an itchy, red, burning rash on her arm that lasted 1-2 weeks. The most recent episode presented similarly but affected her cheek. Both rashes resolved within about a month. The patient describes the rashes as very itchy and initially thought the recent episode might be shingles, but was told it lacked the characteristic blisters and pain. She has pictures documenting these rashes.  The patient also mentions being prone to mosquito bites, which can lead to inflammation and dark spots on her skin. She showed a dark spot from a previous bite that developed into a ball of scar tissue.   Additionally, the patient reports a history of eczema in herself and her daughter. She previously saw an allergy doctor and a dermatologist for this condition, but her long-time dermatologist passed away years ago.  The following portions of the chart were reviewed this encounter and updated as appropriate: medications, allergies, medical history  Review of Systems:  No other skin or systemic complaints except as noted in HPI or Assessment and Plan.  Objective  Well appearing patient in no apparent distress; mood and affect are within normal limits.  A focused examination was performed of the following areas: Face  Relevant exam findings are noted in the Assessment and Plan.    Assessment & Plan   1. Stress-induced dermatitis - Assessment:  Patient reports history of recurrent rashes associated with stressful life events, specifically the deaths of family members. The most recent episode occurred on the cheek, while a previous episode 4 years ago affected  the arm. The rashes are described as red, itchy, and burning, lasting 1-2 weeks. Differential diagnosis initially included shingles, but this was ruled out due to the absence of blisters and characteristic pain. The linear pattern observed in photos suggests possible flagellate erythema, potentially related to shiitake mushroom consumption in the patient's vegan diet.  - Plan:    For future episodes:     - Contact the office promptly via MyChart     - Consider topical steroid cream (e.g., triamcinolone ) for inflammation     - Possible oral steroid if severe, to be determined at time of occurrence    Patient education on stress management techniques provided    Follow up as needed for any new rash occurrences  2. Mosquito bite hypersensitivity - Assessment: Patient reports being prone to mosquito bites with a tendency to develop post-inflammatory hyperpigmentation. A previous bite resulted in a ball of scar tissue, indicating a history of significant local reactions to insect bites.  - Plan:    Prescribe triamcinolone  cream for immediate application to mosquito bites    Patient education provided on proper use of triamcinolone :     - Apply immediately after bites occur     - Use for no more than 2 weeks consecutively in one location     - For chronic use, follow 2 weeks on, 2 weeks off regimen    Emphasize importance of sun protection to prevent further hyperpigmentation    Provide samples of face washes for skin care BUG BITE, INITIAL ENCOUNTER   Related Medications triamcinolone  ointment (KENALOG ) 0.1 % Apply 1 Application topically 2 (two)  times daily. Apply 2 times daily for 2 weeks on bug bites  Return if symptoms worsen or fail to improve, for Rash F/U.  I, Jetta Ager, am acting as Neurosurgeon for [employer], DO.  Documentation: I have reviewed the above documentation for accuracy and completeness, and I agree with the above.  Louana Roup, DO

## 2024-06-11 NOTE — Patient Instructions (Addendum)
 Date: Thu Jun 11 2024  Hello Ms. Paige Fleming,  Thank you for visiting today. Here is a summary of the key instructions:  - Medications: Apply triamcinolone cream to any rashes or itching   - Use for up to 2 weeks at a time   - Take a 2-week break before using again if needed  - Skin Care:    - Use sunscreen daily, even on brown skin   - Try the provided face wash samples  - Mosquito Bites: Apply triamcinolone cream right away to prevent dark spots  - Follow-up:   - Use MyChart app for any questions or concerns   - Send messages or pictures through MyChart if needed  Please reach out if you have any questions or concerns.  Warm regards,  Dr. Louana Roup, Dermatology     Important Information   Due to recent changes in healthcare laws, you may see results of your pathology and/or laboratory studies on MyChart before the doctors have had a chance to review them. We understand that in some cases there may be results that are confusing or concerning to you. Please understand that not all results are received at the same time and often the doctors may need to interpret multiple results in order to provide you with the best plan of care or course of treatment. Therefore, we ask that you please give us  2 business days to thoroughly review all your results before contacting the office for clarification. Should we see a critical lab result, you will be contacted sooner.     If You Need Anything After Your Visit   If you have any questions or concerns for your doctor, please call our main line at [phone]. If no one answers, please leave a voicemail as directed and we will return your call as soon as possible. Messages left after 4 pm will be answered the following business day.    You may also send us  a message via MyChart. We typically respond to MyChart messages within 1-2 business days.  For prescription refills, please ask your pharmacy to contact our office. Our fax number is  [phone].  If you have an urgent issue when the clinic is closed that cannot wait until the next business day, you can page your doctor at the number below.     Please note that while we do our best to be available for urgent issues outside of office hours, we are not available 24/7.    If you have an urgent issue and are unable to reach us , you may choose to seek medical care at your doctor's office, retail clinic, urgent care center, or emergency room.   If you have a medical emergency, please immediately call 911 or go to the emergency department. In the event of inclement weather, please call our main line at [phone] for an update on the status of any delays or closures.  Dermatology Medication Tips: Please keep the boxes that topical medications come in in order to help keep track of the instructions about where and how to use these. Pharmacies typically print the medication instructions only on the boxes and not directly on the medication tubes.   If your medication is too expensive, please contact our office at [phone] or send us  a message through MyChart.    We are unable to tell what your co-pay for medications will be in advance as this is different depending on your insurance coverage. However, we may be able to find a  substitute medication at lower cost or fill out paperwork to get insurance to cover a needed medication.    If a prior authorization is required to get your medication covered by your insurance company, please allow us  1-2 business days to complete this process.   Drug prices often vary depending on where the prescription is filled and some pharmacies may offer cheaper prices.   The website www.goodrx.com contains coupons for medications through different pharmacies. The prices here do not account for what the cost may be with help from insurance (it may be cheaper with your insurance), but the website can give you the price if you did not use any  insurance.  - You can print the associated coupon and take it with your prescription to the pharmacy.  - You may also stop by our office during regular business hours and pick up a GoodRx coupon card.  - If you need your prescription sent electronically to a different pharmacy, notify our office through [facility] or by phone at [phone]

## 2024-07-29 ENCOUNTER — Telehealth: Payer: Self-pay

## 2024-07-29 NOTE — Telephone Encounter (Signed)
 Patient was identified as falling into the [place] Measure - Diabetes.   Patient was: Appointment already scheduled for:  f/u with PCP.

## 2024-08-17 ENCOUNTER — Other Ambulatory Visit: Payer: Self-pay | Admitting: Family Medicine

## 2024-08-17 DIAGNOSIS — E1165 Type 2 diabetes mellitus with hyperglycemia: Secondary | ICD-10-CM

## 2024-08-17 NOTE — Telephone Encounter (Unsigned)
 Copied from CRM #[id number]. Topic: Clinical - Medication Refill >> Aug 17, 2024  4:00 PM Chiquita SQUIBB wrote: Medication:  NovoLOG  Mix 70/30 FlexPen insulin  aspart protamine - aspart (NOVOLOG  MIX 70/30 FLEXPEN) (70-30) 100 UNIT/ML FlexPen   Has the patient contacted their pharmacy? Yes- Patient's pharmacy told her the medication was denied by the doctor.  (Agent: If no, request that the patient contact the pharmacy for the refill. If patient does not wish to contact the pharmacy document the reason why and proceed with request.) (Agent: If yes, when and what did the pharmacy advise?)  This is the patient's preferred pharmacy:  [id number] [place], [place] - [address] [place] [address] Phone: [phone] Fax: [phone]   Is this the correct pharmacy for this prescription? Yes If no, delete pharmacy and type the correct one.   Has the prescription been filled recently? No  Is the patient out of the medication? Yes  Has the patient been seen for an appointment in the last year OR does the patient have an upcoming appointment? Yes  Can we respond through MyChart? Yes  Agent: Please be advised that Rx refills may take up to 3 business days. We ask that you follow-up with your pharmacy.

## 2024-08-18 MED ORDER — NOVOLOG MIX 70/30 FLEXPEN (70-30) 100 UNIT/ML ~~LOC~~ SUPN
30.0000 [IU] | PEN_INJECTOR | Freq: Two times a day (BID) | SUBCUTANEOUS | 2 refills | Status: AC
Start: 2024-08-18 — End: ?

## 2024-09-28 NOTE — Progress Notes (Signed)
 Hilde Churchman                                          MRN: [id number]   09/28/2024   The [facility] Quality Team Specialist reviewed this patient medical record for the purposes of chart review for care gap closure. The following were reviewed: chart review for care gap closure-kidney health evaluation for diabetes:eGFR . Patient needs egfr for gap closure, upcoming appt 10/16/2024    [facility] Quality Team

## 2024-10-16 ENCOUNTER — Encounter: Payer: Self-pay | Admitting: Family Medicine

## 2024-10-16 ENCOUNTER — Ambulatory Visit: Admitting: Family Medicine

## 2024-10-16 VITALS — BP 142/80 | HR 100 | Temp 98.4°F | Resp 18 | Ht 64.0 in | Wt 160.6 lb

## 2024-10-16 DIAGNOSIS — E1165 Type 2 diabetes mellitus with hyperglycemia: Secondary | ICD-10-CM | POA: Diagnosis not present

## 2024-10-16 DIAGNOSIS — E1169 Type 2 diabetes mellitus with other specified complication: Secondary | ICD-10-CM

## 2024-10-16 DIAGNOSIS — Z0000 Encounter for general adult medical examination without abnormal findings: Secondary | ICD-10-CM

## 2024-10-16 DIAGNOSIS — I10 Essential (primary) hypertension: Secondary | ICD-10-CM

## 2024-10-16 DIAGNOSIS — E785 Hyperlipidemia, unspecified: Secondary | ICD-10-CM

## 2024-10-16 DIAGNOSIS — Z794 Long term (current) use of insulin: Secondary | ICD-10-CM

## 2024-10-16 DIAGNOSIS — E538 Deficiency of other specified B group vitamins: Secondary | ICD-10-CM

## 2024-10-16 LAB — CBC WITH DIFFERENTIAL/PLATELET
Basophils Absolute: 0 K/uL (ref 0.0–0.1)
Basophils Relative: 0.7 % (ref 0.0–3.0)
Eosinophils Absolute: 0.1 K/uL (ref 0.0–0.7)
Eosinophils Relative: 2 % (ref 0.0–5.0)
HCT: 41.3 % (ref 36.0–46.0)
Hemoglobin: 13.5 g/dL (ref 12.0–15.0)
Lymphocytes Relative: 28.7 % (ref 12.0–46.0)
Lymphs Abs: 1.4 K/uL (ref 0.7–4.0)
MCHC: 32.6 g/dL (ref 30.0–36.0)
MCV: 76.6 fl — ABNORMAL LOW (ref 78.0–100.0)
Monocytes Absolute: 0.3 K/uL (ref 0.1–1.0)
Monocytes Relative: 7.2 % (ref 3.0–12.0)
Neutro Abs: 2.9 K/uL (ref 1.4–7.7)
Neutrophils Relative %: 61.4 % (ref 43.0–77.0)
Platelets: 231 K/uL (ref 150.0–400.0)
RBC: 5.4 Mil/uL — ABNORMAL HIGH (ref 3.87–5.11)
RDW: 14.7 % (ref 11.5–15.5)
WBC: 4.7 K/uL (ref 4.0–10.5)

## 2024-10-16 LAB — COMPREHENSIVE METABOLIC PANEL WITH GFR
ALT: 17 U/L (ref 0–35)
AST: 14 U/L (ref 0–37)
Albumin: 4.3 g/dL (ref 3.5–5.2)
Alkaline Phosphatase: 85 U/L (ref 39–117)
BUN: 18 mg/dL (ref 6–23)
CO2: 32 meq/L (ref 19–32)
Calcium: 9.5 mg/dL (ref 8.4–10.5)
Chloride: 99 meq/L (ref 96–112)
Creatinine, Ser: 1.1 mg/dL (ref 0.40–1.20)
GFR: 55.33 mL/min — ABNORMAL LOW (ref >60.00)
Glucose, Bld: 177 mg/dL — ABNORMAL HIGH (ref 70–99)
Potassium: 4.1 meq/L (ref 3.5–5.1)
Sodium: 139 meq/L (ref 135–145)
Total Bilirubin: 0.3 mg/dL (ref 0.2–1.2)
Total Protein: 7 g/dL (ref 6.0–8.3)

## 2024-10-16 LAB — LIPID PANEL
Cholesterol: 276 mg/dL — ABNORMAL HIGH (ref 0–200)
HDL: 55.2 mg/dL (ref >39.00)
LDL Cholesterol: 196 mg/dL — ABNORMAL HIGH (ref 0–99)
NonHDL: 220.5
Total CHOL/HDL Ratio: 5
Triglycerides: 124 mg/dL (ref 0.0–149.0)
VLDL: 24.8 mg/dL (ref 0.0–40.0)

## 2024-10-16 LAB — HEMOGLOBIN A1C: Hgb A1c MFr Bld: 11.5 % — ABNORMAL HIGH (ref 4.6–6.5)

## 2024-10-16 LAB — VITAMIN B12: Vitamin B-12: 492 pg/mL (ref 211–911)

## 2024-10-16 LAB — TSH: TSH: 1.59 u[IU]/mL (ref 0.35–5.50)

## 2024-10-16 NOTE — Assessment & Plan Note (Signed)
 Hgba1c to be checked , minimize simple carbs. Increase exercise as tolerated. Continue current meds

## 2024-10-16 NOTE — Assessment & Plan Note (Signed)
 Encourage heart healthy diet such as MIND or DASH diet, increase exercise, avoid trans fats, simple carbohydrates and processed foods, consider a krill or fish or flaxseed oil cap daily.

## 2024-10-16 NOTE — Progress Notes (Signed)
 Subjective:    Patient ID: Paige Fleming, female    DOB: [birth year], 58 y.o.   MRN: [id number]  Chief Complaint  Patient presents with   Annual Exam    Pt states not fasting     HPI Patient is in today for cpe.  Discussed the use of AI scribe software for clinical note transcription with the patient, who gave verbal consent to proceed.  History of Present Illness Paige Fleming is a 58 year old female who presents for medication review and management of blood pressure and cholesterol.  Approximately six months ago, her blood pressure and cholesterol medications were changed by another provider. She did not use Magerdal as prescribed due to concerns about side effects. She experienced a skin reaction to the adhesive of a discontinued medication patch, resulting in a ring-like rash lasting two weeks.  She visited a dermatologist in July after waiting a year for the appointment. She received samples of facial cleansers and moisturizers, which she has been using and finds effective. She discussed with the dermatologist whether shiitake mushrooms could be related to her rash, but she does not consume them.  She experienced two falls in recent months. The first occurred when she missed the last two steps while running from her bedroom to the kitchen, resulting in a cracked nail. The second fall happened in the kitchen when she slipped on water while wearing flip-flops. No significant injuries were reported from these incidents. She did not experience dizziness during the falls and did not report any significant injuries.  Her blood sugar levels have been fluctuating, with a reading of 168 mg/dL this morning. She has not started Mounjaro  due to concerns about side effects, including weight loss and potential impacts on her kidneys and pancreas. She has gained five pounds recently and plans to increase her physical activity.  She received a pneumonia vaccine three years ago and  completed the shingles vaccine series. She is unsure about the need for additional pneumonia vaccination.  She experiences ear wax buildup, particularly in the left ear, which causes discomfort. She has used over-the-counter drops in the past to alleviate symptoms.  She is widowed, not currently dating, and works for the [employer]. She plans to retire at 29. She experiences stress related to work, particularly due to overtime demands.    Past Medical History:  Diagnosis Date   Abortion history 1995   G3P2012   Allergic rhinitis    Asthma    Diabetes mellitus type II    Gestational diabetes mellitus 2001   Hyperlipidemia    Hypertension     Past Surgical History:  Procedure Laterality Date   CERVICAL BIOPSY  2000   TUBAL LIGATION  2001    Family History  Problem Relation Age of Onset   Hypertension Mother    Arthritis Mother    Breast cancer Mother 3   Cancer Mother        breast   Diabetes Mother    Hyperlipidemia Mother    Stroke Father    Diabetes Father    Hyperlipidemia Father    Hyperlipidemia Sister    Crohn's disease Sister        twin   Hypertension Maternal Grandmother    Arthritis Maternal Grandmother    Heart disease Maternal Grandfather 6       mi   Hypertension Maternal Aunt    Breast cancer Maternal Aunt    Diabetes Paternal Aunt    Breast cancer Cousin    Cancer  Cousin    Asthma Other        children    Social History   Socioeconomic History   Marital status: Widowed    Spouse name: Not on file   Number of children: 2   Years of education: Not on file   Highest education level: Not on file  Occupational History   Occupation: CLIENT SER REP    Employer: [employer]   Occupation: Optician, dispensing    Comment: [employer]  Tobacco Use   Smoking status: Never   Smokeless tobacco: Never  Substance and Sexual Activity   Alcohol use: No    Comment: rare   Drug use: No   Sexual activity: Yes    Partners: Male  Other Topics  Concern   Not on file  Social History Narrative   Exercise-- walks in am   Social Drivers of Health   Financial Resource Strain: Not on file  Food Insecurity: No Food Insecurity (02/07/2024)   Hunger Vital Sign    Worried About Running Out of Food in the Last Year: Never true    Ran Out of Food in the Last Year: Never true  Transportation Needs: No Transportation Needs (02/07/2024)   PRAPARE - Administrator, Civil Service (Medical): No    Lack of Transportation (Non-Medical): No  Physical Activity: Not on file  Stress: Not on file  Social Connections: Not on file  Intimate Partner Violence: Not At Risk (02/07/2024)   Humiliation, Afraid, Rape, and Kick questionnaire    Fear of Current or Ex-Partner: No    Emotionally Abused: No    Physically Abused: No    Sexually Abused: No    Outpatient Medications Prior to Visit  Medication Sig Dispense Refill   ciclopirox  (PENLAC ) 8 % solution Apply topically daily. 6.6 mL 2   clindamycin  (CLEOCIN  T) 1 % lotion APPLY TOPICALLY TWICE A DAY 60 mL 0   Continuous Glucose Sensor (FREESTYLE LIBRE 3 SENSOR) MISC Place 1 sensor on the skin every 14 days. Use to check glucose continuously 8 each 1   Cyanocobalamin  (B12 LIQUID HEALTH BOOSTER PO) Take by mouth.     EPINEPHrine  0.3 mg/0.3 mL IJ SOAJ injection AS DIRECTED FOR SYSTEMIC REACTIONS INJECTION 30 DAYS 1 each 1   ezetimibe  (ZETIA ) 10 MG tablet Take 1 tablet (10 mg total) by mouth daily. 90 tablet 3   glucose blood test strip Use as instructed 100 each 12   insulin  aspart protamine - aspart (NOVOLOG  MIX 70/30 FLEXPEN) (70-30) 100 UNIT/ML FlexPen Inject 30 Units into the skin 2 (two) times daily with a meal. 15 mL 2   Insulin  Pen Needle (B-D UF III MINI PEN NEEDLES) 31G X 5 MM MISC As directed 100 each 2   levocetirizine (XYZAL) 5 MG tablet Take 5 mg by mouth every evening.     mometasone  (ELOCON ) 0.1 % cream Appy bid 45 g 3   olmesartan -hydrochlorothiazide  (BENICAR  HCT) 20-12.5 MG tablet  Take 1 tablet by mouth daily. 90 tablet 3   ONETOUCH DELICA LANCETS MISC Check BS daily 100 each 1   triamcinolone  ointment (KENALOG ) 0.1 % Apply 1 Application topically 2 (two) times daily. Apply 2 times daily for 2 weeks on bug bites 453 g 5   tirzepatide  (MOUNJARO ) 2.5 MG/0.5ML Pen Inject 2.5 mg into the skin once a week. (Patient not taking: Reported on 10/16/2024) 2 mL 3   rosuvastatin  (CRESTOR ) 40 MG tablet Take 1 tablet (40 mg total) by mouth daily. (  Patient not taking: Reported on 10/16/2024) 90 tablet 1   No facility-administered medications prior to visit.    Allergies  Allergen Reactions   Levonorgestrel-Ethinyl Estrad Other (See Comments)   Metformin  Hcl Nausea Only    Review of Systems  Constitutional:  Negative for fever and malaise/fatigue.  HENT:  Negative for congestion.   Eyes:  Negative for blurred vision.  Respiratory:  Negative for shortness of breath.   Cardiovascular:  Negative for chest pain, palpitations and leg swelling.  Gastrointestinal:  Negative for abdominal pain, blood in stool and nausea.  Genitourinary:  Negative for dysuria and frequency.  Musculoskeletal:  Negative for falls.  Skin:  Negative for rash.  Neurological:  Negative for dizziness, loss of consciousness and headaches.  Endo/Heme/Allergies:  Negative for environmental allergies.  Psychiatric/Behavioral:  Negative for depression. The patient is not nervous/anxious.        Objective:    Physical Exam Vitals and nursing note reviewed.  Constitutional:      General: She is not in acute distress.    Appearance: Normal appearance. She is well-developed.  HENT:     Head: Normocephalic and atraumatic.     Right Ear: Tympanic membrane, ear canal and external ear normal. There is no impacted cerumen.     Left Ear: Tympanic membrane, ear canal and external ear normal. There is no impacted cerumen.     Nose: Nose normal.     Mouth/Throat:     Mouth: Mucous membranes are moist.     Pharynx:  Oropharynx is clear. No oropharyngeal exudate or posterior oropharyngeal erythema.  Eyes:     General: No scleral icterus.       Right eye: No discharge.        Left eye: No discharge.     Conjunctiva/sclera: Conjunctivae normal.     Pupils: Pupils are equal, round, and reactive to light.  Neck:     Thyroid : No thyromegaly or thyroid  tenderness.     Vascular: No JVD.  Cardiovascular:     Rate and Rhythm: Normal rate and regular rhythm.     Heart sounds: Normal heart sounds. No murmur heard. Pulmonary:     Effort: Pulmonary effort is normal. No respiratory distress.     Breath sounds: Normal breath sounds.  Abdominal:     General: Bowel sounds are normal. There is no distension.     Palpations: Abdomen is soft. There is no mass.     Tenderness: There is no abdominal tenderness. There is no guarding or rebound.  Musculoskeletal:        General: Normal range of motion.     Cervical back: Normal range of motion and neck supple.     Right lower leg: No edema.     Left lower leg: No edema.  Lymphadenopathy:     Cervical: No cervical adenopathy.  Skin:    General: Skin is warm and dry.     Findings: No erythema or rash.  Neurological:     Mental Status: She is alert and oriented to person, place, and time.     Cranial Nerves: No cranial nerve deficit.     Deep Tendon Reflexes: Reflexes are normal and symmetric.  Psychiatric:        Mood and Affect: Mood normal.        Behavior: Behavior normal.        Thought Content: Thought content normal.        Judgment: Judgment normal.     BP ROLLEN)  142/80 (BP Location: Right Arm, Patient Position: Sitting, Cuff Size: Large)   Pulse 100   Temp 98.4 F (36.9 C) (Oral)   Resp 18   Ht 5' 4 (1.626 m)   Wt 160 lb 9.6 oz (72.8 kg)   LMP 02/17/2018   SpO2 99%   BMI 27.57 kg/m  Wt Readings from Last 3 Encounters:  10/16/24 160 lb 9.6 oz (72.8 kg)  04/16/24 155 lb 12.8 oz (70.7 kg)  04/10/24 152 lb 3.2 oz (69 kg)    Diabetic Foot Exam -  Simple   No data filed    Lab Results  Component Value Date   WBC 4.3 10/17/2023   HGB 13.7 10/17/2023   HCT 43.1 10/17/2023   PLT 234.0 10/17/2023   GLUCOSE 300 (H) 10/17/2023   CHOL 193 04/10/2024   TRIG 70 04/10/2024   HDL 64 04/10/2024   LDLDIRECT 152.3 07/13/2013   LDLCALC 116 (H) 04/10/2024   ALT 14 10/17/2023   AST 13 10/17/2023   NA 137 10/17/2023   K 4.4 10/17/2023   CL 96 10/17/2023   CREATININE 1.04 10/17/2023   BUN 12 10/17/2023   CO2 32 10/17/2023   TSH 3.14 10/17/2023   HGBA1C 11.2 (H) 04/10/2024   MICROALBUR 2.7 (H) 04/16/2024    Lab Results  Component Value Date   TSH 3.14 10/17/2023   Lab Results  Component Value Date   WBC 4.3 10/17/2023   HGB 13.7 10/17/2023   HCT 43.1 10/17/2023   MCV 79.1 10/17/2023   PLT 234.0 10/17/2023   Lab Results  Component Value Date   NA 137 10/17/2023   K 4.4 10/17/2023   CO2 32 10/17/2023   GLUCOSE 300 (H) 10/17/2023   BUN 12 10/17/2023   CREATININE 1.04 10/17/2023   BILITOT 0.5 10/17/2023   ALKPHOS 85 10/17/2023   AST 13 10/17/2023   ALT 14 10/17/2023   PROT 7.1 10/17/2023   ALBUMIN 4.3 10/17/2023   CALCIUM  9.9 10/17/2023   GFR 59.60 (L) 10/17/2023   Lab Results  Component Value Date   CHOL 193 04/10/2024   Lab Results  Component Value Date   HDL 64 04/10/2024   Lab Results  Component Value Date   LDLCALC 116 (H) 04/10/2024   Lab Results  Component Value Date   TRIG 70 04/10/2024   Lab Results  Component Value Date   CHOLHDL 3.0 04/10/2024   Lab Results  Component Value Date   HGBA1C 11.2 (H) 04/10/2024       Assessment & Plan:  Preventative health care Assessment & Plan: Ghm utd Check labs  See AVS  Health Maintenance  Topic Date Due   Hepatitis B Vaccines 19-59 Average Risk (1 of 3 - 19+ 3-dose series) Never done   Cervical Cancer Screening (HPV/Pap Cotest)  10/08/2021   Pneumococcal Vaccine: 50+ Years (2 of 2 - PCV) 03/10/2022   Mammogram  10/18/2023   COVID-19 Vaccine  (5 - 2025-26 season) 08/31/2024   Diabetic kidney evaluation - eGFR measurement  10/16/2024   HEMOGLOBIN A1C  10/10/2024   FOOT EXAM  10/16/2024   Influenza Vaccine  03/30/2025 (Originally 07/31/2024)   OPHTHALMOLOGY EXAM  12/15/2024   Diabetic kidney evaluation - Urine ACR  04/16/2025   Fecal DNA (Cologuard)  11/06/2025   DTaP/Tdap/Td (4 - Td or Tdap) 04/12/2032   Hepatitis C Screening  Completed   HIV Screening  Completed   Zoster Vaccines- Shingrix   Completed   HPV VACCINES  Aged Out  Meningococcal B Vaccine  Aged Out      Type 2 diabetes mellitus with hyperglycemia, with long-term current use of insulin  (HCC) Assessment & Plan: Hgba1c to be checked,  minimize simple carbs. Increase exercise as tolerated. Continue current meds   Orders: -     Hemoglobin A1c  Hyperlipidemia associated with type 2 diabetes mellitus (HCC) Assessment & Plan: Encourage heart healthy diet such as MIND or DASH diet, increase exercise, avoid trans fats, simple carbohydrates and processed foods, consider a krill or fish or flaxseed oil cap daily.    Orders: -     Comprehensive metabolic panel with GFR -     Lipid panel -     TSH  Essential hypertension Assessment & Plan: Well controlled, no changes to meds. Encouraged heart healthy diet such as the DASH diet and exercise as tolerated.    Orders: -     CBC with Differential/Platelet -     Comprehensive metabolic panel with GFR -     Lipid panel -     TSH  B12 deficiency -     Vitamin B12    Derril Franek R Lowne Chase, DO

## 2024-10-16 NOTE — Assessment & Plan Note (Signed)
 Ghm utd Check labs  See AVS  Health Maintenance  Topic Date Due   Hepatitis B Vaccines 19-59 Average Risk (1 of 3 - 19+ 3-dose series) Never done   Cervical Cancer Screening (HPV/Pap Cotest)  10/08/2021   Pneumococcal Vaccine: 50+ Years (2 of 2 - PCV) 03/10/2022   Mammogram  10/18/2023   COVID-19 Vaccine (5 - 2025-26 season) 08/31/2024   Diabetic kidney evaluation - eGFR measurement  10/16/2024   HEMOGLOBIN A1C  10/10/2024   FOOT EXAM  10/16/2024   Influenza Vaccine  03/30/2025 (Originally 07/31/2024)   OPHTHALMOLOGY EXAM  12/15/2024   Diabetic kidney evaluation - Urine ACR  04/16/2025   Fecal DNA (Cologuard)  11/06/2025   DTaP/Tdap/Td (4 - Td or Tdap) 04/12/2032   Hepatitis C Screening  Completed   HIV Screening  Completed   Zoster Vaccines- Shingrix   Completed   HPV VACCINES  Aged Out   Meningococcal B Vaccine  Aged Out

## 2024-10-16 NOTE — Assessment & Plan Note (Signed)
 Well controlled, no changes to meds. Encouraged heart healthy diet such as the DASH diet and exercise as tolerated.

## 2024-10-18 ENCOUNTER — Ambulatory Visit: Payer: Self-pay | Admitting: Family Medicine

## 2024-10-18 DIAGNOSIS — E1165 Type 2 diabetes mellitus with hyperglycemia: Secondary | ICD-10-CM

## 2024-10-26 ENCOUNTER — Ambulatory Visit: Admitting: Family Medicine

## 2024-11-03 ENCOUNTER — Ambulatory Visit: Admitting: Family Medicine

## 2024-11-03 ENCOUNTER — Encounter: Payer: Self-pay | Admitting: Family Medicine

## 2024-11-03 VITALS — BP 140/78 | HR 110 | Temp 98.2°F | Resp 16 | Ht 64.0 in | Wt 157.2 lb

## 2024-11-03 DIAGNOSIS — H6123 Impacted cerumen, bilateral: Secondary | ICD-10-CM | POA: Diagnosis not present

## 2024-11-03 NOTE — Assessment & Plan Note (Signed)
 Use debrox as needed Use tissue wick to soak up wet wax Return to office as needed

## 2024-11-03 NOTE — Progress Notes (Signed)
 Subjective:    Patient ID: Luke Fritter, female    DOB: 1966/03/15, 58 y.o.   MRN: [id number]  Chief Complaint  Patient presents with   Ear popping    HPI Patient is in today for ear irrigation.  Discussed the use of AI scribe software for clinical note transcription with the patient, who gave verbal consent to proceed.  History of Present Illness Maham Quintin is a 58 year old female who presents with earwax buildup and emotional distress following her father's recent death.  She experiences significant earwax buildup in both ears, with a sensation of fullness and a 'squish, squish, squish' sound when lying on her side.  She is experiencing emotional distress following the death of her father on November 19, 2025whom she buried the previous Saturday. She feels sad and depressed, has not returned to work since his passing, and her ability to work depends on how she feels upon waking. Frequent phone calls from concerned individuals disturb her sleep.  A close family friend, considered like a 'big brother', is in hospice care with lung cancer, adding to her emotional burden. She reflects on the rapid decline of her husband, who passed away from kidney cancer shortly after diagnosis, and her father's health issues, including a stroke following a toe amputation and subsequent seizures leading to his death.  Her family history includes her mother having breast cancer and a kidney tumor, and her brother having had lung cancer. She has undergone genetic testing, which did not show a predisposition to cancer, but she remains concerned about the potential impact of medications and environmental factors on health.  She was previously on Mounjaro  but stopped due to concerns about unintended weight loss and potential side effects. She notes a recent unintentional weight loss of four pounds, likely due to  stress and not eating properly.    Past Medical History:  Diagnosis Date   Abortion history 1995   [id number]   Allergic rhinitis    Asthma    Diabetes mellitus type II    Gestational diabetes mellitus 2001   Hyperlipidemia    Hypertension     Past Surgical History:  Procedure Laterality Date   CERVICAL BIOPSY  2000   TUBAL LIGATION  2001    Family History  Problem Relation Age of Onset   Hypertension Mother    Arthritis Mother    Breast cancer Mother 37   Cancer Mother        breast   Diabetes Mother    Hyperlipidemia Mother    Stroke Father    Diabetes Father    Hyperlipidemia Father    Hyperlipidemia Sister    Crohn's disease Sister        twin   Hypertension Maternal Grandmother    Arthritis Maternal Grandmother    Heart disease Maternal Grandfather 73       mi   Hypertension Maternal Aunt    Breast cancer Maternal Aunt    Diabetes Paternal Aunt    Breast cancer Cousin    Cancer Cousin    Asthma Other        children    Social History   Socioeconomic History   Marital status: Widowed    Spouse name: Not on file   Number of children: 2  Years of education: Not on file   Highest education level: Not on file  Occupational History   Occupation: CLIENT SER REP    Employer: [employer]   Occupation: optician, dispensing    Comment: [employer]  Tobacco Use   Smoking status: Never   Smokeless tobacco: Never  Substance and Sexual Activity   Alcohol use: No    Comment: rare   Drug use: No   Sexual activity: Yes    Partners: Male  Other Topics Concern   Not on file  Social History Narrative   Exercise-- walks in am   Social Drivers of Health   Financial Resource Strain: Not on file  Food Insecurity: No Food Insecurity (02/07/2024)   Hunger Vital Sign    Worried About Running Out of Food in the Last Year: Never true    Ran Out of Food in the Last Year: Never true  Transportation Needs: No Transportation Needs (02/07/2024)   PRAPARE -  Administrator, Civil Service (Medical): No    Lack of Transportation (Non-Medical): No  Physical Activity: Not on file  Stress: Not on file  Social Connections: Not on file  Intimate Partner Violence: Not At Risk (02/07/2024)   Humiliation, Afraid, Rape, and Kick questionnaire    Fear of Current or Ex-Partner: No    Emotionally Abused: No    Physically Abused: No    Sexually Abused: No    Outpatient Medications Prior to Visit  Medication Sig Dispense Refill   ciclopirox  (PENLAC ) 8 % solution Apply topically daily. 6.6 mL 2   clindamycin  (CLEOCIN  T) 1 % lotion APPLY TOPICALLY TWICE A DAY 60 mL 0   Continuous Glucose Sensor (FREESTYLE LIBRE 3 SENSOR) MISC Place 1 sensor on the skin every 14 days. Use to check glucose continuously 8 each 1   Cyanocobalamin  (B12 LIQUID HEALTH BOOSTER PO) Take by mouth.     EPINEPHrine  0.3 mg/0.3 mL IJ SOAJ injection AS DIRECTED FOR SYSTEMIC REACTIONS INJECTION 30 DAYS 1 each 1   glucose blood test strip Use as instructed 100 each 12   insulin  aspart protamine - aspart (NOVOLOG  MIX 70/30 FLEXPEN) (70-30) 100 UNIT/ML FlexPen Inject 30 Units into the skin 2 (two) times daily with a meal. 15 mL 2   Insulin  Pen Needle (B-D UF III MINI PEN NEEDLES) 31G X 5 MM MISC As directed 100 each 2   levocetirizine (XYZAL) 5 MG tablet Take 5 mg by mouth every evening.     mometasone  (ELOCON ) 0.1 % cream Appy bid 45 g 3   olmesartan -hydrochlorothiazide  (BENICAR  HCT) 20-12.5 MG tablet Take 1 tablet by mouth daily. 90 tablet 3   ONETOUCH DELICA LANCETS MISC Check BS daily 100 each 1   triamcinolone  ointment (KENALOG ) 0.1 % Apply 1 Application topically 2 (two) times daily. Apply 2 times daily for 2 weeks on bug bites 453 g 5   ezetimibe  (ZETIA ) 10 MG tablet Take 1 tablet (10 mg total) by mouth daily. (Patient not taking: Reported on 11/03/2024) 90 tablet 3   tirzepatide  (MOUNJARO ) 2.5 MG/0.5ML Pen Inject 2.5 mg into the skin once a week. (Patient not taking: Reported  on 11/03/2024) 2 mL 3   No facility-administered medications prior to visit.    Allergies  Allergen Reactions   Levonorgestrel-Ethinyl Estrad Other (See Comments)   Metformin  Hcl Nausea Only    Review of Systems  Constitutional:  Negative for fever and malaise/fatigue.  HENT:  Positive for hearing loss. Negative for congestion and sore  throat.   Eyes:  Negative for blurred vision.  Respiratory:  Negative for cough and shortness of breath.   Cardiovascular:  Negative for chest pain, palpitations and leg swelling.  Gastrointestinal:  Negative for vomiting.  Musculoskeletal:  Negative for back pain.  Skin:  Negative for rash.  Neurological:  Negative for loss of consciousness and headaches.       Objective:    Physical Exam Vitals and nursing note reviewed.  Constitutional:      General: She is not in acute distress.    Appearance: Normal appearance. She is well-developed.  HENT:     Head: Normocephalic and atraumatic.     Right Ear: There is impacted cerumen.     Left Ear: There is impacted cerumen.     Ears:     Comments: + cerumen impaction----  unable to remove with hoop.  Pt ears irrigated successfully  TMI , no erythema, canal normal  Eyes:     General: No scleral icterus.       Right eye: No discharge.        Left eye: No discharge.  Cardiovascular:     Rate and Rhythm: Normal rate and regular rhythm.     Heart sounds: No murmur heard. Pulmonary:     Effort: Pulmonary effort is normal. No respiratory distress.     Breath sounds: Normal breath sounds.  Musculoskeletal:        General: Normal range of motion.     Cervical back: Normal range of motion and neck supple.     Right lower leg: No edema.     Left lower leg: No edema.  Skin:    General: Skin is warm and dry.  Neurological:     Mental Status: She is alert and oriented to person, place, and time.  Psychiatric:        Mood and Affect: Mood normal.        Behavior: Behavior normal.        Thought  Content: Thought content normal.        Judgment: Judgment normal.     BP (!) 140/78 (BP Location: Left Arm, Patient Position: Sitting, Cuff Size: Normal)   Pulse (!) 110   Temp 98.2 F (36.8 C) (Oral)   Resp 16   Ht 5' 4 (1.626 m)   Wt 157 lb 3.2 oz (71.3 kg)   LMP 02/17/2018   SpO2 97%   BMI 26.98 kg/m  Wt Readings from Last 3 Encounters:  11/03/24 157 lb 3.2 oz (71.3 kg)  10/16/24 160 lb 9.6 oz (72.8 kg)  04/16/24 155 lb 12.8 oz (70.7 kg)    Diabetic Foot Exam - Simple   No data filed    Lab Results  Component Value Date   WBC 4.7 10/16/2024   HGB 13.5 10/16/2024   HCT 41.3 10/16/2024   PLT 231.0 10/16/2024   GLUCOSE 177 (H) 10/16/2024   CHOL 276 (H) 10/16/2024   TRIG 124.0 10/16/2024   HDL 55.20 10/16/2024   LDLDIRECT 152.3 07/13/2013   LDLCALC 196 (H) 10/16/2024   ALT 17 10/16/2024   AST 14 10/16/2024   NA 139 10/16/2024   K 4.1 10/16/2024   CL 99 10/16/2024   CREATININE 1.10 10/16/2024   BUN 18 10/16/2024   CO2 32 10/16/2024   TSH 1.59 10/16/2024   HGBA1C 11.5 (H) 10/16/2024   MICROALBUR 2.7 (H) 04/16/2024    Lab Results  Component Value Date   TSH 1.59 10/16/2024  Lab Results  Component Value Date   WBC 4.7 10/16/2024   HGB 13.5 10/16/2024   HCT 41.3 10/16/2024   MCV 76.6 (L) 10/16/2024   PLT 231.0 10/16/2024   Lab Results  Component Value Date   NA 139 10/16/2024   K 4.1 10/16/2024   CO2 32 10/16/2024   GLUCOSE 177 (H) 10/16/2024   BUN 18 10/16/2024   CREATININE 1.10 10/16/2024   BILITOT 0.3 10/16/2024   ALKPHOS 85 10/16/2024   AST 14 10/16/2024   ALT 17 10/16/2024   PROT 7.0 10/16/2024   ALBUMIN 4.3 10/16/2024   CALCIUM  9.5 10/16/2024   GFR 55.33 (L) 10/16/2024   Lab Results  Component Value Date   CHOL 276 (H) 10/16/2024   Lab Results  Component Value Date   HDL 55.20 10/16/2024   Lab Results  Component Value Date   LDLCALC 196 (H) 10/16/2024   Lab Results  Component Value Date   TRIG 124.0 10/16/2024   Lab  Results  Component Value Date   CHOLHDL 5 10/16/2024   Lab Results  Component Value Date   HGBA1C 11.5 (H) 10/16/2024       Assessment & Plan:  Bilateral impacted cerumen Assessment & Plan: Use debrox as needed Use tissue wick to soak up wet wax Return to office as needed     Assessment and Plan Assessment & Plan Impacted cerumen, bilateral   Bilateral impacted cerumen is present. Both ears were irrigated to remove the impacted cerumen.  Depression   She is experiencing depression following her father's recent death and ongoing stressors, including a family member's terminal illness. She reports feeling sad and depressed, particularly in the mornings, and has not returned to work due to these feelings. Sleep is disrupted by constant phone calls and stress. Encouraged to follow up with hospice for counseling support.   Betsey Sossamon R Lowne Chase, DO

## 2024-11-23 DIAGNOSIS — F4322 Adjustment disorder with anxiety: Secondary | ICD-10-CM | POA: Diagnosis not present

## 2024-12-07 DIAGNOSIS — H524 Presbyopia: Secondary | ICD-10-CM | POA: Diagnosis not present

## 2024-12-07 DIAGNOSIS — E119 Type 2 diabetes mellitus without complications: Secondary | ICD-10-CM | POA: Diagnosis not present

## 2025-04-16 ENCOUNTER — Ambulatory Visit: Admitting: Family Medicine

## 2025-05-03 ENCOUNTER — Ambulatory Visit: Admitting: "Endocrinology
